# Patient Record
Sex: Female | Born: 1950
Health system: Southern US, Community
[De-identification: ages and names within clinical notes are randomized; demographics above are authoritative.]

## PROBLEM LIST (undated history)

## (undated) DIAGNOSIS — M8700 Idiopathic aseptic necrosis of unspecified bone: Secondary | ICD-10-CM

## (undated) DIAGNOSIS — L97509 Non-pressure chronic ulcer of other part of unspecified foot with unspecified severity: Secondary | ICD-10-CM

## (undated) DIAGNOSIS — D649 Anemia, unspecified: Secondary | ICD-10-CM

## (undated) DIAGNOSIS — M19071 Primary osteoarthritis, right ankle and foot: Secondary | ICD-10-CM

## (undated) DIAGNOSIS — H25013 Cortical age-related cataract, bilateral: Secondary | ICD-10-CM

## (undated) DIAGNOSIS — I2510 Atherosclerotic heart disease of native coronary artery without angina pectoris: Secondary | ICD-10-CM

## (undated) DIAGNOSIS — I10 Essential (primary) hypertension: Secondary | ICD-10-CM

## (undated) DIAGNOSIS — G9340 Encephalopathy, unspecified: Secondary | ICD-10-CM

## (undated) DIAGNOSIS — T8859XA Other complications of anesthesia, initial encounter: Secondary | ICD-10-CM

## (undated) DIAGNOSIS — C50919 Malignant neoplasm of unspecified site of unspecified female breast: Secondary | ICD-10-CM

## (undated) DIAGNOSIS — J42 Unspecified chronic bronchitis: Secondary | ICD-10-CM

## (undated) DIAGNOSIS — Z9889 Other specified postprocedural states: Secondary | ICD-10-CM

## (undated) DIAGNOSIS — Z720 Tobacco use: Secondary | ICD-10-CM

## (undated) DIAGNOSIS — R609 Edema, unspecified: Secondary | ICD-10-CM

## (undated) DIAGNOSIS — J45909 Unspecified asthma, uncomplicated: Secondary | ICD-10-CM

## (undated) DIAGNOSIS — R911 Solitary pulmonary nodule: Secondary | ICD-10-CM

## (undated) DIAGNOSIS — L03031 Cellulitis of right toe: Secondary | ICD-10-CM

## (undated) DIAGNOSIS — A499 Bacterial infection, unspecified: Secondary | ICD-10-CM

## (undated) DIAGNOSIS — H50611 Brown's sheath syndrome, right eye: Secondary | ICD-10-CM

## (undated) DIAGNOSIS — R7303 Prediabetes: Secondary | ICD-10-CM

## (undated) DIAGNOSIS — I739 Peripheral vascular disease, unspecified: Secondary | ICD-10-CM

## (undated) DIAGNOSIS — N200 Calculus of kidney: Secondary | ICD-10-CM

## (undated) DIAGNOSIS — G629 Polyneuropathy, unspecified: Secondary | ICD-10-CM

## (undated) DIAGNOSIS — E785 Hyperlipidemia, unspecified: Secondary | ICD-10-CM

## (undated) DIAGNOSIS — R112 Nausea with vomiting, unspecified: Secondary | ICD-10-CM

## (undated) DIAGNOSIS — M2010 Hallux valgus (acquired), unspecified foot: Secondary | ICD-10-CM

## (undated) DIAGNOSIS — M8580 Other specified disorders of bone density and structure, unspecified site: Secondary | ICD-10-CM

## (undated) DIAGNOSIS — I493 Ventricular premature depolarization: Secondary | ICD-10-CM

## (undated) DIAGNOSIS — G5600 Carpal tunnel syndrome, unspecified upper limb: Secondary | ICD-10-CM

## (undated) DIAGNOSIS — N390 Urinary tract infection, site not specified: Secondary | ICD-10-CM

## (undated) DIAGNOSIS — J449 Chronic obstructive pulmonary disease, unspecified: Secondary | ICD-10-CM

## (undated) DIAGNOSIS — R7611 Nonspecific reaction to tuberculin skin test without active tuberculosis: Secondary | ICD-10-CM

## (undated) DIAGNOSIS — M2042 Other hammer toe(s) (acquired), left foot: Secondary | ICD-10-CM

## (undated) HISTORY — PX: HIP FRACTURE SURGERY: SHX118

## (undated) HISTORY — DX: Malignant neoplasm of unspecified site of unspecified female breast: C50.919

## (undated) HISTORY — DX: Bacterial infection, unspecified: A49.9

## (undated) HISTORY — PX: DIAGNOSTIC LAPAROSCOPY: SUR761

---
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
## 1993-12-22 HISTORY — PX: OTHER SURGICAL HISTORY: SHX169

## 2000-12-22 HISTORY — PX: BONE EXOSTOSIS EXCISION: SHX1249

## 2001-12-22 DIAGNOSIS — C50919 Malignant neoplasm of unspecified site of unspecified female breast: Secondary | ICD-10-CM

## 2001-12-22 HISTORY — PX: BREAST LUMPECTOMY WITH SENTINEL LYMPH NODE BIOPSY: SHX5597

## 2001-12-22 HISTORY — DX: Malignant neoplasm of unspecified site of unspecified female breast: C50.919

## 2001-12-22 HISTORY — PX: MASTECTOMY, PARTIAL: SHX709

## 2004-12-22 HISTORY — PX: ROTATOR CUFF REPAIR: SHX139

## 2006-10-15 ENCOUNTER — Other Ambulatory Visit: Payer: Self-pay

## 2006-10-15 ENCOUNTER — Emergency Department: Payer: Self-pay | Admitting: Emergency Medicine

## 2008-06-11 IMAGING — CR DG RIBS 2V*R*
1 series · 5 of 5 positions shown · non-contrast
Comparison: none

REASON FOR EXAM: Pain x 3 weeks
COMMENTS:

[Series 1: view not recorded · 0.17mm/px · 5 of 5 slices shown]
[im 1/5]
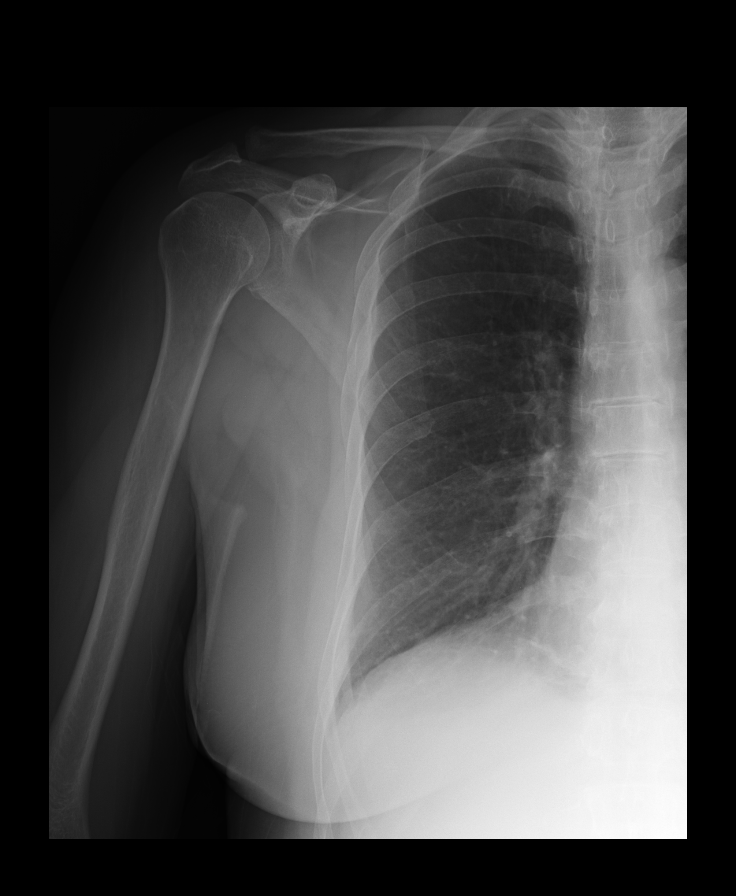
[im 2/5]
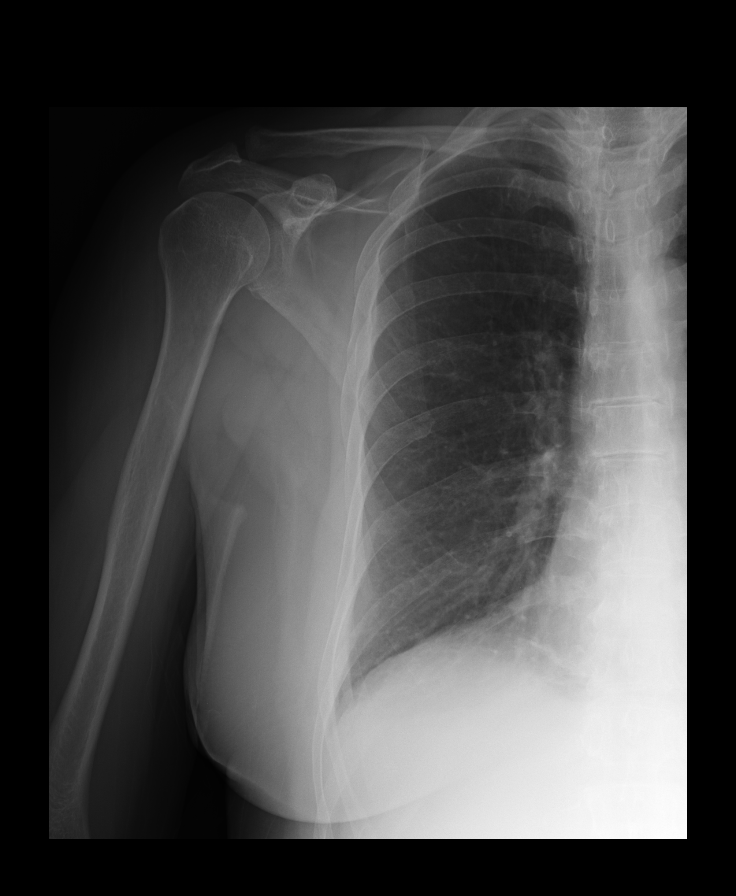
[im 3/5]
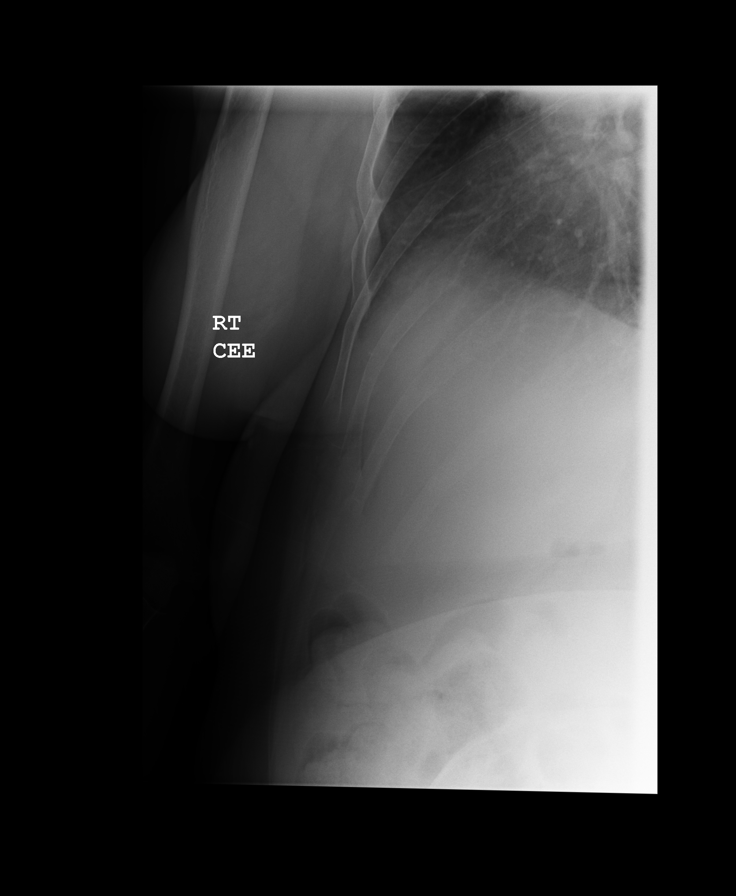
[im 4/5]
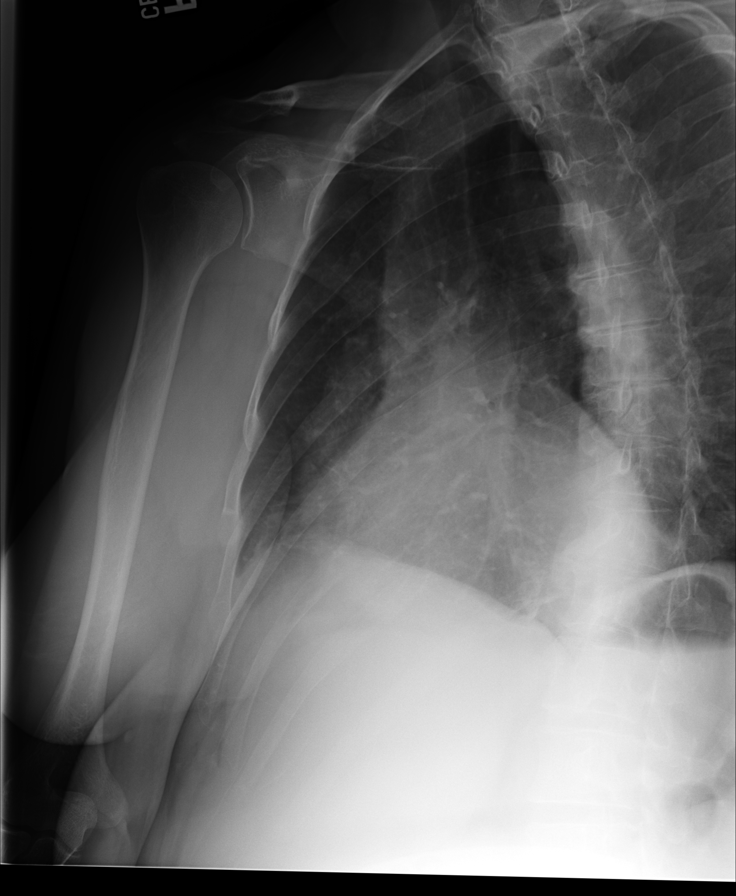
[im 5/5]
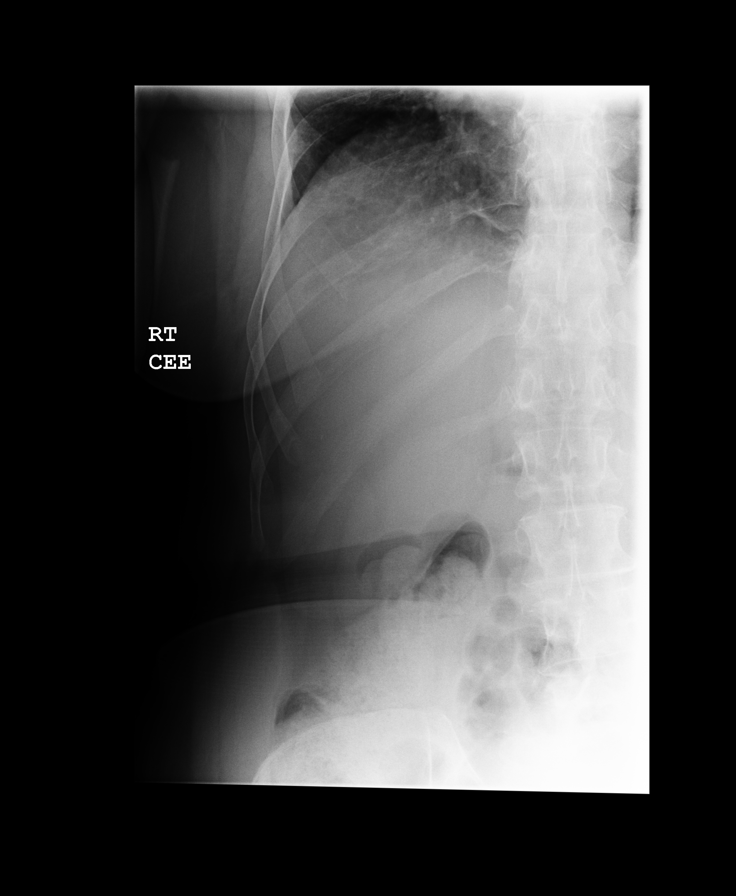

[5 of 5 positions shown; findings below may reference images not displayed]

PROCEDURE:     DXR - DXR RIBS RIGHT UNILATERAL  - October 15, 2006  [DATE]

RESULT:        Four views of the RIGHT ribs were obtained.  There is
deformity of the fifth RIGHT rib laterally.  The appearance is suspicious
for a fracture but could be old.  This is seen in only one view.
Correlation with clinical findings is needed.  No pneumothorax is observed.
The RIGHT ribs otherwise are normal in appearance.
IMPRESSION: Possible fracture of the fifth RIGHT rib.

## 2008-12-22 HISTORY — PX: ROTATOR CUFF REPAIR: SHX139

## 2013-05-11 HISTORY — PX: GANGLION CYST EXCISION: SHX1691

## 2013-08-16 HISTORY — PX: COLONOSCOPY: SHX174

## 2015-02-28 HISTORY — PX: TOE AMPUTATION: SHX809

## 2015-12-23 HISTORY — PX: ANKLE FUSION: SHX881

## 2015-12-27 HISTORY — PX: ANKLE FUSION: SHX881

## 2016-01-23 HISTORY — PX: CATARACT EXTRACTION W/ INTRAOCULAR LENS IMPLANT: SHX1309

## 2016-02-06 HISTORY — PX: CATARACT EXTRACTION W/ INTRAOCULAR LENS IMPLANT: SHX1309

## 2017-03-23 HISTORY — PX: COLONOSCOPY: SHX174

## 2017-08-26 ENCOUNTER — Ambulatory Visit
Admission: EM | Admit: 2017-08-26 | Discharge: 2017-08-26 | Disposition: A | Discharge status: Home / Self Care | Payer: Medicare Other | Attending: Family Medicine | Admitting: Family Medicine

## 2017-08-26 DIAGNOSIS — R319 Hematuria, unspecified: Secondary | ICD-10-CM | POA: Insufficient documentation

## 2017-08-26 DIAGNOSIS — Z87440 Personal history of urinary (tract) infections: Secondary | ICD-10-CM | POA: Insufficient documentation

## 2017-08-26 DIAGNOSIS — Z880 Allergy status to penicillin: Secondary | ICD-10-CM | POA: Diagnosis not present

## 2017-08-26 DIAGNOSIS — F17210 Nicotine dependence, cigarettes, uncomplicated: Secondary | ICD-10-CM | POA: Insufficient documentation

## 2017-08-26 DIAGNOSIS — Z79899 Other long term (current) drug therapy: Secondary | ICD-10-CM | POA: Diagnosis not present

## 2017-08-26 DIAGNOSIS — N390 Urinary tract infection, site not specified: Secondary | ICD-10-CM | POA: Insufficient documentation

## 2017-08-26 HISTORY — DX: Idiopathic aseptic necrosis of unspecified bone: M87.00

## 2017-08-26 HISTORY — DX: Urinary tract infection, site not specified: N39.0

## 2017-08-26 HISTORY — DX: Unspecified asthma, uncomplicated: J45.909

## 2017-08-26 LAB — URINALYSIS, COMPLETE (UACMP) WITH MICROSCOPIC

## 2017-08-26 MED ORDER — SULFAMETHOXAZOLE-TRIMETHOPRIM 800-160 MG PO TABS: 1.0000 | ORAL_TABLET | Freq: Two times a day (BID) | ORAL | 0 refills | Status: DC

## 2017-08-26 NOTE — ED Provider Notes (Signed)
[facility]    CSN: [id number] Arrival date & time:[id number] 08/26/17  1031     History   Chief Complaint Chief Complaint  Patient presents with  . Hematuria    HPI [name] is a 66 y.o. female.   The history is provided by the patient.  Hematuria  This is a new problem. The current episode started more than 2 days ago. The problem occurs constantly. The problem has not changed since onset.Pertinent negatives include no chest pain, no abdominal pain, no headaches and no shortness of breath. Associated symptoms comments: Denies fevers, chills, pain, vomiting.    Past Medical History:  Diagnosis Date  . Asthma   . 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 Frequent UTI     There are no active problems to display for this patient.   Past Surgical History:  Procedure Laterality Date  . ANKLE FUSION Right 2017  . HIP FRACTURE SURGERY Bilateral   . MASTECTOMY, PARTIAL Left 2003  . ROTATOR CUFF REPAIR Left    X 2  . TONSILLECTOMY      OB History    No data available       Home Medications    Prior to Admission medications   Medication Sig Start Date End Date Taking? Authorizing Provider  acetaminophen (TYLENOL) 500 MG tablet Take 1,000 mg by mouth every 6 (six) hours as needed.   Yes [provider]  albuterol (PROVENTIL HFA;VENTOLIN HFA) 108 (90 Base) MCG/ACT inhaler Inhale into the lungs every 6 (six) hours as needed for wheezing or shortness of breath.   Yes [provider]  budesonide-formoterol (SYMBICORT) 160-4.5 MCG/ACT inhaler Inhale 2 puffs into the lungs 2 (two) times daily.   Yes [provider]  montelukast (SINGULAIR) 10 MG tablet Take 10 mg by mouth at bedtime.   Yes [provider]  Multiple Vitamin (MULTIVITAMIN WITH MINERALS) TABS tablet Take 1 tablet by mouth daily.   Yes [provider]  naproxen (NAPROSYN) 500 MG tablet Take 500 mg by mouth 2 (two) times daily with a meal.    Yes [provider]  tiotropium (SPIRIVA) 18 MCG inhalation capsule Place 18 mcg into inhaler and inhale daily.   Yes [provider]  venlafaxine XR (EFFEXOR-XR) 150 MG 24 hr capsule Take 150 mg by mouth daily with breakfast.   Yes [provider]  sulfamethoxazole-trimethoprim (BACTRIM DS,SEPTRA DS) 800-160 MG tablet Take 1 tablet by mouth 2 (two) times daily. 08/26/17   Payton Mccallumonty, Florencio Hollibaugh, MD    Family History Family History  Problem Relation Age of Onset  . Adopted: Yes    Social History Social History  Substance Use Topics  . Smoking status: Current Every Day Smoker    Packs/day: 1.00  . Smokeless tobacco: Never Used  . Alcohol use No     Allergies   Oxycodone; Penicillins; and Vicodin [hydrocodone-acetaminophen]   Review of Systems Review of Systems  Respiratory: Negative for shortness of breath.   Cardiovascular: Negative for chest pain.  Gastrointestinal: Negative for abdominal pain.  Genitourinary: Positive for hematuria.  Neurological: Negative for headaches.     Physical Exam Triage Vital Signs ED Triage Vitals  Enc Vitals Group     BP 08/26/17 1155 (!) 153/96     Pulse Rate 08/26/17 1155 73     Resp 08/26/17 1155 18     Temp 08/26/17 1155 97.8 F (36.6 C)     Temp Source 08/26/17 1155  Oral     SpO2 08/26/17 1155 98 %     Weight 08/26/17 1154 153 lb (69.4 kg)     Height 08/26/17 1154 5\' 3"  (1.6 m)     Head Circumference --      Peak Flow --      Pain Score 08/26/17 1154 0     Pain Loc --      Pain Edu? --      Excl. in GC? --    No data found.   Updated Vital Signs BP (!) 153/96 (BP Location: Left Arm)   Pulse 73   Temp 97.8 F (36.6 C) (Oral)   Resp 18   Ht 5\' 3"  (1.6 m)   Wt 153 lb (69.4 kg)   SpO2 98%   BMI 27.10 kg/m   Visual Acuity Right Eye Distance:   Left Eye Distance:   Bilateral Distance:    Right Eye Near:   Left Eye Near:    Bilateral Near:     Physical Exam  Constitutional: She appears  well-developed and well-nourished. No distress.  Abdominal: Soft. Bowel sounds are normal. She exhibits no distension and no mass. There is no tenderness. There is no rebound and no guarding. No hernia.  Skin: She is not diaphoretic.  Nursing note and vitals reviewed.    UC Treatments / Results  Labs (all labs ordered are listed, but only abnormal results are displayed) Labs Reviewed  URINALYSIS, COMPLETE (UACMP) WITH MICROSCOPIC - Abnormal; Notable for the following:       Result Value   Color, Urine RED (*)    APPearance CLOUDY (*)    Glucose, UA   (*)    Value: TEST NOT REPORTED DUE TO COLOR INTERFERENCE OF URINE PIGMENT   Hgb urine dipstick   (*)    Value: TEST NOT REPORTED DUE TO COLOR INTERFERENCE OF URINE PIGMENT   Bilirubin Urine   (*)    Value: TEST NOT REPORTED DUE TO COLOR INTERFERENCE OF URINE PIGMENT   Ketones, ur   (*)    Value: TEST NOT REPORTED DUE TO COLOR INTERFERENCE OF URINE PIGMENT   Protein, ur   (*)    Value: TEST NOT REPORTED DUE TO COLOR INTERFERENCE OF URINE PIGMENT   Nitrite   (*)    Value: TEST NOT REPORTED DUE TO COLOR INTERFERENCE OF URINE PIGMENT   Leukocytes, UA   (*)    Value: TEST NOT REPORTED DUE TO COLOR INTERFERENCE OF URINE PIGMENT   Squamous Epithelial / LPF 0-5 (*)    Bacteria, UA FEW (*)    All other components within normal limits  URINE CULTURE    EKG  EKG Interpretation None       Radiology No results found.  Procedures Procedures (including critical care time)  Medications Ordered in UC Medications - No data to display   Initial Impression / Assessment and Plan / UC Course  I have reviewed the triage vital signs and the nursing notes.  Pertinent labs & imaging results that were available during my care of the patient were reviewed by me and considered in my medical decision making (see chart for details).       Final Clinical Impressions(s) / UC Diagnoses   Final diagnoses:  Urinary tract infection with  hematuria, site unspecified    New Prescriptions Discharge Medication List as of 08/26/2017 12:48 PM    START taking these medications   Details  sulfamethoxazole-trimethoprim (BACTRIM DS,SEPTRA DS) 800-160 MG tablet Take  1 tablet by mouth 2 (two) times daily., Starting Wed 08/26/2017, Normal       1.Lab results and diagnosis reviewed with patient 2. rx as per orders above; reviewed possible side effects, interactions, risks and benefits  3. Recommend supportive treatment with increased fluids 4. Follow-up prn if symptoms worsen or don't improve  Controlled Substance Prescriptions [place] Controlled Substance Registry consulted? Not Applicable   Payton Mccallum, MD 08/26/17 1550

## 2017-08-26 NOTE — ED Triage Notes (Signed)
Patient complains of hematuria that started 3-4 days ago. Patient states that she has not had any pain or frequency.

## 2017-12-22 DIAGNOSIS — I214 Non-ST elevation (NSTEMI) myocardial infarction: Secondary | ICD-10-CM

## 2017-12-22 HISTORY — DX: Non-ST elevation (NSTEMI) myocardial infarction: I21.4

## 2018-11-21 DIAGNOSIS — A419 Sepsis, unspecified organism: Secondary | ICD-10-CM

## 2018-11-21 HISTORY — DX: Sepsis, unspecified organism: A41.9

## 2018-12-19 HISTORY — PX: CYSTOURETHROSCOPY: SHX476

## 2019-02-28 HISTORY — PX: CARDIAC CATHETERIZATION: SHX172

## 2023-01-06 ENCOUNTER — Inpatient Hospital Stay: Payer: Medicare Other

## 2023-01-06 ENCOUNTER — Encounter (HOSPITAL_COMMUNITY): Payer: Self-pay

## 2023-01-06 ENCOUNTER — Ambulatory Visit (INDEPENDENT_AMBULATORY_CARE_PROVIDER_SITE_OTHER): Payer: Medicare Other | Admitting: Podiatry

## 2023-01-06 ENCOUNTER — Inpatient Hospital Stay
Admission: AD | Admit: 2023-01-06 | Discharge: 2023-01-12 | DRG: 253 | Disposition: A | Discharge status: Home / Self Care | Payer: Medicare Other | Source: Ambulatory Visit | Attending: Internal Medicine | Admitting: Internal Medicine

## 2023-01-06 ENCOUNTER — Ambulatory Visit (INDEPENDENT_AMBULATORY_CARE_PROVIDER_SITE_OTHER): Payer: Medicare Other

## 2023-01-06 ENCOUNTER — Encounter: Payer: Self-pay | Admitting: Family Medicine

## 2023-01-06 ENCOUNTER — Other Ambulatory Visit: Payer: Self-pay

## 2023-01-06 DIAGNOSIS — J4489 Other specified chronic obstructive pulmonary disease: Secondary | ICD-10-CM | POA: Diagnosis present

## 2023-01-06 DIAGNOSIS — E785 Hyperlipidemia, unspecified: Secondary | ICD-10-CM | POA: Diagnosis not present

## 2023-01-06 DIAGNOSIS — Z923 Personal history of irradiation: Secondary | ICD-10-CM

## 2023-01-06 DIAGNOSIS — L03031 Cellulitis of right toe: Secondary | ICD-10-CM

## 2023-01-06 DIAGNOSIS — L97519 Non-pressure chronic ulcer of other part of right foot with unspecified severity: Secondary | ICD-10-CM | POA: Diagnosis not present

## 2023-01-06 DIAGNOSIS — L02611 Cutaneous abscess of right foot: Secondary | ICD-10-CM | POA: Diagnosis not present

## 2023-01-06 DIAGNOSIS — Z853 Personal history of malignant neoplasm of breast: Secondary | ICD-10-CM | POA: Diagnosis not present

## 2023-01-06 DIAGNOSIS — L03115 Cellulitis of right lower limb: Secondary | ICD-10-CM | POA: Diagnosis present

## 2023-01-06 DIAGNOSIS — F17210 Nicotine dependence, cigarettes, uncomplicated: Secondary | ICD-10-CM | POA: Diagnosis present

## 2023-01-06 DIAGNOSIS — Z880 Allergy status to penicillin: Secondary | ICD-10-CM

## 2023-01-06 DIAGNOSIS — J449 Chronic obstructive pulmonary disease, unspecified: Secondary | ICD-10-CM | POA: Diagnosis not present

## 2023-01-06 DIAGNOSIS — Z7902 Long term (current) use of antithrombotics/antiplatelets: Secondary | ICD-10-CM

## 2023-01-06 DIAGNOSIS — I70235 Atherosclerosis of native arteries of right leg with ulceration of other part of foot: Principal | ICD-10-CM | POA: Diagnosis present

## 2023-01-06 DIAGNOSIS — I2510 Atherosclerotic heart disease of native coronary artery without angina pectoris: Secondary | ICD-10-CM | POA: Diagnosis present

## 2023-01-06 DIAGNOSIS — Z87440 Personal history of urinary (tract) infections: Secondary | ICD-10-CM

## 2023-01-06 DIAGNOSIS — I739 Peripheral vascular disease, unspecified: Secondary | ICD-10-CM | POA: Diagnosis not present

## 2023-01-06 DIAGNOSIS — L97518 Non-pressure chronic ulcer of other part of right foot with other specified severity: Secondary | ICD-10-CM | POA: Diagnosis present

## 2023-01-06 DIAGNOSIS — I771 Stricture of artery: Secondary | ICD-10-CM | POA: Diagnosis present

## 2023-01-06 DIAGNOSIS — F32A Depression, unspecified: Secondary | ICD-10-CM | POA: Diagnosis present

## 2023-01-06 DIAGNOSIS — I10 Essential (primary) hypertension: Secondary | ICD-10-CM

## 2023-01-06 DIAGNOSIS — I70202 Unspecified atherosclerosis of native arteries of extremities, left leg: Secondary | ICD-10-CM | POA: Diagnosis present

## 2023-01-06 DIAGNOSIS — Z981 Arthrodesis status: Secondary | ICD-10-CM

## 2023-01-06 DIAGNOSIS — M19071 Primary osteoarthritis, right ankle and foot: Secondary | ICD-10-CM | POA: Diagnosis present

## 2023-01-06 DIAGNOSIS — Z885 Allergy status to narcotic agent status: Secondary | ICD-10-CM

## 2023-01-06 DIAGNOSIS — Z5189 Encounter for other specified aftercare: Secondary | ICD-10-CM

## 2023-01-06 DIAGNOSIS — R519 Headache, unspecified: Secondary | ICD-10-CM | POA: Diagnosis not present

## 2023-01-06 DIAGNOSIS — G629 Polyneuropathy, unspecified: Secondary | ICD-10-CM | POA: Diagnosis present

## 2023-01-06 DIAGNOSIS — Z87442 Personal history of urinary calculi: Secondary | ICD-10-CM | POA: Diagnosis not present

## 2023-01-06 DIAGNOSIS — Z7982 Long term (current) use of aspirin: Secondary | ICD-10-CM

## 2023-01-06 HISTORY — DX: Chronic obstructive pulmonary disease, unspecified: J44.9

## 2023-01-06 HISTORY — DX: Atherosclerotic heart disease of native coronary artery without angina pectoris: I25.10

## 2023-01-06 HISTORY — DX: Calculus of kidney: N20.0

## 2023-01-06 LAB — COMPREHENSIVE METABOLIC PANEL
ALT: 17 U/L (ref 0–44)
AST: 19 U/L (ref 15–41)
Albumin: 3.7 g/dL (ref 3.5–5.0)
Alkaline Phosphatase: 89 U/L (ref 38–126)
Anion gap: 9 (ref 5–15)
BUN: 19 mg/dL (ref 8–23)
CO2: 24 mmol/L (ref 22–32)
Calcium: 9 mg/dL (ref 8.9–10.3)
Chloride: 105 mmol/L (ref 98–111)
Creatinine, Ser: 0.86 mg/dL (ref 0.44–1.00)
GFR, Estimated: >60 mL/min (ref >60)
Glucose, Bld: 129 mg/dL — ABNORMAL HIGH (ref 70–99)
Potassium: 3.6 mmol/L (ref 3.5–5.1)
Sodium: 138 mmol/L (ref 135–145)
Total Bilirubin: 0.7 mg/dL (ref 0.3–1.2)
Total Protein: 7.5 g/dL (ref 6.5–8.1)

## 2023-01-06 LAB — CBC WITH DIFFERENTIAL/PLATELET
Abs Immature Granulocytes: 0.02 10*3/uL (ref 0.00–0.07)
Basophils Absolute: 0.1 10*3/uL (ref 0.0–0.1)
Basophils Relative: 1 %
Eosinophils Absolute: 0.3 10*3/uL (ref 0.0–0.5)
Eosinophils Relative: 4 %
HCT: 42.5 % (ref 36.0–46.0)
Hemoglobin: 13.9 g/dL (ref 12.0–15.0)
Immature Granulocytes: 0 %
Lymphocytes Relative: 26 %
Lymphs Abs: 2.1 10*3/uL (ref 0.7–4.0)
MCH: 27.9 pg (ref 26.0–34.0)
MCHC: 32.7 g/dL (ref 30.0–36.0)
MCV: 85.3 fL (ref 80.0–100.0)
Monocytes Absolute: 1 10*3/uL (ref 0.1–1.0)
Monocytes Relative: 12 %
Neutro Abs: 4.8 10*3/uL (ref 1.7–7.7)
Neutrophils Relative %: 57 %
Platelets: 289 10*3/uL (ref 150–400)
RBC: 4.98 MIL/uL (ref 3.87–5.11)
RDW: 13.2 % (ref 11.5–15.5)
WBC: 8.2 10*3/uL (ref 4.0–10.5)
nRBC: 0 % (ref 0.0–0.2)

## 2023-01-06 LAB — APTT: aPTT: 29 seconds (ref 24–36)

## 2023-01-06 LAB — PROTIME-INR
INR: 1.1 (ref 0.8–1.2)
Prothrombin Time: 13.7 seconds (ref 11.4–15.2)

## 2023-01-06 MED ORDER — ADULT MULTIVITAMIN W/MINERALS CH
1.0000 | ORAL_TABLET | Freq: Every day | ORAL | Status: DC
  Administered 2023-01-07 – 2023-01-12 (×6): 1 via ORAL
  Filled 2023-01-06 (×6): qty 1

## 2023-01-06 MED ORDER — SODIUM CHLORIDE 0.9 % IV SOLN
1.0000 g | INTRAVENOUS | Status: DC
  Filled 2023-01-06: qty 10

## 2023-01-06 MED ORDER — IPRATROPIUM-ALBUTEROL 0.5-2.5 (3) MG/3ML IN SOLN: 3.0000 mL | Freq: Four times a day (QID) | RESPIRATORY_TRACT | Status: DC | PRN

## 2023-01-06 MED ORDER — ALBUTEROL SULFATE (2.5 MG/3ML) 0.083% IN NEBU: 2.5000 mg | INHALATION_SOLUTION | Freq: Four times a day (QID) | RESPIRATORY_TRACT | Status: DC | PRN

## 2023-01-06 MED ORDER — ACETAMINOPHEN 325 MG PO TABS
650.0000 mg | ORAL_TABLET | Freq: Four times a day (QID) | ORAL | Status: DC | PRN
  Administered 2023-01-07 – 2023-01-12 (×6): 650 mg via ORAL
  Filled 2023-01-06 (×6): qty 2

## 2023-01-06 MED ORDER — VANCOMYCIN HCL IN DEXTROSE 1-5 GM/200ML-% IV SOLN
1000.0000 mg | Freq: Once | INTRAVENOUS | Status: DC
  Filled 2023-01-06: qty 200

## 2023-01-06 MED ORDER — ONDANSETRON HCL 4 MG PO TABS
4.0000 mg | ORAL_TABLET | Freq: Four times a day (QID) | ORAL | Status: DC | PRN
  Administered 2023-01-10: 4 mg via ORAL
  Filled 2023-01-06 (×2): qty 1

## 2023-01-06 MED ORDER — VENLAFAXINE HCL ER 150 MG PO CP24
150.0000 mg | ORAL_CAPSULE | Freq: Every day | ORAL | Status: DC
  Filled 2023-01-06: qty 1

## 2023-01-06 MED ORDER — CARVEDILOL 12.5 MG PO TABS
12.5000 mg | ORAL_TABLET | Freq: Two times a day (BID) | ORAL | Status: DC
  Administered 2023-01-07 – 2023-01-12 (×11): 12.5 mg via ORAL
  Filled 2023-01-06 (×12): qty 1

## 2023-01-06 MED ORDER — VANCOMYCIN HCL 1500 MG/300ML IV SOLN
1500.0000 mg | Freq: Once | INTRAVENOUS | Status: AC
  Administered 2023-01-06: 1500 mg via INTRAVENOUS
  Filled 2023-01-06: qty 300

## 2023-01-06 MED ORDER — ONDANSETRON HCL 4 MG/2ML IJ SOLN
4.0000 mg | Freq: Four times a day (QID) | INTRAMUSCULAR | Status: DC | PRN
  Administered 2023-01-07 (×2): 4 mg via INTRAVENOUS
  Filled 2023-01-06 (×2): qty 2

## 2023-01-06 MED ORDER — MELATONIN 5 MG PO TABS
10.0000 mg | ORAL_TABLET | Freq: Every day | ORAL | Status: DC
  Administered 2023-01-06 – 2023-01-11 (×6): 10 mg via ORAL
  Filled 2023-01-06 (×6): qty 2

## 2023-01-06 MED ORDER — ENOXAPARIN SODIUM 40 MG/0.4ML IJ SOSY
40.0000 mg | PREFILLED_SYRINGE | INTRAMUSCULAR | Status: DC
  Filled 2023-01-06 (×2): qty 0.4

## 2023-01-06 MED ORDER — TRAZODONE HCL 50 MG PO TABS
25.0000 mg | ORAL_TABLET | Freq: Every evening | ORAL | Status: DC | PRN
  Administered 2023-01-07 – 2023-01-11 (×5): 25 mg via ORAL
  Filled 2023-01-06 (×5): qty 1

## 2023-01-06 MED ORDER — ACETAMINOPHEN 650 MG RE SUPP: 650.0000 mg | Freq: Four times a day (QID) | RECTAL | Status: DC | PRN

## 2023-01-06 MED ORDER — DOXYCYCLINE HYCLATE 100 MG PO TABS: 100.0000 mg | ORAL_TABLET | Freq: Two times a day (BID) | ORAL | 0 refills | Status: DC

## 2023-01-06 MED ORDER — AMLODIPINE BESYLATE 10 MG PO TABS
10.0000 mg | ORAL_TABLET | Freq: Every day | ORAL | Status: DC
  Administered 2023-01-06: 10 mg via ORAL
  Filled 2023-01-06 (×2): qty 1

## 2023-01-06 MED ORDER — MAGNESIUM HYDROXIDE 400 MG/5ML PO SUSP: 30.0000 mL | Freq: Every day | ORAL | Status: DC | PRN

## 2023-01-06 MED ORDER — LEVOFLOXACIN IN D5W 750 MG/150ML IV SOLN
750.0000 mg | INTRAVENOUS | Status: DC
  Administered 2023-01-07: 750 mg via INTRAVENOUS
  Filled 2023-01-06 (×2): qty 150

## 2023-01-06 MED ORDER — SODIUM CHLORIDE 0.9 % IV SOLN: INTRAVENOUS | Status: DC

## 2023-01-06 NOTE — H&P (Addendum)
[place]   PATIENT NAME: [name]    MR#:  [id number]  DATE OF BIRTH:  [birth year]  DATE OF ADMISSION:  01/06/2023  PRIMARY CARE PHYSICIAN: Patient, No Pcp Per   Patient is coming from: Home  REQUESTING/REFERRING PHYSICIAN: Trula Slade, DPM   CHIEF COMPLAINT:  Right big toe swelling and redness  HISTORY OF PRESENT ILLNESS:  Delpha Perko is a 72 y.o. female with medical history significant for asthma, COPD, PVD, coronary artery disease, dyslipidemia, peripheral neuropathy and hypertension, who presented to the emergency room with acute onset of right big toe swelling with erythema and ulceration extending to the foot.  The patient had a big blister on her right big toe last Monday that she poked with the needle.  She developed worsening redness and mild tenderness over the last few days.  She developed a temperature of 99.3 yesterday.  Today she saw developing blisters on her small toe.  She admits to mild dyspnea with no worsening.  She denies any worsening cough or wheezing with her COPD and asthma.  No nausea or vomiting or abdominal pain.  No chest pain or palpitations.  No dysuria, oliguria or hematuria or flank pain.  She was evaluated by Dr. Jacqualyn Posey today and he referred her to be directly admitted to [facility].  ED Course: When she came to the ER, temperature was 99.3 and otherwise vital signs were within normal.  Labs are currently pending.  Imaging: Pending.  The patient is directly admitted to the med/surg bed for further evaluation and management. PAST MEDICAL HISTORY:   Past Medical History:  Diagnosis Date   Asthma    AVN (avascular necrosis of bone) ([place])    bilateral hips   Frequent UTI     PAST SURGICAL HISTORY:   Past Surgical History:  Procedure Laterality Date   ANKLE FUSION Right 2017   HIP FRACTURE SURGERY Bilateral    MASTECTOMY, PARTIAL Left 2003   ROTATOR CUFF REPAIR Left    X 2   TONSILLECTOMY      SOCIAL HISTORY:   Social  History   Tobacco Use   Smoking status: Every Day    Packs/day: 1.00    Types: Cigarettes   Smokeless tobacco: Never  Substance Use Topics   Alcohol use: No  She is a retired Marine scientist.  FAMILY HISTORY:   Family History  Adopted: Yes    DRUG ALLERGIES:   Allergies  Allergen Reactions   Oxycodone    Penicillins    Vicodin [Hydrocodone-Acetaminophen]     REVIEW OF SYSTEMS:   ROS As per history of present illness. All pertinent systems were reviewed above. Constitutional, HEENT, cardiovascular, respiratory, GI, GU, musculoskeletal, neuro, psychiatric, endocrine, integumentary and hematologic systems were reviewed and are otherwise negative/unremarkable except for positive findings mentioned above in the HPI.   MEDICATIONS AT HOME:   Prior to Admission medications   Medication Sig Start Date End Date Taking? Authorizing Provider  acetaminophen (TYLENOL) 500 MG tablet Take 1,000 mg by mouth every 6 (six) hours as needed.    [provider]  albuterol (PROVENTIL HFA;VENTOLIN HFA) 108 (90 Base) MCG/ACT inhaler Inhale into the lungs every 6 (six) hours as needed for wheezing or shortness of breath.    [provider]  budesonide-formoterol (SYMBICORT) 160-4.5 MCG/ACT inhaler Inhale 2 puffs into the lungs 2 (two) times daily.    [provider]  doxycycline (VIBRA-TABS) 100 MG tablet Take 1 tablet (100 mg total) by mouth  2 (two) times daily. 01/06/23   Trula Slade, DPM  montelukast (SINGULAIR) 10 MG tablet Take 10 mg by mouth at bedtime.    [provider]  Multiple Vitamin (MULTIVITAMIN WITH MINERALS) TABS tablet Take 1 tablet by mouth daily.    [provider]  naproxen (NAPROSYN) 500 MG tablet Take 500 mg by mouth 2 (two) times daily with a meal.    [provider]  sulfamethoxazole-trimethoprim (BACTRIM DS,SEPTRA DS) 800-160 MG tablet Take 1 tablet by mouth 2 (two) times daily. 08/26/17   Norval Gable, MD  tiotropium  (SPIRIVA) 18 MCG inhalation capsule Place 18 mcg into inhaler and inhale daily.    [provider]  venlafaxine XR (EFFEXOR-XR) 150 MG 24 hr capsule Take 150 mg by mouth daily with breakfast.    [provider]      VITAL SIGNS:  Blood pressure 134/74, pulse 89, temperature 99.3 F (37.4 C), resp. rate 16, SpO2 98 %.  PHYSICAL EXAMINATION:  Physical Exam  GENERAL:  72 y.o.-year-old Caucasian female patient lying in the bed with no acute distress.  EYES: Pupils equal, round, reactive to light and accommodation. No scleral icterus. Extraocular muscles intact.  HEENT: Head atraumatic, normocephalic. Oropharynx and nasopharynx clear.  NECK:  Supple, no jugular venous distention. No thyroid enlargement, no tenderness.  LUNGS: Normal breath sounds bilaterally, no wheezing, rales,rhonchi or crepitation. No use of accessory muscles of respiration.  CARDIOVASCULAR: Regular rate and rhythm, S1, S2 normal. No murmurs, rubs, or gallops.  ABDOMEN: Soft, nondistended, nontender. Bowel sounds present. No organomegaly or mass.  EXTREMITIES: No pedal edema, cyanosis, or clubbing.  NEUROLOGIC: Cranial nerves II through XII are intact. Muscle strength 5/5 in all extremities. Sensation intact. Gait not checked.  PSYCHIATRIC: The patient is alert and oriented x 3.  Normal affect and good eye contact. SKIN: Right big toe no ulceration at the site of the ruptured hemorrhagic blister with surrounding erythema involving the big toe and extending to the right foot with induration and tenderness.     LABORATORY PANEL:   CBC No results for input(s): "WBC", "HGB", "HCT", "PLT" in the last 168 hours. ------------------------------------------------------------------------------------------------------------------  Chemistries  No results for input(s): "NA", "K", "CL", "CO2", "GLUCOSE", "BUN", "CREATININE", "CALCIUM", "MG", "AST", "ALT", "ALKPHOS", "BILITOT" in the last 168 hours.  Invalid  input(s): "GFRCGP" ------------------------------------------------------------------------------------------------------------------  Cardiac Enzymes No results for input(s): "TROPONINI" in the last 168 hours. ------------------------------------------------------------------------------------------------------------------  RADIOLOGY:  No results found.    IMPRESSION AND PLAN:  Assessment and Plan: No notes have been filed under this hospital service. Service: Hospitalist  1.  Right big toe cellulitis, rule out osteomyelitis. - This is associated with right foot cellulitis. - The patient will be admitted to a medical bed. - Will continue antibiotic therapy with IV vancomycin and Rocephin. - Podiatry consult to be obtained. - I notified Dr. Amalia Hailey about the patient. - We will obtain a right knee MRI and right ABI. - We will check labs including CBC CMP and coag profile.  2.  Chronic obstructive pulmonary disease with asthma without exacerbation. - We will continue her Symbicort and montelukast as well as Spiriva.  3.  Essential hypertension. - We will continue amlodipine and Coreg.  4.  Dyslipidemia. - We will continue statin therapy.  5.  Depression. - We will continue Effexor XR.  6.  Peripheral vascular disease. - We will continue her aspirin.  7.  Tobacco use. - She will receive the counseling for smoking cessation.  DVT prophylaxis: Lovenox.  Advanced Care Planning:  Code Status: full code.  This was discussed with her and her daughter. Family Communication:  The plan of care was discussed in details with the patient (and family). I answered all questions. The patient agreed to proceed with the above mentioned plan. Further management will depend upon hospital course. Disposition Plan: Back to previous home environment Consults called: Podiatry All the records are reviewed and case discussed with ED provider.  Status is: Inpatient  At the time of the  admission, it appears that the appropriate admission status for this patient is inpatient.  This is judged to be reasonable and necessary in order to provide the required intensity of service to ensure the patient's safety given the presenting symptoms, physical exam findings and initial radiographic and laboratory data in the context of comorbid conditions.  The patient requires inpatient status due to high intensity of service, high risk of further deterioration and high frequency of surveillance required.  I certify that at the time of admission, it is my clinical judgment that the patient will require inpatient hospital care extending more than 2 midnights.                            Dispo: The patient is from: Home              Anticipated d/c is to: Home              Patient currently is not medically stable to d/c.              Difficult to place patient: No  Hannah Beat M.D on 01/06/2023 at 8:02 PM  [facility]   From 7 PM-7 AM, contact night-coverage [email or web address]  CC: Primary care physician; Patient, No Pcp Per

## 2023-01-06 NOTE — Progress Notes (Signed)
Subjective:   Patient ID: [name], female   DOB: 72 y.o.   MRN: [id number]   HPI Chief Complaint  Patient presents with   Foot Problem    Wound on right great toe, last week     72 year old female presents the office today with above concerns.  Patient states this started about a week ago as a blister that she popped. Her dauhter found out about it over the weekend but she was has not to go to the ER given fear of respiratory viruses.  Patient has noticed swelling and redness to the toe.   Review of Systems  All other systems reviewed and are negative.  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  Allergies  Allergen Reactions   Oxycodone    Penicillins    Vicodin [Hydrocodone-Acetaminophen]            Objective:  Physical Exam  General: AAO x3, NAD  Dermatological: Necrotic.  The medial aspect of the right hallux with edema and erythema surrounding the toe and mild erythema the dorsal aspect of the foot.  There is no drainage or pus.  Subjectively she states that she did notice some pus earlier but not able to visualize this today.  No fluctuance or crepitation.       Vascular: Dorsalis Pedis artery and Posterior Tibial artery pedal pulses are decreased on the right.  Neruologic: Sensation decreased  Musculoskeletal: No significant pain on exam.      Assessment:  Ulceration right foot with cellulitis     Plan:  -Treatment options discussed including all alternatives, risks, and complications -Etiology of symptoms were discussed -X-rays were obtained and reviewed with the patient.  3 views of the foot were obtained.  No  definitive cortical changes of soft tissue edema. -Given extent of the wound, cellulitis I recommend admission to the hospital.  I discussed close emergency room but the patient and her daughter were adamant about going to ER.  I contacted the [facility] and patient was accepted for admission.  I discussed the risks of waiting for bed with the patient and her daughter.  They are very well aware of the risks.  I will go and start antibiotics, doxycycline in case they are not able to get a bed today but there is any changes they need to report to the ER immediately. -Upon admission would recommend MRI, ABI.  We will see the patient once admitted.  50 minutes were spent with the patient assessing the wound and coordinating care  Trula Slade DPM

## 2023-01-06 NOTE — Consult Note (Signed)
Pharmacy Antibiotic Note  [name] is a 72 y.o. female admitted on 01/06/2023 with cellulitis.  Pharmacy has been consulted for cellulitis dosing.  Plan: Give vancomycin 1500 mg IV x1. No available renal function data or documented weight. Spoke with RN who states they will collect one as soon as possible. Notified lab to collect scr. In the meantime will do variable dosing following loading dose. Check random level tomorrow evening Continue to monitor and dose adjust antibiotics according to renal function and indication     Temp (24hrs), Avg:99.3 F (37.4 C), Min:99.3 F (37.4 C), Max:99.3 F (37.4 C)  No results for input(s): "WBC", "CREATININE", "LATICACIDVEN", "VANCOTROUGH", "VANCOPEAK", "VANCORANDOM", "GENTTROUGH", "GENTPEAK", "GENTRANDOM", "TOBRATROUGH", "TOBRAPEAK", "TOBRARND", "AMIKACINPEAK", "AMIKACINTROU", "AMIKACIN" in the last 168 hours.  CrCl cannot be calculated (No successful lab value found.).    Allergies  Allergen Reactions   Oxycodone    Penicillins    Vicodin [Hydrocodone-Acetaminophen]     Antimicrobials this admission: 1/16 vancomycin >>     Microbiology results: 1/16 BCx: sent  Thank you for allowing pharmacy to be a part of this patient's care.  Darrick Penna 01/06/2023 8:42 PM

## 2023-01-07 ENCOUNTER — Encounter: Payer: Self-pay | Admitting: Family Medicine

## 2023-01-07 ENCOUNTER — Ambulatory Visit: Payer: Self-pay | Admitting: Podiatry

## 2023-01-07 ENCOUNTER — Inpatient Hospital Stay: Payer: Medicare Other

## 2023-01-07 DIAGNOSIS — G629 Polyneuropathy, unspecified: Secondary | ICD-10-CM

## 2023-01-07 DIAGNOSIS — L03031 Cellulitis of right toe: Secondary | ICD-10-CM

## 2023-01-07 DIAGNOSIS — I739 Peripheral vascular disease, unspecified: Secondary | ICD-10-CM | POA: Diagnosis not present

## 2023-01-07 LAB — CBC
HCT: 39 % (ref 36.0–46.0)
Hemoglobin: 12.9 g/dL (ref 12.0–15.0)
MCH: 28 pg (ref 26.0–34.0)
MCHC: 33.1 g/dL (ref 30.0–36.0)
MCV: 84.8 fL (ref 80.0–100.0)
Platelets: 283 10*3/uL (ref 150–400)
RBC: 4.6 MIL/uL (ref 3.87–5.11)
RDW: 13.1 % (ref 11.5–15.5)
WBC: 8 10*3/uL (ref 4.0–10.5)
nRBC: 0 % (ref 0.0–0.2)

## 2023-01-07 LAB — BASIC METABOLIC PANEL
Anion gap: 8 (ref 5–15)
BUN: 16 mg/dL (ref 8–23)
CO2: 22 mmol/L (ref 22–32)
Calcium: 8.8 mg/dL — ABNORMAL LOW (ref 8.9–10.3)
Chloride: 106 mmol/L (ref 98–111)
Creatinine, Ser: 0.77 mg/dL (ref 0.44–1.00)
GFR, Estimated: >60 mL/min (ref >60)
Glucose, Bld: 121 mg/dL — ABNORMAL HIGH (ref 70–99)
Potassium: 3.5 mmol/L (ref 3.5–5.1)
Sodium: 136 mmol/L (ref 135–145)

## 2023-01-07 MED ORDER — AMLODIPINE BESYLATE 10 MG PO TABS
10.0000 mg | ORAL_TABLET | Freq: Every day | ORAL | Status: DC
  Administered 2023-01-08 – 2023-01-11 (×4): 10 mg via ORAL
  Filled 2023-01-07 (×5): qty 1

## 2023-01-07 MED ORDER — ASPIRIN 81 MG PO TBEC
81.0000 mg | DELAYED_RELEASE_TABLET | Freq: Every day | ORAL | Status: DC
  Administered 2023-01-07 – 2023-01-11 (×5): 81 mg via ORAL
  Filled 2023-01-07 (×6): qty 1

## 2023-01-07 MED ORDER — VANCOMYCIN HCL 1250 MG/250ML IV SOLN
1250.0000 mg | INTRAVENOUS | Status: DC
  Administered 2023-01-07 – 2023-01-08 (×2): 1250 mg via INTRAVENOUS
  Filled 2023-01-07 (×2): qty 250

## 2023-01-07 NOTE — Consult Note (Signed)
Hospital Consult    Reason for Consult:  Cellulitis and Ulcer right foot Requesting Physician:  Dr Celesta Gentile MD MRN #:  [id number]  History of Present Illness: This is a 72 y.o. female with medical history significant for asthma, COPD, PVD, coronary artery disease, dyslipidemia, peripheral neuropathy and hypertension, who presented to the emergency room with acute onset of right big toe swelling with erythema and ulceration extending to the foot.  She was seen in the podiatry office yesterday as a new patient.  Patient also has a very long history of smoking even though she states she quit 4 years ago. She was also told in the past she was diabetic but she does not agree and therefore eats what she wants. She also states she has had a prior right ankle fusion and it has never been right since. Vascular Surgery was consulted to evaluate her right foot and sore to her great right toe.   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   CAD (coronary artery disease)    COPD (chronic obstructive pulmonary disease) ([place])    Frequent UTI    Kidney stones     Past Surgical History:  Procedure Laterality Date   ANKLE FUSION Right 2017   fibular graft  Bilateral    bilater fibular missing and replaced in hips   HIP FRACTURE SURGERY Bilateral    MASTECTOMY, PARTIAL Left 2003   ROTATOR CUFF REPAIR Left    X 2   TONSILLECTOMY      Allergies  Allergen Reactions   Oxycodone    Penicillins    Vicodin [Hydrocodone-Acetaminophen]     Prior to Admission medications   Medication Sig Start Date End Date Taking? Authorizing Provider  acetaminophen (TYLENOL) 500 MG tablet Take 1,000 mg by mouth at bedtime.   Yes [provider]  albuterol (PROVENTIL HFA;VENTOLIN HFA) 108 (90 Base) MCG/ACT inhaler Inhale 2 puffs into the lungs every 6 (six) hours as needed for wheezing or shortness of breath.   Yes [provider]  amLODipine (NORVASC) 10 MG tablet Take 10 mg by mouth daily.   Yes [provider]  aspirin EC 81 MG tablet Take 81 mg by mouth daily. Swallow whole.   Yes [provider]  carvedilol (COREG) 12.5 MG tablet Take 12.5 mg by mouth 2 (two) times daily with a meal.   Yes [provider]  cetirizine (ZYRTEC) 10 MG chewable tablet Chew 10 mg by mouth daily.   Yes [provider]  Melatonin 10 MG TABS Take by mouth at bedtime.   Yes [provider]  Multiple Vitamin (MULTIVITAMIN WITH MINERALS) TABS tablet Take 1 tablet by mouth daily.   Yes [provider]  budesonide-formoterol (SYMBICORT) 160-4.5 MCG/ACT inhaler Inhale 2 puffs into the lungs 2 (two) times daily. Patient not taking: Reported on 01/07/2023    [provider]  doxycycline (VIBRA-TABS) 100 MG tablet Take 1 tablet (100 mg total) by mouth 2 (two) times daily. Patient not taking: Reported on 01/07/2023 01/06/23   Trula Slade, DPM  montelukast (SINGULAIR) 10 MG tablet Take 10 mg by mouth at bedtime. Patient not taking: Reported on 01/06/2023    [provider]  naproxen (NAPROSYN) 500 MG tablet Take 500 mg by mouth 2 (two) times daily with a meal. Patient not taking: Reported on 01/06/2023    [provider]  sulfamethoxazole-trimethoprim (BACTRIM DS,SEPTRA  DS) 800-160 MG tablet Take 1 tablet by mouth 2 (two) times daily. Patient not taking: Reported on 01/06/2023 08/26/17   Payton Mccallum, MD  tiotropium (SPIRIVA) 18 MCG inhalation capsule Place 18 mcg into inhaler and inhale daily. Patient not taking: Reported on 01/06/2023    [provider]  venlafaxine XR (EFFEXOR-XR) 150 MG 24 hr capsule Take 150 mg by mouth daily with breakfast. Patient not taking: Reported on 01/06/2023    [provider]    Social History   Socioeconomic History   Marital status: Single    Spouse name: Not on file   Number of children: Not on file   Years of  education: Not on file   Highest education level: Not on file  Occupational History   Not on file  Tobacco Use   Smoking status: Former    Packs/day: 1.00    Types: Cigarettes   Smokeless tobacco: Never  Vaping Use   Vaping Use: Never used  Substance and Sexual Activity   Alcohol use: No   Drug use: No   Sexual activity: Not on file  Other Topics Concern   Not on file  Social History Narrative   Not on file   Social Determinants of Health   Financial Resource Strain: Not on file  Food Insecurity: No Food Insecurity (01/06/2023)   Hunger Vital Sign    Worried About Running Out of Food in the Last Year: Never true    Ran Out of Food in the Last Year: Never true  Transportation Needs: No Transportation Needs (01/06/2023)   PRAPARE - Administrator, Civil Service (Medical): No    Lack of Transportation (Non-Medical): No  Physical Activity: Not on file  Stress: Not on file  Social Connections: Not on file  Intimate Partner Violence: Not At Risk (01/06/2023)   Humiliation, Afraid, Rape, and Kick questionnaire    Fear of Current or Ex-Partner: No    Emotionally Abused: No    Physically Abused: No    Sexually Abused: No     Family History  Adopted: Yes    ROS: Otherwise negative unless mentioned in HPI  Physical Examination  Vitals:   01/07/23 0020 01/07/23 0758  BP: 124/70 113/64  Pulse: 81 73  Resp: 18 16  Temp: 97.6 F (36.4 C) 98.4 F (36.9 C)  SpO2: 98% 98%   Body mass index is 28.81 kg/m.  General:  WDWN in NAD Gait: Not observed HENT: WNL, normocephalic Pulmonary: normal non-labored breathing, without Rales, rhonchi,  wheezing Cardiac: regular, without  Murmurs, rubs or gallops; without carotid bruits Abdomen: positive bowel sounds,  soft, NT/ND, no masses Skin: without rashes Vascular Exam/Pulses: POST Tib Pulses bilat are palpable, DP pulse on the right is weak.  Extremities: with ischemic changes, without Gangrene , with cellulitis;  with open wounds;  Musculoskeletal: no muscle wasting or atrophy  Neurologic: A&O X 3;  No focal weakness or paresthesias are detected; speech is fluent/normal Psychiatric:  The pt has Normal affect. Lymph:  Unremarkable  CBC    Component Value Date/Time   WBC 8.0 01/07/2023 0432   RBC 4.60 01/07/2023 0432   HGB 12.9 01/07/2023 0432   HCT 39.0 01/07/2023 0432   PLT 283 01/07/2023 0432   MCV 84.8 01/07/2023 0432   MCH 28.0 01/07/2023 0432   MCHC 33.1 01/07/2023 0432   RDW 13.1 01/07/2023 0432   LYMPHSABS 2.1 01/06/2023 2309   MONOABS 1.0 01/06/2023 2309   EOSABS 0.3 01/06/2023 2309  BASOSABS 0.1 01/06/2023 2309    BMET    Component Value Date/Time   NA 136 01/07/2023 0432   K 3.5 01/07/2023 0432   CL 106 01/07/2023 0432   CO2 22 01/07/2023 0432   GLUCOSE 121 (H) 01/07/2023 0432   BUN 16 01/07/2023 0432   CREATININE 0.77 01/07/2023 0432   CALCIUM 8.8 (L) 01/07/2023 0432   GFRNONAA >60 01/07/2023 0432    COAGS: Lab Results  Component Value Date   INR 1.1 01/06/2023     Non-Invasive Vascular Imaging:   EXAM: MRI OF THE RIGHT FOREFOOT WITHOUT CONTRAS  IMPRESSION: 1. Soft tissue edema along the dorsal lateral aspect of the foot concerning for cellulitis. No drainable fluid collection to suggest an abscess. 2. No osteomyelitis of the right forefoot. 3. Severe osteoarthritis of the talonavicular joint. 4. Moderate osteoarthritis of the fourth TMT joint. 5. Mild osteoarthritis of the calcaneocuboid joint.  Statin:  No. Beta Blocker:  Yes.   Aspirin:  Yes.   ACEI:  No. ARB:  No. CCB use:  Yes Other antiplatelets/anticoagulants:  No.    ASSESSMENT/PLAN: This is a 72 y.o. female who presents to the emergency room with acute onset of right big toe swelling with erythema and ulceration extending to the foot.  She was seen in the podiatrist office yesterday as a new patient and given the infection recommended she be admitted to the hospital.  Upon exam today I  agree with podiatry's findings and the findings related to the MRI of her right forefoot.  She is noted to have severe osteoarthritis of the talonavicular joint with moderate osteoarthritis of the fourth TMT joint and mild osteoarthritis of the calcaneocuboid joint.   Plan:   Vascular surgery recommends a right lower extremity angiogram to determine blood flow.  I discussed the procedure with the patient in detail.  We discussed the procedure itself, risks, benefits, and complications.  Patient asked multiple questions and I answered them.  She verbalizes her understanding.  She states she needs to speak with her daughter before consenting but most likely will proceed with the procedure.  Therefore I will place her on the vascular schedule for right lower extremity angiogram on Friday with Dr. Ella Jubilee.      Drema Pry [facility] 01/07/2023 3:49 PM

## 2023-01-07 NOTE — TOC Progression Note (Signed)
Transition of Care [facility]) - Progression Note    Patient Details  Name: [name] MRN: [id number] Date of Birth: May 21, 1951  Transition of Care [facility]) CM/SW [place], RN Phone Number: 01/07/2023, 9:04 AM  Clinical Narrative:   TOC will continue to follow the patient and assist with needs    Expected Discharge Plan: Home/Self Care Barriers to Discharge: Continued Medical Work up  Expected Discharge Plan and Services   Discharge Planning Services: CM Consult   Living arrangements for the past 2 months: Single Family Home                                       Social Determinants of Health (SDOH) Interventions SDOH Screenings   Food Insecurity: No Food Insecurity (01/06/2023)  Housing: Low Risk  (01/06/2023)  Transportation Needs: No Transportation Needs (01/06/2023)  Utilities: Not At Risk (01/06/2023)  Tobacco Use: Medium Risk (01/07/2023)    Readmission Risk Interventions     No data to display

## 2023-01-07 NOTE — Consult Note (Signed)
Pharmacy Antibiotic Note  [name] is a 72 y.o. female admitted on 01/06/2023 with cellulitis of right great toe.  Pharmacy has been consulted for vancomycin dosing.  Vancomycin 1500 mg x 1 given 1/16 @ 2236  Plan: Start vancomycin 1250 mg IV every 24 hours Goal AUC 400-550 Estimated AUC 520.2, Cmin 12.6 Continue levofloxacin 750 mg IV every 24 hours Vancomycin levels at steady state or as clinically appropriate Follow renal function and cultures for adjustments  Height: 5\' 2"  (157.5 cm) Weight: 71.4 kg (157 lb 8 oz) IBW/kg (Calculated) : 50.1  Temp (24hrs), Avg:98.5 F (36.9 C), Min:97.6 F (36.4 C), Max:99.3 F (37.4 C)  Recent Labs  Lab 01/06/23 2309 01/07/23 0432  WBC 8.2 8.0  CREATININE 0.86 0.77    Estimated Creatinine Clearance: 59.7 mL/min (by C-G formula based on SCr of 0.77 mg/dL).    Allergies  Allergen Reactions   Oxycodone    Penicillins    Vicodin [Hydrocodone-Acetaminophen]     Antimicrobials this admission: 1/16 vancomycin >>  1/17 levofloxacin >>   Microbiology results: 1/16 BCx: ngtd 1/17 Wound Cx: pending  Thank you for allowing pharmacy to be a part of this patient's care.  Lorin Picket, PharmD 01/07/2023 7:11 AM

## 2023-01-07 NOTE — Progress Notes (Addendum)
Progress Note    [name]  FTD:[id number] DOB: 1951-07-18  DOA: 01/06/2023 PCP: Patient, No Pcp Per      Brief Narrative:    Medical records reviewed and are as summarized below:  [name] is a 72 y.o. female with past medical history significant for COPD, asthma, peripheral vascular disease, CAD, dyslipidemia, peripheral neuropathy, hypertension, history of low stage breast cancer s/p radiation therapy, lumpectomy and hormonal therapy, who presented to the hospital because of increasing swelling and redness of the right big toe.  She developed a wound on the right big toe about a week prior to admission.  It started as a blister that she popped. She went to see her podiatrist on 01/06/2023 and she was referred to the emergency department for further management.   She was admitted to the hospital for right foot and big toe cellulitis.    Assessment/Plan:   Principal Problem:   Cellulitis of great toe of right foot Active Problems:   PVD (peripheral vascular disease) (HCC)   Peripheral neuropathy    Body mass index is 28.81 kg/m.   Right foot, right big toe cellulitis: Continue empiric IV vancomycin and Levaquin.  Analgesics as needed.  Discontinue IV normal saline infusion.  MRI of the right foot did not show any evidence of osteomyelitis or abscess.  MRI findings were discussed with the patient.  Follow-up with podiatrist., Dr. Ardelle Anton.  Consulted Dr. Gilda Crease, vascular surgeon, per podiatrist recommendation. Patient was informed vancomycin and Levaquin doses are usually adjusted by pharmacist based on age and kidney function.  Right foot osteoarthritis, severe osteoarthritis of the talonavicular joint, moderate osteoarthritis of 4 TMT joint, mild osteoarthritis of calcaneocuboid joint.  Peripheral vascular disease: Arterial duplex showed mild range arterial occlusive disease on the right and into the moderate range arterial occlusive disease on the left.  Continue  aspirin.   Hypertension: Continue amlodipine.  Patient states she takes this at night and not during the day.  Other comorbidities include COPD, CAD, peripheral neuropathy, asthma, depression (no longer on Effexor)   Diet Order             Diet Heart Room service appropriate? Yes; Fluid consistency: Thin  Diet effective now                            Consultants: Podiatrist  Procedures: None    Medications:    [START ON 01/08/2023] amLODipine  10 mg Oral QHS   aspirin EC  81 mg Oral Daily   carvedilol  12.5 mg Oral BID WC   enoxaparin (LOVENOX) injection  40 mg Subcutaneous Q24H   melatonin  10 mg Oral QHS   multivitamin with minerals  1 tablet Oral Daily   Continuous Infusions:  levofloxacin (LEVAQUIN) IV 750 mg (01/07/23 0237)   vancomycin       Anti-infectives (From admission, onward)    Start     Dose/Rate Route Frequency Ordered Stop   01/07/23 2200  vancomycin (VANCOREADY) IVPB 1250 mg/250 mL        1,250 mg 166.7 mL/hr over 90 Minutes Intravenous Every 24 hours 01/07/23 0718     01/06/23 2330  levofloxacin (LEVAQUIN) IVPB 750 mg        750 mg 100 mL/hr over 90 Minutes Intravenous Every 24 hours 01/06/23 2235     01/06/23 2145  cefTRIAXone (ROCEPHIN) 1 g in sodium chloride 0.9 % 100 mL IVPB  Status:  Discontinued        1 g 200 mL/hr over 30 Minutes Intravenous Every 24 hours 01/06/23 2050 01/06/23 2235   01/06/23 2130  vancomycin (VANCOREADY) IVPB 1500 mg/300 mL        1,500 mg 150 mL/hr over 120 Minutes Intravenous  Once 01/06/23 2043 01/07/23 0036   01/06/23 2045  vancomycin (VANCOCIN) IVPB 1000 mg/200 mL premix  Status:  Discontinued        1,000 mg 200 mL/hr over 60 Minutes Intravenous  Once 01/06/23 1950 01/06/23 2043              Family Communication/Anticipated D/C date and plan/Code Status   DVT prophylaxis: enoxaparin (LOVENOX) injection 40 mg Start: 01/06/23 2045     Code Status: Full Code  Family Communication:  Daughter was on the phone at the beginning of this encounter Disposition Plan: Plan to discharge home when cleared by podiatrist   Status is: Inpatient Remains inpatient appropriate because: Cellulitis right foot on IV antibiotics       Subjective:   Interval events noted.  Patient was on the phone with her daughter when I walked in.  She was very upset that she had only received 1 dose of vancomycin and 1 dose of Levaquin since she came to the emergency department.  She said her daughter demanded that another dose of vancomycin be given as soon as possible.  I explained that vancomycin and Levaquin are dosed based on her creatinine clearance, and vancomycin and Levaquin have been ordered daily and not twice a day.  Patient was upset that nobody had come to see her this morning.  However, she had gone for ultrasound when I did my rounds in the morning.  She said that she was not "in a good mood".  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  She was using cuss words.  She also complained that the nursing staff was not paying attention to her.  She complains of headache.  No other complaints.  She said she has no pain in the foot because she has peripheral neuropathy.  Virgilio Belling, RN, was at the bedside.    Objective:    Vitals:   01/06/23 1902 01/06/23 2110 01/07/23 0020 01/07/23 0758  BP: 134/74  124/70 113/64  Pulse: 89  81 73  Resp: 16  18 16   Temp: 99.3 F (37.4 C)  97.6 F (36.4 C) 98.4 F (36.9 C)  TempSrc:    Oral  SpO2: 98%  98% 98%  Weight:  71.4 kg    Height:  5\' 2"  (1.575 m)     No data found.  No intake or output data in the 24 hours ending 01/07/23 1402 Filed Weights   01/06/23 2110  Weight: 71.4 kg    Exam:  GEN: NAD SKIN: Warm and dry EYES: No pallor or icterus ENT: MMM CV: RRR PULM: CTA B ABD: soft, ND, NT, +BS CNS: AAO x 3, non focal EXT: Dressing on the right foot.  Patient  said that dressing was just applied to the right foot by Dr. Earleen Newport, podiatrist, so she does not want dressing to be taken off        Data Reviewed:   I have personally reviewed following labs and imaging studies:  Labs: Labs show the following:   Basic Metabolic Panel: Recent Labs  Lab 01/06/23 2309 01/07/23 0432  NA 138  136  K 3.6 3.5  CL 105 106  CO2 24 22  GLUCOSE 129* 121*  BUN 19 16  CREATININE 0.86 0.77  CALCIUM 9.0 8.8*   GFR Estimated Creatinine Clearance: 59.7 mL/min (by C-G formula based on SCr of 0.77 mg/dL). Liver Function Tests: Recent Labs  Lab 01/06/23 2309  AST 19  ALT 17  ALKPHOS 89  BILITOT 0.7  PROT 7.5  ALBUMIN 3.7   No results for input(s): "LIPASE", "AMYLASE" in the last 168 hours. No results for input(s): "AMMONIA" in the last 168 hours. Coagulation profile Recent Labs  Lab 01/06/23 2309  INR 1.1    CBC: Recent Labs  Lab 01/06/23 2309 01/07/23 0432  WBC 8.2 8.0  NEUTROABS 4.8  --   HGB 13.9 12.9  HCT 42.5 39.0  MCV 85.3 84.8  PLT 289 283   Cardiac Enzymes: No results for input(s): "CKTOTAL", "CKMB", "CKMBINDEX", "TROPONINI" in the last 168 hours. BNP (last 3 results) No results for input(s): "PROBNP" in the last 8760 hours. CBG: No results for input(s): "GLUCAP" in the last 168 hours. D-Dimer: No results for input(s): "DDIMER" in the last 72 hours. Hgb A1c: No results for input(s): "HGBA1C" in the last 72 hours. Lipid Profile: No results for input(s): "CHOL", "HDL", "LDLCALC", "TRIG", "CHOLHDL", "LDLDIRECT" in the last 72 hours. Thyroid function studies: No results for input(s): "TSH", "T4TOTAL", "T3FREE", "THYROIDAB" in the last 72 hours.  Invalid input(s): "FREET3" Anemia work up: No results for input(s): "VITAMINB12", "FOLATE", "FERRITIN", "TIBC", "IRON", "RETICCTPCT" in the last 72 hours. Sepsis Labs: Recent Labs  Lab 01/06/23 2309 01/07/23 0432  WBC 8.2 8.0    Microbiology Recent Results (from the  past 240 hour(s))  Culture, blood (Routine X 2) w Reflex to ID Panel     Status: None (Preliminary result)   Collection Time: 01/06/23 11:09 PM   Specimen: BLOOD RIGHT ARM  Result Value Ref Range Status   Specimen Description BLOOD RIGHT ARM  Final   Special Requests   Final    BOTTLES DRAWN AEROBIC AND ANAEROBIC Blood Culture results may not be optimal due to an inadequate volume of blood received in culture bottles   Culture   Final    NO GROWTH < 12 HOURS Performed at [facility], [address], [place], [place] [address]    Report Status PENDING  Incomplete  Culture, blood (Routine X 2) w Reflex to ID Panel     Status: None (Preliminary result)   Collection Time: 01/06/23 11:09 PM   Specimen: BLOOD LEFT ARM  Result Value Ref Range Status   Specimen Description BLOOD LEFT ARM  Final   Special Requests   Final    BOTTLES DRAWN AEROBIC AND ANAEROBIC Blood Culture results may not be optimal due to an inadequate volume of blood received in culture bottles   Culture   Final    NO GROWTH < 12 HOURS Performed at [facility], [address], [place], [place] [address]    Report Status PENDING  Incomplete  Aerobic Culture w Gram Stain (superficial specimen)     Status: None (Preliminary result)   Collection Time: 01/07/23 12:54 AM   Specimen: Other Source  Result Value Ref Range Status   Specimen Description OTHER  Final   Special Requests NONE  Final   Gram Stain   Final    NO WBC SEEN NO ORGANISMS SEEN Performed at [facility], [address] [place], [place] [address]    Culture  PENDING  Incomplete   Report Status PENDING  Incomplete    Procedures and diagnostic studies:  US ARTERIAL ABI (SCREENING LOWER EXTREMITY)  Result Date: 01/07/2023 CLINICAL DATA:  72 year old female with peripheral arterial disease EXAM: NONINVASIVE PHYSIOLOGIC VASCULAR STUDY OF BILATERAL LOWER EXTREMITIES TECHNIQUE: Evaluation of both lower extremities was performed at  rest, including calculation of ankle-brachial indices, multiple segmental pressure evaluation, segmental Doppler and segmental pulse volume recording. COMPARISON:  None Available. FINDINGS: Right ABI:  0.8 Left ABI:  0.65 Right Lower Extremity: Segmental Doppler at the right ankle demonstrates monophasic dorsalis pedis waveform and multiphasic posterior tibial. Left Lower Extremity: Segmental Doppler at the left ankle demonstrates monophasic posterior tibial artery waveform and multiphasic dorsalis pedis IMPRESSION: Right: Resting ABI in the mild range arterial occlusive disease. Segmental exam at the right ankle demonstrates evidence of at least tibial arterial disease in the distribution of the anterior tibial artery. Left: Resting ABI in the moderate range arterial occlusive disease. Segmental exam at the left ankle demonstrates evidence of at least tibial arterial disease in the distribution of the posterior tibial artery Signed, Dulcy Fanny. Nadene Rubins, RPVI [facility] Electronically Signed   By: Corrie Mckusick D.O.   On: 01/07/2023 11:56   MR FOOT RIGHT WO CONTRAST  Result Date: 01/07/2023 CLINICAL DATA:  Soft tissue infection of the foot. EXAM: MRI OF THE RIGHT FOREFOOT WITHOUT CONTRAST TECHNIQUE: Multiplanar, multisequence MR imaging of the right forefoot was performed. No intravenous contrast was administered. COMPARISON:  None Available. FINDINGS: Bones/Joint/Cartilage No fracture or dislocation. Normal alignment. No joint effusion. Severe osteoarthritis of the talonavicular joint. Ankylosis of the second and third TMT joints. Moderate osteoarthritis of the fourth TMT joint. Mild osteoarthritis of the calcaneocuboid joint. Ligaments Collateral ligaments are intact.  Lisfranc ligament is intact. Muscles and Tendons Flexor, peroneal and extensor compartment tendons are intact. Generalized muscle atrophy. Soft tissue No fluid collection or  hematoma. No soft tissue mass. Soft tissue edema along the dorsal lateral aspect of the foot concerning for cellulitis. IMPRESSION: 1. Soft tissue edema along the dorsal lateral aspect of the foot concerning for cellulitis. No drainable fluid collection to suggest an abscess. 2. No osteomyelitis of the right forefoot. 3. Severe osteoarthritis of the talonavicular joint. 4. Moderate osteoarthritis of the fourth TMT joint. 5. Mild osteoarthritis of the calcaneocuboid joint. Electronically Signed   By: Kathreen Devoid M.D.   On: 01/07/2023 08:46               LOS: 1 day   [name]  [facility]   Pager on [email or web address]. If 7PM-7AM, please contact night-coverage at [email or web address]     01/07/2023, 2:02 PM

## 2023-01-07 NOTE — Plan of Care (Signed)

## 2023-01-07 NOTE — Consult Note (Signed)
Reason for Consult:Cellulitis, ulcer right foot Referring Physician: Dr. Jennye Boroughs, MD  [name] is an 72 y.o. female.  HPI: 72 y.o. female with medical history significant for asthma, COPD, PVD, coronary artery disease, dyslipidemia, peripheral neuropathy and hypertension, who presented to the emergency room with acute onset of right big toe swelling with erythema and ulceration extending to the foot.  She was seen in the office yesterday as a new patient and given the infection recommended to the ER probably denied this so therefore was able to directly admit her.   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   CAD (coronary artery disease)    COPD (chronic obstructive pulmonary disease) ([place])    Frequent UTI    Kidney stones     Past Surgical History:  Procedure Laterality Date   ANKLE FUSION Right 2017   fibular graft  Bilateral    bilater fibular missing and replaced in hips   HIP FRACTURE SURGERY Bilateral    MASTECTOMY, PARTIAL Left 2003   ROTATOR CUFF REPAIR Left    X 2   TONSILLECTOMY      Family History  Adopted: Yes    Social History:  reports that she has quit smoking. Her smoking use included cigarettes. She smoked an average of 1 pack per day. She has never used smokeless tobacco. She reports that she does not drink alcohol and does not use drugs.  Allergies:  Allergies  Allergen Reactions   Oxycodone    Penicillins    Vicodin [Hydrocodone-Acetaminophen]     Medications: I have reviewed the patient's current medications.  Results for orders placed or performed during the hospital encounter of 01/06/23 (from the past 48 hour(s))  CBC with Differential/Platelet     Status: None   Collection Time: 01/06/23 11:09 PM  Result Value Ref Range   WBC 8.2 4.0 - 10.5 K/uL   RBC 4.98 3.87 - 5.11 MIL/uL   Hemoglobin 13.9 12.0 - 15.0 g/dL   HCT 42.5 36.0 - 46.0 %   MCV 85.3 80.0 - 100.0 fL    MCH 27.9 26.0 - 34.0 pg   MCHC 32.7 30.0 - 36.0 g/dL   RDW 13.2 11.5 - 15.5 %   Platelets 289 150 - 400 K/uL   nRBC 0.0 0.0 - 0.2 %   Neutrophils Relative % 57 %   Neutro Abs 4.8 1.7 - 7.7 K/uL   Lymphocytes Relative 26 %   Lymphs Abs 2.1 0.7 - 4.0 K/uL   Monocytes Relative 12 %   Monocytes Absolute 1.0 0.1 - 1.0 K/uL   Eosinophils Relative 4 %   Eosinophils Absolute 0.3 0.0 - 0.5 K/uL   Basophils Relative 1 %   Basophils Absolute 0.1 0.0 - 0.1 K/uL   Immature Granulocytes 0 %   Abs Immature Granulocytes 0.02 0.00 - 0.07 K/uL    Comment: Performed at [facility], [address] [place], [place] [address]  Protime-INR     Status: None   Collection Time: 01/06/23 11:09 PM  Result Value Ref Range   Prothrombin Time 13.7 11.4 - 15.2 seconds   INR 1.1 0.8 - 1.2    Comment: (NOTE) INR goal varies based on device and disease states. Performed at [facility], [address] [place], [place] [address]   APTT     Status: None   Collection Time: 01/06/23 11:09 PM  Result Value  Ref Range   aPTT 29 24 - 36 seconds    Comment: Performed at [facility], [address] [place], [place] [address]  Comprehensive metabolic panel     Status: Abnormal   Collection Time: 01/06/23 11:09 PM  Result Value Ref Range   Sodium 138 135 - 145 mmol/L   Potassium 3.6 3.5 - 5.1 mmol/L   Chloride 105 98 - 111 mmol/L   CO2 24 22 - 32 mmol/L   Glucose, Bld 129 (H) 70 - 99 mg/dL    Comment: Glucose reference range applies only to samples taken after fasting for at least 8 hours.   BUN 19 8 - 23 mg/dL   Creatinine, Ser 5.17 0.44 - 1.00 mg/dL   Calcium 9.0 8.9 - 61.6 mg/dL   Total Protein 7.5 6.5 - 8.1 g/dL   Albumin 3.7 3.5 - 5.0 g/dL   AST 19 15 - 41 U/L   ALT 17 0 - 44 U/L   Alkaline Phosphatase 89 38 - 126 U/L   Total Bilirubin 0.7 0.3 - 1.2 mg/dL   GFR, Estimated >07 >37 mL/min    Comment: (NOTE) Calculated using the CKD-EPI Creatinine Equation (2021)    Anion  gap 9 5 - 15    Comment: Performed at [facility], [address] [place], [place] [address]  Culture, blood (Routine X 2) w Reflex to ID Panel     Status: None (Preliminary result)   Collection Time: 01/06/23 11:09 PM   Specimen: BLOOD RIGHT ARM  Result Value Ref Range   Specimen Description BLOOD RIGHT ARM    Special Requests      BOTTLES DRAWN AEROBIC AND ANAEROBIC Blood Culture results may not be optimal due to an inadequate volume of blood received in culture bottles   Culture      NO GROWTH < 12 HOURS Performed at [facility], [address], [place], [place] [address]    Report Status PENDING   Culture, blood (Routine X 2) w Reflex to ID Panel     Status: None (Preliminary result)   Collection Time: 01/06/23 11:09 PM   Specimen: BLOOD LEFT ARM  Result Value Ref Range   Specimen Description BLOOD LEFT ARM    Special Requests      BOTTLES DRAWN AEROBIC AND ANAEROBIC Blood Culture results may not be optimal due to an inadequate volume of blood received in culture bottles   Culture      NO GROWTH < 12 HOURS Performed at [facility], [address] [place], [place] [address]    Report Status PENDING   Aerobic Culture w Gram Stain (superficial specimen)     Status: None (Preliminary result)   Collection Time: 01/07/23 12:54 AM   Specimen: Other Source  Result Value Ref Range   Specimen Description OTHER    Special Requests NONE    Gram Stain      NO WBC SEEN NO ORGANISMS SEEN Performed at [facility], [address] [place], [place] [address]    Culture PENDING    Report Status PENDING   Basic metabolic panel     Status: Abnormal   Collection Time: 01/07/23  4:32 AM  Result Value Ref Range   Sodium 136 135 - 145 mmol/L   Potassium 3.5 3.5 - 5.1 mmol/L   Chloride 106 98 - 111 mmol/L   CO2 22 22 - 32 mmol/L   Glucose, Bld 121 (H) 70 - 99 mg/dL  Comment: Glucose reference range applies only to samples taken after fasting for at  least 8 hours.   BUN 16 8 - 23 mg/dL   Creatinine, Ser 3.01 0.44 - 1.00 mg/dL   Calcium 8.8 (L) 8.9 - 10.3 mg/dL   GFR, Estimated >60 >10 mL/min    Comment: (NOTE) Calculated using the CKD-EPI Creatinine Equation (2021)    Anion gap 8 5 - 15    Comment: Performed at [facility], [address] [place], [place] [address]  CBC     Status: None   Collection Time: 01/07/23  4:32 AM  Result Value Ref Range   WBC 8.0 4.0 - 10.5 K/uL   RBC 4.60 3.87 - 5.11 MIL/uL   Hemoglobin 12.9 12.0 - 15.0 g/dL   HCT 57.3 22.0 - 25.4 %   MCV 84.8 80.0 - 100.0 fL   MCH 28.0 26.0 - 34.0 pg   MCHC 33.1 30.0 - 36.0 g/dL   RDW 27.0 62.3 - 76.2 %   Platelets 283 150 - 400 K/uL   nRBC 0.0 0.0 - 0.2 %    Comment: Performed at [facility], [address] [place], [place] [address]    US ARTERIAL ABI (SCREENING LOWER EXTREMITY)  Result Date: 01/07/2023 CLINICAL DATA:  72 year old female with peripheral arterial disease EXAM: NONINVASIVE PHYSIOLOGIC VASCULAR STUDY OF BILATERAL LOWER EXTREMITIES TECHNIQUE: Evaluation of both lower extremities was performed at rest, including calculation of ankle-brachial indices, multiple segmental pressure evaluation, segmental Doppler and segmental pulse volume recording. COMPARISON:  None Available. FINDINGS: Right ABI:  0.8 Left ABI:  0.65 Right Lower Extremity: Segmental Doppler at the right ankle demonstrates monophasic dorsalis pedis waveform and multiphasic posterior tibial. Left Lower Extremity: Segmental Doppler at the left ankle demonstrates monophasic posterior tibial artery waveform and multiphasic dorsalis pedis IMPRESSION: Right: Resting ABI in the mild range arterial occlusive disease. Segmental exam at the right ankle demonstrates evidence of at least tibial arterial disease in the distribution of the anterior tibial artery. Left: Resting ABI in the moderate range arterial occlusive disease. Segmental exam at the left ankle demonstrates evidence  of at least tibial arterial disease in the distribution of the posterior tibial artery Signed, Yvone Neu. Miachel Roux, RPVI [facility] Radiology Electronically Signed   By: Gilmer Mor D.O.   On: 01/07/2023 11:56   MR FOOT RIGHT WO CONTRAST  Result Date: 01/07/2023 CLINICAL DATA:  Soft tissue infection of the foot. EXAM: MRI OF THE RIGHT FOREFOOT WITHOUT CONTRAST TECHNIQUE: Multiplanar, multisequence MR imaging of the right forefoot was performed. No intravenous contrast was administered. COMPARISON:  None Available. FINDINGS: Bones/Joint/Cartilage No fracture or dislocation. Normal alignment. No joint effusion. Severe osteoarthritis of the talonavicular joint. Ankylosis of the second and third TMT joints. Moderate osteoarthritis of the fourth TMT joint. Mild osteoarthritis of the calcaneocuboid joint. Ligaments Collateral ligaments are intact.  Lisfranc ligament is intact. Muscles and Tendons Flexor, peroneal and extensor compartment tendons are intact. Generalized muscle atrophy. Soft tissue No fluid collection or hematoma. No soft tissue mass. Soft tissue edema along the dorsal lateral aspect of the foot concerning for cellulitis. IMPRESSION: 1. Soft tissue edema along the dorsal lateral aspect of the foot concerning for cellulitis. No drainable fluid collection to suggest an abscess. 2. No osteomyelitis of the right forefoot. 3. Severe osteoarthritis of the talonavicular joint. 4. Moderate osteoarthritis of the fourth TMT joint. 5. Mild osteoarthritis of the calcaneocuboid joint. Electronically Signed   By: Alan Ripper  Patel M.D.   On: 01/07/2023 08:46    Review of Systems Blood pressure 113/64, pulse 73, temperature 98.4 F (36.9 C), temperature source Oral, resp. rate 16, height 5\' 2"  (1.575 m), weight 71.4 kg, SpO2 98 %. Physical Exam General: AAO x3, NAD  Dermatological: Necrotic wound present on the medial aspect of the right hallux and there is  still edema and erythema present to the toe but appears to be improved compared to yesterday.  Blisters present to the fourth and fifth toes.  There is no drainage or pus there is no fluctuance or crepitation but there is no malodor.      Vascular: Feet appear to be warm.  DP pulse decreased on right.  Neruologic: Sensation decreased  Musculoskeletal: No pain on exam.  Previous ankle joint arthrodesis.   Assessment/Plan: Cellulitis, ulceration right foot  Clinically appears to be improved compared to yesterday.  For now continue broad-spectrum antibiotics and local wound care.  Betadine was applied followed by dressing.  At this time we will continue with conservative management and no surgical plans at this time.  Recommend vascular consult.    Trula Slade 01/07/2023, 1:45 PM

## 2023-01-07 NOTE — Plan of Care (Signed)
  Problem: Safety: Goal: Ability to remain free from injury will improve Outcome: Progressing   Problem: Skin Integrity: Goal: Risk for impaired skin integrity will decrease Outcome: Progressing

## 2023-01-08 DIAGNOSIS — L03031 Cellulitis of right toe: Secondary | ICD-10-CM

## 2023-01-08 DIAGNOSIS — I739 Peripheral vascular disease, unspecified: Secondary | ICD-10-CM | POA: Diagnosis not present

## 2023-01-08 MED ORDER — LORATADINE 10 MG PO TABS
10.0000 mg | ORAL_TABLET | Freq: Every day | ORAL | Status: DC
  Administered 2023-01-08 – 2023-01-12 (×5): 10 mg via ORAL
  Filled 2023-01-08 (×5): qty 1

## 2023-01-08 MED ORDER — ORAL CARE MOUTH RINSE: 15.0000 mL | OROMUCOSAL | Status: DC | PRN

## 2023-01-08 NOTE — Progress Notes (Signed)
I spoke with the patient this morning. Her family has agreed with the patient to proceed with the procedure tomorrow. 01/09/23

## 2023-01-08 NOTE — TOC Progression Note (Signed)
Transition of Care [facility]) - Progression Note    Patient Details  Name: [name] MRN: [id number] Date of Birth: [birth year]  Transition of Care [facility]) CM/SW [place], RN Phone Number: 01/08/2023, 8:54 AM  Clinical Narrative:     on the vascular schedule for right lower extremity angiogram on Friday with Dr. Ella Jubilee.   TOC to continue to follow for needs  Expected Discharge Plan: Home/Self Care Barriers to Discharge: Continued Medical Work up  Expected Discharge Plan and Services   Discharge Planning Services: CM Consult   Living arrangements for the past 2 months: Single Family Home                                       Social Determinants of Health (SDOH) Interventions SDOH Screenings   Food Insecurity: No Food Insecurity (01/06/2023)  Housing: Low Risk  (01/06/2023)  Transportation Needs: No Transportation Needs (01/06/2023)  Utilities: Not At Risk (01/06/2023)  Tobacco Use: Medium Risk (01/07/2023)    Readmission Risk Interventions     No data to display

## 2023-01-08 NOTE — Plan of Care (Signed)

## 2023-01-08 NOTE — Progress Notes (Signed)
Progress Note    [name]  IWP:[id number] DOB: 1951-01-30  DOA: 01/06/2023 PCP: Patient, No Pcp Per      Brief Narrative:    Medical records reviewed and are as summarized below:  [name] is a 72 y.o. female with past medical history significant for COPD, asthma, peripheral vascular disease, CAD, dyslipidemia, peripheral neuropathy, hypertension, history of low stage breast cancer s/p radiation therapy, lumpectomy and hormonal therapy, who presented to the hospital because of increasing swelling and redness of the right big toe.  She developed a wound on the right big toe about a week prior to admission.  It started as a blister that she popped. She went to see her podiatrist on 01/06/2023 and she was referred to the emergency department for further management.   She was admitted to the hospital for right foot and big toe cellulitis.    Assessment/Plan:   Principal Problem:   Cellulitis of great toe of right foot Active Problems:   PVD (peripheral vascular disease) (HCC)   Peripheral neuropathy    Body mass index is 28.81 kg/m.   Right foot, right big toe cellulitis: Continue IV vancomycin.  Discontinue Levaquin.  Analgesics as needed for pain.  MRI of the right foot did not show any evidence of osteomyelitis or abscess.  Follow-up with podiatrist.   Right foot osteoarthritis, severe osteoarthritis of the talonavicular joint, moderate osteoarthritis of 4 TMT joint, mild osteoarthritis of calcaneocuboid joint.  Peripheral vascular disease: Arterial duplex showed mild range arterial occlusive disease on the right and into the moderate range arterial occlusive disease on the left.  Continue aspirin.  Plan for lower extremity angiogram tomorrow.   Hypertension: Continue amlodipine.   Other comorbidities include COPD, CAD, peripheral neuropathy, asthma, depression (no longer on Effexor)   Patient said she does not want Lovenox injections and she understands  that she is at increased risk for blood clot/DVT or PE.  Lovenox injection will be discontinued.  Patient is ambulatory.  Avoiding SCDs because of PVD.   Diet Order             Diet NPO time specified Except for: Sips with Meds  Diet effective midnight           Diet Heart Room service appropriate? Yes; Fluid consistency: Thin  Diet effective now                            Consultants: Podiatrist  Procedures: None    Medications:    amLODipine  10 mg Oral QHS   aspirin EC  81 mg Oral Daily   carvedilol  12.5 mg Oral BID WC   enoxaparin (LOVENOX) injection  40 mg Subcutaneous Q24H   loratadine  10 mg Oral Daily   melatonin  10 mg Oral QHS   multivitamin with minerals  1 tablet Oral Daily   Continuous Infusions:  vancomycin 1,250 mg (01/07/23 2259)     Anti-infectives (From admission, onward)    Start     Dose/Rate Route Frequency Ordered Stop   01/07/23 2200  vancomycin (VANCOREADY) IVPB 1250 mg/250 mL        1,250 mg 166.7 mL/hr over 90 Minutes Intravenous Every 24 hours 01/07/23 0718     01/06/23 2330  levofloxacin (LEVAQUIN) IVPB 750 mg  Status:  Discontinued        750 mg 100 mL/hr over 90 Minutes Intravenous Every 24 hours 01/06/23 2235 01/08/23 1600  01/06/23 2145  cefTRIAXone (ROCEPHIN) 1 g in sodium chloride 0.9 % 100 mL IVPB  Status:  Discontinued        1 g 200 mL/hr over 30 Minutes Intravenous Every 24 hours 01/06/23 2050 01/06/23 2235   01/06/23 2130  vancomycin (VANCOREADY) IVPB 1500 mg/300 mL        1,500 mg 150 mL/hr over 120 Minutes Intravenous  Once 01/06/23 2043 01/07/23 0036   01/06/23 2045  vancomycin (VANCOCIN) IVPB 1000 mg/200 mL premix  Status:  Discontinued        1,000 mg 200 mL/hr over 60 Minutes Intravenous  Once 01/06/23 1950 01/06/23 2043              Family Communication/Anticipated D/C date and plan/Code Status   DVT prophylaxis: enoxaparin (LOVENOX) injection 40 mg Start: 01/06/23 2045     Code Status:  Full Code  Family Communication: None Disposition Plan: Awaiting lower extremity angiogram   Status is: Inpatient Remains inpatient appropriate because: Cellulitis right foot on IV antibiotics       Subjective:   Interval events noted.  Patient said "I scared you yesterday".  She said she is in a better mood today.  No pain in the right foot or toes.  No headache today.  She said she slept well because she took trazodone last night.  She says she does not want any Lovenox injections.  She understands that she is at increased risk for blood clots.  Marchelle Folks, RN, was at the bedside.   Objective:    Vitals:   01/07/23 0020 01/07/23 0758 01/07/23 2329 01/08/23 0802  BP: 124/70 113/64 116/74 136/72  Pulse: 81 73 75 77  Resp: 18 16 17 16   Temp: 97.6 F (36.4 C) 98.4 F (36.9 C) 97.6 F (36.4 C) (!) 97.2 F (36.2 C)  TempSrc:  Oral    SpO2: 98% 98% 96% 95%  Weight:      Height:       No data found.   Intake/Output Summary (Last 24 hours) at 01/08/2023 1608 Last data filed at 01/08/2023 0852 Gross per 24 hour  Intake 520 ml  Output 1 ml  Net 519 ml   Filed Weights   01/06/23 2110  Weight: 71.4 kg    Exam:   GEN: NAD SKIN: Warm and dry EYES: No pallor or icterus ENT: MMM CV: RRR PULM: CTA B ABD: soft, ND, NT, +BS CNS: AAO x 3, non focal EXT: Redness and hyperpigmented area noted on the right big toe.  No drainage noted.  Wound looks dry.       Data Reviewed:   I have personally reviewed following labs and imaging studies:  Labs: Labs show the following:   Basic Metabolic Panel: Recent Labs  Lab 01/06/23 2309 01/07/23 0432  NA 138 136  K 3.6 3.5  CL 105 106  CO2 24 22  GLUCOSE 129* 121*  BUN 19 16  CREATININE 0.86 0.77  CALCIUM 9.0 8.8*   GFR Estimated Creatinine Clearance: 59.7 mL/min (by C-G formula based on SCr of 0.77 mg/dL). Liver Function Tests: Recent Labs  Lab 01/06/23 2309  AST 19  ALT 17  ALKPHOS 89  BILITOT 0.7  PROT 7.5   ALBUMIN 3.7   No results for input(s): "LIPASE", "AMYLASE" in the last 168 hours. No results for input(s): "AMMONIA" in the last 168 hours. Coagulation profile Recent Labs  Lab 01/06/23 2309  INR 1.1    CBC: Recent Labs  Lab 01/06/23 2309 01/07/23  0432  WBC 8.2 8.0  NEUTROABS 4.8  --   HGB 13.9 12.9  HCT 42.5 39.0  MCV 85.3 84.8  PLT 289 283   Cardiac Enzymes: No results for input(s): "CKTOTAL", "CKMB", "CKMBINDEX", "TROPONINI" in the last 168 hours. BNP (last 3 results) No results for input(s): "PROBNP" in the last 8760 hours. CBG: No results for input(s): "GLUCAP" in the last 168 hours. D-Dimer: No results for input(s): "DDIMER" in the last 72 hours. Hgb A1c: No results for input(s): "HGBA1C" in the last 72 hours. Lipid Profile: No results for input(s): "CHOL", "HDL", "LDLCALC", "TRIG", "CHOLHDL", "LDLDIRECT" in the last 72 hours. Thyroid function studies: No results for input(s): "TSH", "T4TOTAL", "T3FREE", "THYROIDAB" in the last 72 hours.  Invalid input(s): "FREET3" Anemia work up: No results for input(s): "VITAMINB12", "FOLATE", "FERRITIN", "TIBC", "IRON", "RETICCTPCT" in the last 72 hours. Sepsis Labs: Recent Labs  Lab 01/06/23 2309 01/07/23 0432  WBC 8.2 8.0    Microbiology Recent Results (from the past 240 hour(s))  Culture, blood (Routine X 2) w Reflex to ID Panel     Status: None (Preliminary result)   Collection Time: 01/06/23 11:09 PM   Specimen: BLOOD RIGHT ARM  Result Value Ref Range Status   Specimen Description BLOOD RIGHT ARM  Final   Special Requests   Final    BOTTLES DRAWN AEROBIC AND ANAEROBIC Blood Culture results may not be optimal due to an inadequate volume of blood received in culture bottles   Culture   Final    NO GROWTH 2 DAYS Performed at [facility], [address] [place], [place] [address]    Report Status PENDING  Incomplete  Culture, blood (Routine X 2) w Reflex to ID Panel     Status: None (Preliminary  result)   Collection Time: 01/06/23 11:09 PM   Specimen: BLOOD LEFT ARM  Result Value Ref Range Status   Specimen Description BLOOD LEFT ARM  Final   Special Requests   Final    BOTTLES DRAWN AEROBIC AND ANAEROBIC Blood Culture results may not be optimal due to an inadequate volume of blood received in culture bottles   Culture   Final    NO GROWTH 2 DAYS Performed at [facility], [address] [place], [place] [address]    Report Status PENDING  Incomplete  Aerobic Culture w Gram Stain (superficial specimen)     Status: None (Preliminary result)   Collection Time: 01/07/23 12:54 AM   Specimen: Other Source  Result Value Ref Range Status   Specimen Description OTHER  Final   Special Requests NONE  Final   Gram Stain NO WBC SEEN NO ORGANISMS SEEN   Final   Culture   Final    CULTURE REINCUBATED FOR BETTER GROWTH Performed at [facility], [place] [address] [place], [place] [address]    Report Status PENDING  Incomplete    Procedures and diagnostic studies:  US ARTERIAL ABI (SCREENING LOWER EXTREMITY)  Result Date: 01/07/2023 CLINICAL DATA:  72 year old female with peripheral arterial disease EXAM: NONINVASIVE PHYSIOLOGIC VASCULAR STUDY OF BILATERAL LOWER EXTREMITIES TECHNIQUE: Evaluation of both lower extremities was performed at rest, including calculation of ankle-brachial indices, multiple segmental pressure evaluation, segmental Doppler and segmental pulse volume recording. COMPARISON:  None Available. FINDINGS: Right ABI:  0.8 Left ABI:  0.65 Right Lower Extremity: Segmental Doppler at the right ankle demonstrates monophasic dorsalis pedis waveform and multiphasic posterior tibial. Left Lower Extremity: Segmental Doppler at the left ankle demonstrates monophasic posterior  tibial artery waveform and multiphasic dorsalis pedis IMPRESSION: Right: Resting ABI in the mild range arterial occlusive disease. Segmental exam at the right ankle demonstrates evidence of at  least tibial arterial disease in the distribution of the anterior tibial artery. Left: Resting ABI in the moderate range arterial occlusive disease. Segmental exam at the left ankle demonstrates evidence of at least tibial arterial disease in the distribution of the posterior tibial artery Signed, Dulcy Fanny. Nadene Rubins, RPVI [facility] Radiology Electronically Signed   By: Corrie Mckusick D.O.   On: 01/07/2023 11:56   MR FOOT RIGHT WO CONTRAST  Result Date: 01/07/2023 CLINICAL DATA:  Soft tissue infection of the foot. EXAM: MRI OF THE RIGHT FOREFOOT WITHOUT CONTRAST TECHNIQUE: Multiplanar, multisequence MR imaging of the right forefoot was performed. No intravenous contrast was administered. COMPARISON:  None Available. FINDINGS: Bones/Joint/Cartilage No fracture or dislocation. Normal alignment. No joint effusion. Severe osteoarthritis of the talonavicular joint. Ankylosis of the second and third TMT joints. Moderate osteoarthritis of the fourth TMT joint. Mild osteoarthritis of the calcaneocuboid joint. Ligaments Collateral ligaments are intact.  Lisfranc ligament is intact. Muscles and Tendons Flexor, peroneal and extensor compartment tendons are intact. Generalized muscle atrophy. Soft tissue No fluid collection or hematoma. No soft tissue mass. Soft tissue edema along the dorsal lateral aspect of the foot concerning for cellulitis. IMPRESSION: 1. Soft tissue edema along the dorsal lateral aspect of the foot concerning for cellulitis. No drainable fluid collection to suggest an abscess. 2. No osteomyelitis of the right forefoot. 3. Severe osteoarthritis of the talonavicular joint. 4. Moderate osteoarthritis of the fourth TMT joint. 5. Mild osteoarthritis of the calcaneocuboid joint. Electronically Signed   By: Kathreen Devoid M.D.   On: 01/07/2023 08:46               LOS: 2 days   Nobuo Nunziata  [facility]   Pager on [email or web address]  7PM-7AM, please contact night-coverage at [email or web address]     01/08/2023, 4:08 PM

## 2023-01-08 NOTE — H&P (View-Only) (Signed)
I spoke with the patient this morning. Her family has agreed with the patient to proceed with the procedure tomorrow. 01/09/23   

## 2023-01-09 ENCOUNTER — Encounter
Admission: AD | Disposition: A | Discharge status: Home / Self Care | Payer: Self-pay | Source: Ambulatory Visit | Attending: Internal Medicine

## 2023-01-09 DIAGNOSIS — I739 Peripheral vascular disease, unspecified: Secondary | ICD-10-CM | POA: Diagnosis not present

## 2023-01-09 DIAGNOSIS — I70235 Atherosclerosis of native arteries of right leg with ulceration of other part of foot: Principal | ICD-10-CM

## 2023-01-09 DIAGNOSIS — L03031 Cellulitis of right toe: Secondary | ICD-10-CM | POA: Diagnosis not present

## 2023-01-09 DIAGNOSIS — L97518 Non-pressure chronic ulcer of other part of right foot with other specified severity: Secondary | ICD-10-CM

## 2023-01-09 HISTORY — PX: LOWER EXTREMITY ANGIOGRAPHY: CATH118251

## 2023-01-09 SURGERY — LOWER EXTREMITY ANGIOGRAPHY
Anesthesia: Moderate Sedation | Laterality: Right

## 2023-01-09 MED ORDER — METHYLPREDNISOLONE SODIUM SUCC 125 MG IJ SOLR: 125.0000 mg | Freq: Once | INTRAMUSCULAR | Status: DC | PRN

## 2023-01-09 MED ORDER — MIDAZOLAM HCL 2 MG/2ML IJ SOLN
INTRAMUSCULAR | Status: DC | PRN
  Administered 2023-01-09 (×4): 1 mg via INTRAVENOUS

## 2023-01-09 MED ORDER — ONDANSETRON HCL 4 MG/2ML IJ SOLN: 4.0000 mg | Freq: Four times a day (QID) | INTRAMUSCULAR | Status: DC | PRN

## 2023-01-09 MED ORDER — FAMOTIDINE 20 MG PO TABS: 40.0000 mg | ORAL_TABLET | Freq: Once | ORAL | Status: DC | PRN

## 2023-01-09 MED ORDER — HYDROMORPHONE HCL 1 MG/ML IJ SOLN: 1.0000 mg | Freq: Once | INTRAMUSCULAR | Status: DC | PRN

## 2023-01-09 MED ORDER — IODIXANOL 320 MG/ML IV SOLN
INTRAVENOUS | Status: DC | PRN
  Administered 2023-01-09: 75 mL

## 2023-01-09 MED ORDER — MIDAZOLAM HCL 2 MG/2ML IJ SOLN
INTRAMUSCULAR | Status: AC
  Filled 2023-01-09: qty 2

## 2023-01-09 MED ORDER — CHLORHEXIDINE GLUCONATE 4 % EX LIQD: 60.0000 mL | Freq: Once | CUTANEOUS | Status: DC

## 2023-01-09 MED ORDER — SODIUM CHLORIDE 0.9 % IV SOLN: INTRAVENOUS | Status: DC

## 2023-01-09 MED ORDER — FENTANYL CITRATE PF 50 MCG/ML IJ SOSY: 12.5000 ug | PREFILLED_SYRINGE | Freq: Once | INTRAMUSCULAR | Status: DC | PRN

## 2023-01-09 MED ORDER — CLOPIDOGREL BISULFATE 75 MG PO TABS
300.0000 mg | ORAL_TABLET | ORAL | Status: AC
  Administered 2023-01-09: 300 mg via ORAL
  Filled 2023-01-09: qty 4

## 2023-01-09 MED ORDER — CHLORHEXIDINE GLUCONATE 4 % EX LIQD
60.0000 mL | Freq: Once | CUTANEOUS | Status: AC
  Administered 2023-01-09: 4 via TOPICAL

## 2023-01-09 MED ORDER — CLOPIDOGREL BISULFATE 75 MG PO TABS
75.0000 mg | ORAL_TABLET | Freq: Every day | ORAL | Status: DC
  Administered 2023-01-10 – 2023-01-11 (×2): 75 mg via ORAL
  Filled 2023-01-09 (×3): qty 1

## 2023-01-09 MED ORDER — SODIUM CHLORIDE 0.9% FLUSH: 3.0000 mL | INTRAVENOUS | Status: DC | PRN

## 2023-01-09 MED ORDER — FENTANYL CITRATE PF 50 MCG/ML IJ SOSY
PREFILLED_SYRINGE | INTRAMUSCULAR | Status: AC
  Filled 2023-01-09: qty 1

## 2023-01-09 MED ORDER — CEFAZOLIN SODIUM-DEXTROSE 2-4 GM/100ML-% IV SOLN: 2.0000 g | INTRAVENOUS | Status: DC

## 2023-01-09 MED ORDER — MIDAZOLAM HCL 2 MG/ML PO SYRP: 8.0000 mg | ORAL_SOLUTION | Freq: Once | ORAL | Status: DC | PRN

## 2023-01-09 MED ORDER — SODIUM CHLORIDE 0.9 % IV SOLN: 250.0000 mL | INTRAVENOUS | Status: DC | PRN

## 2023-01-09 MED ORDER — FENTANYL CITRATE (PF) 100 MCG/2ML IJ SOLN
INTRAMUSCULAR | Status: AC
  Filled 2023-01-09: qty 2

## 2023-01-09 MED ORDER — FENTANYL CITRATE (PF) 100 MCG/2ML IJ SOLN
INTRAMUSCULAR | Status: DC | PRN
  Administered 2023-01-09 (×4): 25 ug via INTRAVENOUS

## 2023-01-09 MED ORDER — DIPHENHYDRAMINE HCL 50 MG/ML IJ SOLN: 50.0000 mg | Freq: Once | INTRAMUSCULAR | Status: DC | PRN

## 2023-01-09 MED ORDER — SODIUM CHLORIDE 0.9 % IV SOLN: INTRAVENOUS | Status: AC

## 2023-01-09 MED ORDER — HEPARIN SODIUM (PORCINE) 1000 UNIT/ML IJ SOLN
INTRAMUSCULAR | Status: AC
  Filled 2023-01-09: qty 10

## 2023-01-09 MED ORDER — HEPARIN SODIUM (PORCINE) 1000 UNIT/ML IJ SOLN
INTRAMUSCULAR | Status: DC | PRN
  Administered 2023-01-09: 6000 [IU] via INTRAVENOUS

## 2023-01-09 MED ORDER — SODIUM CHLORIDE 0.9% FLUSH
3.0000 mL | Freq: Two times a day (BID) | INTRAVENOUS | Status: DC
  Administered 2023-01-10 – 2023-01-12 (×5): 3 mL via INTRAVENOUS

## 2023-01-09 SURGICAL SUPPLY — 28 items
BALLN LUTONIX 4X220X130 (BALLOONS) ×2
BALLN LUTONIX DCB 4X60X130 (BALLOONS) ×1
BALLOON LUTONIX 4X220X130 (BALLOONS) IMPLANT
BALLOON LUTONIX DCB 4X60X130 (BALLOONS) IMPLANT
CATH ANGIO 5F PIGTAIL 65CM (CATHETERS) IMPLANT
CATH BEACON 5 .038 100 VERT TP (CATHETERS) IMPLANT
CATH SEEKER .018X150 (CATHETERS) IMPLANT
COVER DRAPE FLUORO 36X44 (DRAPES) IMPLANT
COVER PROBE ULTRASOUND 5X96 (MISCELLANEOUS) IMPLANT
DEVICE STARCLOSE SE CLOSURE (Vascular Products) IMPLANT
GLIDEWIRE ADV .035X260CM (WIRE) IMPLANT
GOWN STRL REUS W/ TWL LRG LVL3 (GOWN DISPOSABLE) ×1 IMPLANT
GOWN STRL REUS W/TWL LRG LVL3 (GOWN DISPOSABLE) ×1
GUIDEWIRE PFTE-COATED .018X300 (WIRE) IMPLANT
KIT ENCORE 26 ADVANTAGE (KITS) IMPLANT
NDL ENTRY 21GA 7CM ECHOTIP (NEEDLE) IMPLANT
NEEDLE ENTRY 21GA 7CM ECHOTIP (NEEDLE) ×1 IMPLANT
PACK ANGIOGRAPHY (CUSTOM PROCEDURE TRAY) ×1 IMPLANT
SET INTRO CAPELLA COAXIAL (SET/KITS/TRAYS/PACK) IMPLANT
SHEATH ANL2 6FRX45 HC (SHEATH) IMPLANT
SHEATH BRITE TIP 5FRX11 (SHEATH) IMPLANT
STENT LIFESTENT 5F 5X120X135 (Permanent Stent) IMPLANT
STENT LIFESTENT 5F 5X150X135 (Permanent Stent) IMPLANT
STENT LIFESTENT 5F 5X60X135 (Permanent Stent) IMPLANT
SYR MEDRAD MARK 7 150ML (SYRINGE) IMPLANT
TUBING CONTRAST HIGH PRESS 72 (TUBING) IMPLANT
WIRE G V18X300CM (WIRE) IMPLANT
WIRE GUIDERIGHT .035X150 (WIRE) IMPLANT

## 2023-01-09 NOTE — Progress Notes (Signed)
Progress Note    [name]  NGE:[id number] DOB: August 14, 1951  DOA: 01/06/2023 PCP: Patient, No Pcp Per      Brief Narrative:    Medical records reviewed and are as summarized below:  [name] is a 72 y.o. female with past medical history significant for COPD, asthma, peripheral vascular disease, CAD, dyslipidemia, peripheral neuropathy, hypertension, history of low stage breast cancer s/p radiation therapy, lumpectomy and hormonal therapy, who presented to the hospital because of increasing swelling and redness of the right big toe.  She developed a wound on the right big toe about a week prior to admission.  It started as a blister that she popped. She went to see her podiatrist on 01/06/2023 and she was referred to the emergency department for further management.   She was admitted to the hospital for right foot and big toe cellulitis.    Assessment/Plan:   Principal Problem:   Cellulitis of great toe of right foot Active Problems:   PVD (peripheral vascular disease) (HCC)   Peripheral neuropathy    Body mass index is 28.81 kg/m.   Right foot, right big toe cellulitis: Discontinue IV vancomycin.  Case discussed with Dr. Nicholes Rough, podiatrist, via secure chat on 01/09/2023.  He said there is no need for additional antibiotics.  MRI of the right foot did not show any evidence of osteomyelitis or abscess.  Follow-up with podiatrist.   Right foot osteoarthritis, severe osteoarthritis of the talonavicular joint, moderate osteoarthritis of 4 TMT joint, mild osteoarthritis of calcaneocuboid joint.  Peripheral vascular disease: Arterial duplex showed mild range arterial occlusive disease on the right and into the moderate range arterial occlusive disease on the left.  Continue aspirin.  Plan for lower extremity angiogram today.  Follow-up with vascular surgeon.  Hypertension: Continue amlodipine.   Other comorbidities include COPD, CAD, peripheral neuropathy, asthma,  depression (no longer on Effexor)    Diet Order             Diet NPO time specified  Diet effective midnight           Diet NPO time specified Except for: Sips with Meds  Diet effective midnight                            Consultants: Podiatrist Vascular surgeon  Procedures: None    Medications:    amLODipine  10 mg Oral QHS   aspirin EC  81 mg Oral Daily   carvedilol  12.5 mg Oral BID WC   [START ON 01/10/2023] chlorhexidine  60 mL Topical Once   loratadine  10 mg Oral Daily   melatonin  10 mg Oral QHS   multivitamin with minerals  1 tablet Oral Daily   Continuous Infusions:  sodium chloride      ceFAZolin (ANCEF) IV     vancomycin 1,250 mg (01/08/23 2212)     Anti-infectives (From admission, onward)    Start     Dose/Rate Route Frequency Ordered Stop   01/09/23 1009  ceFAZolin (ANCEF) IVPB 2g/100 mL premix        2 g 200 mL/hr over 30 Minutes Intravenous 30 min pre-op 01/09/23 1009     01/07/23 2200  vancomycin (VANCOREADY) IVPB 1250 mg/250 mL        1,250 mg 166.7 mL/hr over 90 Minutes Intravenous Every 24 hours 01/07/23 0718     01/06/23 2330  levofloxacin (LEVAQUIN) IVPB 750 mg  Status:  Discontinued        750 mg 100 mL/hr over 90 Minutes Intravenous Every 24 hours 01/06/23 2235 01/08/23 1600   01/06/23 2145  cefTRIAXone (ROCEPHIN) 1 g in sodium chloride 0.9 % 100 mL IVPB  Status:  Discontinued        1 g 200 mL/hr over 30 Minutes Intravenous Every 24 hours 01/06/23 2050 01/06/23 2235   01/06/23 2130  vancomycin (VANCOREADY) IVPB 1500 mg/300 mL        1,500 mg 150 mL/hr over 120 Minutes Intravenous  Once 01/06/23 2043 01/07/23 0036   01/06/23 2045  vancomycin (VANCOCIN) IVPB 1000 mg/200 mL premix  Status:  Discontinued        1,000 mg 200 mL/hr over 60 Minutes Intravenous  Once 01/06/23 1950 01/06/23 2043              Family Communication/Anticipated D/C date and plan/Code Status   DVT prophylaxis:      Code Status: Full  Code  Family Communication: None Disposition Plan: Awaiting lower extremity angiogram   Status is: Inpatient Remains inpatient appropriate because: Cellulitis right foot on IV antibiotics       Subjective:   Interval events noted.  She feels well overall has no complaints.  No pain in the foot.  Landry Mellow, RN, was at the bedside.  Transport was at the bedside to take her down for angiogram.   Objective:    Vitals:   01/08/23 1722 01/08/23 2159 01/09/23 0149 01/09/23 0752  BP: (!) 140/90 (!) 144/65 133/72 122/76  Pulse: 82 69 76 65  Resp: 16  18 16   Temp: (!) 97.5 F (36.4 C)  98.5 F (36.9 C) 98.2 F (36.8 C)  TempSrc:      SpO2: 96%  93% 95%  Weight:      Height:       No data found.   Intake/Output Summary (Last 24 hours) at 01/09/2023 1148 Last data filed at 01/09/2023 1034 Gross per 24 hour  Intake 10 ml  Output --  Net 10 ml   Filed Weights   01/06/23 2110  Weight: 71.4 kg    Exam:  GEN: NAD SKIN: Warm and dry EYES: EOMI ENT: MMM CV: RRR PULM: CTA B ABD: soft, ND, NT, +BS CNS: AAO x 3, non focal EXT: Redness and hyperpigmented area on the right big toe       Data Reviewed:   I have personally reviewed following labs and imaging studies:  Labs: Labs show the following:   Basic Metabolic Panel: Recent Labs  Lab 01/06/23 2309 01/07/23 0432  NA 138 136  K 3.6 3.5  CL 105 106  CO2 24 22  GLUCOSE 129* 121*  BUN 19 16  CREATININE 0.86 0.77  CALCIUM 9.0 8.8*   GFR Estimated Creatinine Clearance: 59.7 mL/min (by C-G formula based on SCr of 0.77 mg/dL). Liver Function Tests: Recent Labs  Lab 01/06/23 2309  AST 19  ALT 17  ALKPHOS 89  BILITOT 0.7  PROT 7.5  ALBUMIN 3.7   No results for input(s): "LIPASE", "AMYLASE" in the last 168 hours. No results for input(s): "AMMONIA" in the last 168 hours. Coagulation profile Recent Labs  Lab 01/06/23 2309  INR 1.1    CBC: Recent Labs  Lab 01/06/23 2309 01/07/23 0432  WBC  8.2 8.0  NEUTROABS 4.8  --   HGB 13.9 12.9  HCT 42.5 39.0  MCV 85.3 84.8  PLT 289 283   Cardiac Enzymes: No results for input(s): "  CKTOTAL", "CKMB", "CKMBINDEX", "TROPONINI" in the last 168 hours. BNP (last 3 results) No results for input(s): "PROBNP" in the last 8760 hours. CBG: No results for input(s): "GLUCAP" in the last 168 hours. D-Dimer: No results for input(s): "DDIMER" in the last 72 hours. Hgb A1c: No results for input(s): "HGBA1C" in the last 72 hours. Lipid Profile: No results for input(s): "CHOL", "HDL", "LDLCALC", "TRIG", "CHOLHDL", "LDLDIRECT" in the last 72 hours. Thyroid function studies: No results for input(s): "TSH", "T4TOTAL", "T3FREE", "THYROIDAB" in the last 72 hours.  Invalid input(s): "FREET3" Anemia work up: No results for input(s): "VITAMINB12", "FOLATE", "FERRITIN", "TIBC", "IRON", "RETICCTPCT" in the last 72 hours. Sepsis Labs: Recent Labs  Lab 01/06/23 2309 01/07/23 0432  WBC 8.2 8.0    Microbiology Recent Results (from the past 240 hour(s))  Culture, blood (Routine X 2) w Reflex to ID Panel     Status: None (Preliminary result)   Collection Time: 01/06/23 11:09 PM   Specimen: BLOOD RIGHT ARM  Result Value Ref Range Status   Specimen Description BLOOD RIGHT ARM  Final   Special Requests   Final    BOTTLES DRAWN AEROBIC AND ANAEROBIC Blood Culture results may not be optimal due to an inadequate volume of blood received in culture bottles   Culture   Final    NO GROWTH 3 DAYS Performed at [facility], [address], [place], [place] [address]    Report Status PENDING  Incomplete  Culture, blood (Routine X 2) w Reflex to ID Panel     Status: None (Preliminary result)   Collection Time: 01/06/23 11:09 PM   Specimen: BLOOD LEFT ARM  Result Value Ref Range Status   Specimen Description BLOOD LEFT ARM  Final   Special Requests   Final    BOTTLES DRAWN AEROBIC AND ANAEROBIC Blood Culture results may not be optimal due to an  inadequate volume of blood received in culture bottles   Culture   Final    NO GROWTH 3 DAYS Performed at [facility], [address] [place], [address] [address]    Report Status PENDING  Incomplete  Aerobic Culture w Gram Stain (superficial specimen)     Status: None (Preliminary result)   Collection Time: 01/07/23 12:54 AM   Specimen: Other Source  Result Value Ref Range Status   Specimen Description OTHER  Final   Special Requests NONE  Final   Gram Stain NO WBC SEEN NO ORGANISMS SEEN   Final   Culture   Final    RARE GRAM NEGATIVE RODS IDENTIFICATION AND SUSCEPTIBILITIES TO FOLLOW CULTURE REINCUBATED FOR BETTER GROWTH Performed at [facility], [place] [address] [place], [place] [address]    Report Status PENDING  Incomplete    Procedures and diagnostic studies:  No results found.             LOS: 3 days   Niclas Markell  [facility]   Pager on [email or web address]. If 7PM-7AM, please contact night-coverage at [email or web address]     01/09/2023, 11:48 AM

## 2023-01-09 NOTE — Plan of Care (Signed)

## 2023-01-09 NOTE — Op Note (Signed)
[facility]  Percutaneous Study/Intervention Procedural Note   Date of Surgery: 01/09/2023  Surgeon:  Katha Cabal, MD.  Pre-operative Diagnosis: Atherosclerotic occlusive disease bilateral lower extremities with ulceration of the right foot  Post-operative diagnosis:  Same  Procedure(s) Performed:             1.  Introduction catheter into right lower extremity 3rd order catheter placement              2.    Contrast injection right lower extremity for distal runoff             3.  Percutaneous transluminal angioplasty and stent placement right superficial femoral artery and above-knee popliteal             4.  Star close closure left common femoral arteriotomy  Anesthesia: Conscious sedation was administered under my direct supervision by the interventional radiology RN. IV Versed plus fentanyl were utilized. Continuous ECG, pulse oximetry and blood pressure was monitored throughout the entire procedure.  Conscious sedation was for a total of 1 hour 9 minutes 22 seconds.  Sheath: 6 [place] Ansell left common femoral retrograde  Contrast: 75 cc  Fluoroscopy Time: 14.4 minutes  Indications:  Mikell Barella presents to [facility] with ulceration of the right foot associated with cellulitis.  She has been admitted for further treatment.  Pedal pulses are nonpalpable.  This places her at risk for limb loss.  Angiography with the hope for intervention is recommended.  The risks and benefits are reviewed all questions answered patient agrees to proceed.  Procedure:  Alec Mcphee is a 72 y.o. y.o. female who was identified and appropriate procedural time out was performed.  The patient was then placed supine on the table and prepped and draped in the usual sterile fashion.    Ultrasound was placed in the sterile sleeve and the left groin was evaluated the left common femoral artery was echolucent and pulsatile indicating patency.  Image was  recorded for the permanent record and under real-time visualization a microneedle was inserted into the common femoral artery microwire followed by a micro-sheath.  A J-wire was then advanced through the micro-sheath and a  5 [place] sheath was then inserted over a J-wire. J-wire was then advanced and a 5 French pigtail catheter was positioned at the level of T12. AP projection of the aorta was then obtained. Pigtail catheter was repositioned to above the bifurcation and a LAO view of the pelvis was obtained.  Subsequently a pigtail catheter with the stiff angle Glidewire was used to cross the aortic bifurcation the catheter wire were advanced down into the right distal external iliac artery. Oblique view of the femoral bifurcation was then obtained and subsequently the wire was reintroduced and the pigtail catheter negotiated into the SFA representing third order catheter placement. Distal runoff was then performed.  6000 units of heparin was then given and allowed to circulate and a Ansell sheath was advanced up and over the bifurcation and positioned in the mid common femoral artery  KMP  catheter and advantage Glidewire were then negotiated down into the distal popliteal.  Distal runoff was then completed by hand injection through the catheter. The wire was then reintroduced a 5 mm x 150 mm life stent was then deployed with its distal end in the above-knee popliteal proximal to the level of the femoral condyles extending proximally.  A 5 mm x 120 mm stent was deployed and subsequently postdilated with  two Lutonix drug-eluting balloons a 4 mm x 220 mm and a 4 mm x 120 mm balloon.  Follow-up imaging demonstrated in the proximal segment of the SFA greater than 70% residual stenosis with delayed flow of contrast.  This was not appreciated in the original oblique view but is clearly noted in the AP and therefore an additional 5 mm x 60 mm life stent was deployed extending more proximally almost to the origin and  then postdilated with a 5 mm x 60 mm. Distal runoff was then reassessed.  And to the SFA and popliteal now had less than 15% residual stenosis throughout their entire course.  The Kumpe catheter was then reintroduced and magnified imaging of the trifurcation was then created.  I negotiated the Kumpe catheter and wire into the anterior tibial however toward the ankle I was unable to advance the catheter wire combination any further.  The portions that I had crossed appeared atraumatic.  Peroneal is completely nonvisualized throughout its entire course.  There is an aberrant origin for the posterior tibial just above the level of the tibial plateau in the mid popliteal.  This extends all the way down to the ankle and although it appears to be only 2 mm or so in diameter it is free of hemodynamically significant lesions and demonstrates inline flow to the ankle.  After review of these images the sheath is pulled into the left external iliac oblique of the common femoral is obtained and a Star close device deployed. There no immediate complications.   Findings:  The abdominal aorta is opacified with a bolus injection contrast. Renal arteries are single and the left renal artery demonstrates greater than 70% stenosis at its origin. The aorta itself has diffuse disease but no hemodynamically significant lesions. The common and external iliac arteries are widely patent bilaterally.  The right common femoral is widely patent as is the profunda femoris.  The SFA does indeed have a significant stenosis with numerous lesions of greater than 80 and 90% extending from approximately 2 cm distal to the origin through the above-knee popliteal.  The mid popliteal is patent.  The distal popliteal demonstrates increasing disease and the trifurcation is heavily diseased with occlusion of the anterior tibial and its proximal one third and the peroneal just past the origin.  The posterior tibial has an aberrant takeoff in the mid  popliteal at the level of the tibial plateau and appears to be patent down to the ankle.    Following angioplasty and stent placement of the right SFA  and above-knee popliteal there is excellent result with less than 15% residual stenosis.  Summary: Successful recanalization right lower extremity for limb salvage with single-vessel runoff to the ankle                        Disposition: Patient was taken to the recovery room in stable condition having tolerated the procedure well.  Teandre Hamre, Dolores Lory 01/09/2023,3:41 PM

## 2023-01-09 NOTE — TOC Progression Note (Signed)
Transition of Care [facility]) - Progression Note    Patient Details  Name: [name] MRN: [id number] Date of Birth: [birth year]  Transition of [facility]) CM/SW [place], RN Phone Number: 01/09/2023, 10:11 AM  Clinical Narrative:   Scheduled for procedure today, TOC to follow for needs    Expected Discharge Plan: Home/Self Care Barriers to Discharge: Continued Medical Work up  Expected Discharge Plan and Services   Discharge Planning Services: CM Consult   Living arrangements for the past 2 months: Single Family Home                                       Social Determinants of Health (SDOH) Interventions SDOH Screenings   Food Insecurity: No Food Insecurity (01/06/2023)  Housing: Low Risk  (01/06/2023)  Transportation Needs: No Transportation Needs (01/06/2023)  Utilities: Not At Risk (01/06/2023)  Tobacco Use: Medium Risk (01/07/2023)    Readmission Risk Interventions     No data to display

## 2023-01-09 NOTE — Progress Notes (Signed)
Noted pt. Allergic to ancef. Messaged MD & F. Owens Shark, NP. Pt. On vancomycin already: called pharmacy & pharmacist stated "pt. Should still be covered by the drug until her dose tonight. Ancef Dc'd. Message relayed to vascular team.

## 2023-01-09 NOTE — Progress Notes (Signed)
  Subjective:  Patient ID: [name], female    DOB: Nov 28, 1951,  MRN: [id number]  A 72 y.o. female with ulceration to the right medial big toe/hallux.  Patient states she is doing okay.  She is awaiting angiogram.  No nausea fever chills vomiting.  No clinical concern for infection at this time  Objective:   Vitals:   01/09/23 0149 01/09/23 0752  BP: 133/72 122/76  Pulse: 76 65  Resp: 18 16  Temp: 98.5 F (36.9 C) 98.2 F (36.8 C)  SpO2: 93% 95%   General AA&O x3. Normal mood and affect.  Vascular Dorsalis pedis and posterior tibial pulses nonpalpable Sluggish capillary refill to all digits. Pedal hair not present present.  Neurologic Epicritic sensation grossly intact.  Dermatologic Right hallux dry stable ulceration with a stable base.  No clinical signs of infection noted.  No purulent drainage noted.  No underlying osteomyelitis clinically appreciated  Orthopedic: MMT 5/5 in dorsiflexion, plantarflexion, inversion, and eversion. Normal joint ROM without pain or crepitus.   IMPRESSION: 1. Soft tissue edema along the dorsal lateral aspect of the foot concerning for cellulitis. No drainable fluid collection to suggest an abscess. 2. No osteomyelitis of the right forefoot. 3. Severe osteoarthritis of the talonavicular joint. 4. Moderate osteoarthritis of the fourth TMT joint. 5. Mild osteoarthritis of the calcaneocuboid joint. Assessment & Plan:  Patient was evaluated and treated and all questions answered.  Right hallux ulceration without osteomyelitis -All questions and concerns were discussed with the patient in extensive detail -Patient scheduled to undergo angiogram tomorrow with vascular surgery -No surgical plans from podiatric standpoint. -Patient will continue Betadine wet-to-dry dressing -Patient will follow with the wound care center 1 week from discharge -Podiatry to sign off  Felipa Furnace, DPM  Accessible via secure chat for questions or concerns.

## 2023-01-09 NOTE — Progress Notes (Addendum)
1140 Pt transported down to vascular at this time  1553 Received report from [facility] (vascular). Made aware pt can raise head of Bed up at 5pm and may get out of bed at 530pm.  1700 Pt returned to room Alert and oriented. Transparent film and gauze to L groin CDI. Site soft and slightly tender. VSS. Daughter and granddaughter at bedside.   39 Daughter states pt was a little SOB after piece of ground beef "went down the wrong pipe" Daughter concerned because pt was wheezing slightly. When listening to lungs some slight wheezing but pt able to talk and communicate needs. Nurse offered neb treatment pt declines. Nurse will cont. To monitor. Pt in no distress.

## 2023-01-09 NOTE — Progress Notes (Signed)
1135 Attempted to call daughter x2. Pt going to be transferred to vascular for angiogram

## 2023-01-09 NOTE — Interval H&P Note (Signed)
History and Physical Interval Note:  01/09/2023 1:49 PM  [name]  has presented today for surgery, with the diagnosis of PAD with foot ulceration.  The various methods of treatment have been discussed with the patient and family. After consideration of risks, benefits and other options for treatment, the patient has consented to  Procedure(s): Lower Extremity Angiography (Right) as a surgical intervention.  The patient's history has been reviewed, patient examined, no change in status, stable for surgery.  I have reviewed the patient's chart and labs.  Questions were answered to the patient's satisfaction.     [name]

## 2023-01-09 NOTE — Care Management Important Message (Signed)
Important Message  Patient Details  Name: [name] MRN: [id number] Date of Birth: 10/27/1951   Medicare Important Message Given:  Yes     Juliann Pulse A [name] 01/09/2023, 2:43 PM

## 2023-01-10 DIAGNOSIS — L03031 Cellulitis of right toe: Secondary | ICD-10-CM | POA: Diagnosis not present

## 2023-01-10 DIAGNOSIS — I739 Peripheral vascular disease, unspecified: Secondary | ICD-10-CM | POA: Diagnosis not present

## 2023-01-10 LAB — AEROBIC CULTURE W GRAM STAIN (SUPERFICIAL SPECIMEN): Gram Stain: NONE SEEN

## 2023-01-10 NOTE — Progress Notes (Addendum)
Progress Note    [name]  YIR:[id number] DOB: Jun 12, 1951  DOA: 01/06/2023 PCP: Patient, No Pcp Per      Brief Narrative:    Medical records reviewed and are as summarized below:  [name] is a 72 y.o. female with past medical history significant for COPD, asthma, peripheral vascular disease, CAD, dyslipidemia, peripheral neuropathy, hypertension, history of low stage breast cancer s/p radiation therapy, lumpectomy and hormonal therapy, who presented to the hospital because of increasing swelling and redness of the right big toe.  She developed a wound on the right big toe about a week prior to admission.  It started as a blister that she popped. She went to see her podiatrist on 01/06/2023 and she was referred to the emergency department for further management.   She was admitted to the hospital for right foot and big toe cellulitis.    Assessment/Plan:   Principal Problem:   Cellulitis of great toe of right foot Active Problems:   PVD (peripheral vascular disease) (HCC)   Peripheral neuropathy    Body mass index is 28.79 kg/m.   Right foot, right big toe cellulitis: IV vancomycin was discontinued on 01/09/2023.  Analgesics as needed for pain.  Patient requested to see podiatrist prior to discharge.  Dr. Sherryle Lis, podiatrist on-call was notified via secure chat.  He will see patient tomorrow.  MRI of the right foot did not show any evidence of osteomyelitis or abscess.     Right foot osteoarthritis, severe osteoarthritis of the talonavicular joint, moderate osteoarthritis of 4 TMT joint, mild osteoarthritis of calcaneocuboid joint.   Peripheral vascular disease: S/p percutaneous transluminal angioplasty and stent placement to right superficial femoral artery and above-knee popliteal artery on 01/09/2023.  Continue aspirin.  She has been started on Plavix.  Follow-up with vascular surgeon.   Hypertension: Continue amlodipine.   Other comorbidities include COPD,  CAD, peripheral neuropathy, asthma, depression (no longer on Effexor)  Patient declines pharmacologic DVT prophylaxis.  She understands that she is at high risk for blood clots.  Diet Order             Diet Heart Room service appropriate? Yes; Fluid consistency: Thin  Diet effective now                            Consultants: Podiatrist Vascular surgeon  Procedures: None    Medications:    amLODipine  10 mg Oral QHS   aspirin EC  81 mg Oral Daily   carvedilol  12.5 mg Oral BID WC   chlorhexidine  60 mL Topical Once   clopidogrel  75 mg Oral Q breakfast   loratadine  10 mg Oral Daily   melatonin  10 mg Oral QHS   multivitamin with minerals  1 tablet Oral Daily   sodium chloride flush  3 mL Intravenous Q12H   Continuous Infusions:  sodium chloride       Anti-infectives (From admission, onward)    Start     Dose/Rate Route Frequency Ordered Stop   01/09/23 1009  ceFAZolin (ANCEF) IVPB 2g/100 mL premix  Status:  Discontinued        2 g 200 mL/hr over 30 Minutes Intravenous 30 min pre-op 01/09/23 1009 01/09/23 1320   01/07/23 2200  vancomycin (VANCOREADY) IVPB 1250 mg/250 mL  Status:  Discontinued        1,250 mg 166.7 mL/hr over 90 Minutes Intravenous Every 24 hours 01/07/23 0718  01/09/23 1241   01/06/23 2330  levofloxacin (LEVAQUIN) IVPB 750 mg  Status:  Discontinued        750 mg 100 mL/hr over 90 Minutes Intravenous Every 24 hours 01/06/23 2235 01/08/23 1600   01/06/23 2145  cefTRIAXone (ROCEPHIN) 1 g in sodium chloride 0.9 % 100 mL IVPB  Status:  Discontinued        1 g 200 mL/hr over 30 Minutes Intravenous Every 24 hours 01/06/23 2050 01/06/23 2235   01/06/23 2130  vancomycin (VANCOREADY) IVPB 1500 mg/300 mL        1,500 mg 150 mL/hr over 120 Minutes Intravenous  Once 01/06/23 2043 01/07/23 0036   01/06/23 2045  vancomycin (VANCOCIN) IVPB 1000 mg/200 mL premix  Status:  Discontinued        1,000 mg 200 mL/hr over 60 Minutes Intravenous  Once  01/06/23 1950 01/06/23 2043              Family Communication/Anticipated D/C date and plan/Code Status   DVT prophylaxis:      Code Status: Full Code  Family Communication: None Disposition Plan: Awaiting lower extremity angiogram   Status is: Inpatient Remains inpatient appropriate because: Cellulitis right foot on IV antibiotics       Subjective:   Interval events noted.  She has no complaints.  She said she was getting her hair washed.  CNA was at the bedside.   Objective:    Vitals:   01/09/23 2013 01/09/23 2331 01/10/23 0441 01/10/23 0847  BP: 130/87 (!) 109/57 (!) 149/85 132/70  Pulse: 74 78 85 77  Resp: 20 20 20 16   Temp: 97.7 F (36.5 C) 99.3 F (37.4 C) 98.9 F (37.2 C) 98.4 F (36.9 C)  TempSrc:      SpO2: 96% 93% 95% 95%  Weight:      Height:       No data found.  No intake or output data in the 24 hours ending 01/10/23 1510  Filed Weights   01/06/23 2110 01/09/23 1201  Weight: 71.4 kg 71.4 kg    Exam:  GEN: NAD SKIN: Warm and dry EYES: EOMI ENT: MMM CV: RRR PULM: CTA B ABD: soft, ND, NT, +BS CNS: AAO x 3, non focal EXT: No edema or tenderness.  Right big toe-right and slightly tender.  Dry necrotic wound on the medial aspect of right big toe.  Slight erythema and blister noted on dorsal aspect of right third and fourth big toes.            Data Reviewed:   I have personally reviewed following labs and imaging studies:  Labs: Labs show the following:   Basic Metabolic Panel: Recent Labs  Lab 01/06/23 2309 01/07/23 0432  NA 138 136  K 3.6 3.5  CL 105 106  CO2 24 22  GLUCOSE 129* 121*  BUN 19 16  CREATININE 0.86 0.77  CALCIUM 9.0 8.8*   GFR Estimated Creatinine Clearance: 59.7 mL/min (by C-G formula based on SCr of 0.77 mg/dL). Liver Function Tests: Recent Labs  Lab 01/06/23 2309  AST 19  ALT 17  ALKPHOS 89  BILITOT 0.7  PROT 7.5  ALBUMIN 3.7   No results for input(s): "LIPASE", "AMYLASE"  in the last 168 hours. No results for input(s): "AMMONIA" in the last 168 hours. Coagulation profile Recent Labs  Lab 01/06/23 2309  INR 1.1    CBC: Recent Labs  Lab 01/06/23 2309 01/07/23 0432  WBC 8.2 8.0  NEUTROABS 4.8  --  HGB 13.9 12.9  HCT 42.5 39.0  MCV 85.3 84.8  PLT 289 283   Cardiac Enzymes: No results for input(s): "CKTOTAL", "CKMB", "CKMBINDEX", "TROPONINI" in the last 168 hours. BNP (last 3 results) No results for input(s): "PROBNP" in the last 8760 hours. CBG: No results for input(s): "GLUCAP" in the last 168 hours. D-Dimer: No results for input(s): "DDIMER" in the last 72 hours. Hgb A1c: No results for input(s): "HGBA1C" in the last 72 hours. Lipid Profile: No results for input(s): "CHOL", "HDL", "LDLCALC", "TRIG", "CHOLHDL", "LDLDIRECT" in the last 72 hours. Thyroid function studies: No results for input(s): "TSH", "T4TOTAL", "T3FREE", "THYROIDAB" in the last 72 hours.  Invalid input(s): "FREET3" Anemia work up: No results for input(s): "VITAMINB12", "FOLATE", "FERRITIN", "TIBC", "IRON", "RETICCTPCT" in the last 72 hours. Sepsis Labs: Recent Labs  Lab 01/06/23 2309 01/07/23 0432  WBC 8.2 8.0    Microbiology Recent Results (from the past 240 hour(s))  Culture, blood (Routine X 2) w Reflex to ID Panel     Status: None (Preliminary result)   Collection Time: 01/06/23 11:09 PM   Specimen: BLOOD RIGHT ARM  Result Value Ref Range Status   Specimen Description BLOOD RIGHT ARM  Final   Special Requests   Final    BOTTLES DRAWN AEROBIC AND ANAEROBIC Blood Culture results may not be optimal due to an inadequate volume of blood received in culture bottles   Culture   Final    NO GROWTH 4 DAYS Performed at [facility], [address], [place], [place] [address]    Report Status PENDING  Incomplete  Culture, blood (Routine X 2) w Reflex to ID Panel     Status: None (Preliminary result)   Collection Time: 01/06/23 11:09 PM   Specimen: BLOOD  LEFT ARM  Result Value Ref Range Status   Specimen Description BLOOD LEFT ARM  Final   Special Requests   Final    BOTTLES DRAWN AEROBIC AND ANAEROBIC Blood Culture results may not be optimal due to an inadequate volume of blood received in culture bottles   Culture   Final    NO GROWTH 4 DAYS Performed at [facility], [address], [place], [place] [address]    Report Status PENDING  Incomplete  Aerobic Culture w Gram Stain (superficial specimen)     Status: None   Collection Time: 01/07/23 12:54 AM   Specimen: Other Source  Result Value Ref Range Status   Specimen Description OTHER  Final   Special Requests NONE  Final   Gram Stain   Final    NO WBC SEEN NO ORGANISMS SEEN Performed at [facility], [address], [place], [place] [address]    Culture   Final    RARE ENTEROBACTER CLOACAE RARE KLEBSIELLA OXYTOCA    Report Status 01/10/2023 FINAL  Final   Organism ID, Bacteria ENTEROBACTER CLOACAE  Final   Organism ID, Bacteria KLEBSIELLA OXYTOCA  Final      Susceptibility   Enterobacter cloacae - MIC*    CEFAZOLIN >=64 RESISTANT Resistant     CEFEPIME <=0.12 SENSITIVE Sensitive     CEFTAZIDIME <=1 SENSITIVE Sensitive     CIPROFLOXACIN <=0.25 SENSITIVE Sensitive     GENTAMICIN <=1 SENSITIVE Sensitive     IMIPENEM 1 SENSITIVE Sensitive     TRIMETH/SULFA <=20 SENSITIVE Sensitive     PIP/TAZO <=4 SENSITIVE Sensitive     * RARE ENTEROBACTER CLOACAE   Klebsiella oxytoca - MIC*    AMPICILLIN RESISTANT Resistant  CEFAZOLIN <=4 SENSITIVE Sensitive     CEFEPIME <=0.12 SENSITIVE Sensitive     CEFTAZIDIME <=1 SENSITIVE Sensitive     CEFTRIAXONE <=0.25 SENSITIVE Sensitive     CIPROFLOXACIN <=0.25 SENSITIVE Sensitive     GENTAMICIN <=1 SENSITIVE Sensitive     IMIPENEM <=0.25 SENSITIVE Sensitive     TRIMETH/SULFA <=20 SENSITIVE Sensitive     AMPICILLIN/SULBACTAM 4 SENSITIVE Sensitive     PIP/TAZO <=4 SENSITIVE Sensitive     * RARE KLEBSIELLA OXYTOCA     Procedures and diagnostic studies:  PERIPHERAL VASCULAR CATHETERIZATION  Result Date: 01/09/2023 See surgical note for result.              LOS: 4 days   [name]  [facility]   Pager on [email or web address]. If 7PM-7AM, please contact night-coverage at [email or web address]     01/10/2023, 3:10 PM

## 2023-01-10 NOTE — Progress Notes (Signed)
Daily Progress Note   Assessment/Planning:   POD #1 s/p R SFA/AK pop PTA+S  No access site complication: ok to remove dsg tomorrow Will order post-proc ABI Dr. Ronalee Belts will be back on Monday   Subjective  - 1 Day Post-Op   Still having pain in R foot   Objective   Vitals:   01/09/23 1639 01/09/23 2013 01/09/23 2331 01/10/23 0441  BP: 123/69 130/87 (!) 109/57 (!) 149/85  Pulse: 64 74 78 85  Resp: 18 20 20 20   Temp: (!) 97.5 F (36.4 C) 97.7 F (36.5 C) 99.3 F (37.4 C) 98.9 F (37.2 C)  TempSrc:      SpO2: 97% 96% 93% 95%  Weight:      Height:         Intake/Output Summary (Last 24 hours) at 01/10/2023 0734 Last data filed at 01/09/2023 1034 Gross per 24 hour  Intake 10 ml  Output --  Net 10 ml    VASC L groin: no hematoma, bandage dry without bleeding; R leg: bandaged, R distal leg warm and viable    Laboratory   CBC    Latest Ref Rng & Units 01/07/2023    4:32 AM 01/06/2023   11:09 PM  CBC  WBC 4.0 - 10.5 K/uL 8.0  8.2   Hemoglobin 12.0 - 15.0 g/dL 12.9  13.9   Hematocrit 36.0 - 46.0 % 39.0  42.5   Platelets 150 - 400 K/uL 283  289     BMET    Component Value Date/Time   NA 136 01/07/2023 0432   K 3.5 01/07/2023 0432   CL 106 01/07/2023 0432   CO2 22 01/07/2023 0432   GLUCOSE 121 (H) 01/07/2023 0432   BUN 16 01/07/2023 0432   CREATININE 0.77 01/07/2023 0432   CALCIUM 8.8 (L) 01/07/2023 0432   GFRNONAA >60 01/07/2023 [place], MD, FACS, FSVS Covering for [facility]: [phone]  01/10/2023, 7:34 AM

## 2023-01-10 NOTE — Plan of Care (Signed)
  Problem: Education: ?Goal: Knowledge of General Education information will improve ?Description: Including pain rating scale, medication(s)/side effects and non-pharmacologic comfort measures ?Outcome: Progressing ?  ?Problem: Health Behavior/Discharge Planning: ?Goal: Ability to manage health-related needs will improve ?Outcome: Progressing ?  ?Problem: Clinical Measurements: ?Goal: Ability to maintain clinical measurements within normal limits will improve ?Outcome: Progressing ?  ?Problem: Activity: ?Goal: Risk for activity intolerance will decrease ?Outcome: Progressing ?  ?Problem: Nutrition: ?Goal: Adequate nutrition will be maintained ?Outcome: Progressing ?  ?Problem: Pain Managment: ?Goal: General experience of comfort will improve ?Outcome: Progressing ?  ?

## 2023-01-11 DIAGNOSIS — L03031 Cellulitis of right toe: Secondary | ICD-10-CM | POA: Diagnosis not present

## 2023-01-11 DIAGNOSIS — I739 Peripheral vascular disease, unspecified: Secondary | ICD-10-CM | POA: Diagnosis not present

## 2023-01-11 LAB — CULTURE, BLOOD (ROUTINE X 2)
Culture: NO GROWTH
Culture: NO GROWTH

## 2023-01-11 LAB — BASIC METABOLIC PANEL
Anion gap: 7 (ref 5–15)
BUN: 17 mg/dL (ref 8–23)
CO2: 25 mmol/L (ref 22–32)
Calcium: 8.6 mg/dL — ABNORMAL LOW (ref 8.9–10.3)
Chloride: 105 mmol/L (ref 98–111)
Creatinine, Ser: 0.82 mg/dL (ref 0.44–1.00)
GFR, Estimated: >60 mL/min (ref >60)
Glucose, Bld: 126 mg/dL — ABNORMAL HIGH (ref 70–99)
Potassium: 3.5 mmol/L (ref 3.5–5.1)
Sodium: 137 mmol/L (ref 135–145)

## 2023-01-11 LAB — CBC WITH DIFFERENTIAL/PLATELET
Abs Immature Granulocytes: 0.04 10*3/uL (ref 0.00–0.07)
Basophils Absolute: 0.1 10*3/uL (ref 0.0–0.1)
Basophils Relative: 1 %
Eosinophils Absolute: 0.2 10*3/uL (ref 0.0–0.5)
Eosinophils Relative: 3 %
HCT: 38.1 % (ref 36.0–46.0)
Hemoglobin: 12.6 g/dL (ref 12.0–15.0)
Immature Granulocytes: 1 %
Lymphocytes Relative: 21 %
Lymphs Abs: 1.8 10*3/uL (ref 0.7–4.0)
MCH: 28.1 pg (ref 26.0–34.0)
MCHC: 33.1 g/dL (ref 30.0–36.0)
MCV: 85 fL (ref 80.0–100.0)
Monocytes Absolute: 1.2 10*3/uL — ABNORMAL HIGH (ref 0.1–1.0)
Monocytes Relative: 14 %
Neutro Abs: 5.3 10*3/uL (ref 1.7–7.7)
Neutrophils Relative %: 60 %
Platelets: 271 10*3/uL (ref 150–400)
RBC: 4.48 MIL/uL (ref 3.87–5.11)
RDW: 13.1 % (ref 11.5–15.5)
WBC: 8.5 10*3/uL (ref 4.0–10.5)
nRBC: 0 % (ref 0.0–0.2)

## 2023-01-11 LAB — LIPID PANEL
Cholesterol: 168 mg/dL (ref 0–200)
HDL: 34 mg/dL — ABNORMAL LOW (ref >40)
LDL Cholesterol: 117 mg/dL — ABNORMAL HIGH (ref 0–99)
Total CHOL/HDL Ratio: 4.9 RATIO
Triglycerides: 84 mg/dL (ref <150)
VLDL: 17 mg/dL (ref 0–40)

## 2023-01-11 NOTE — Plan of Care (Signed)
  Problem: Education: Goal: Knowledge of General Education information will improve Description: Including pain rating scale, medication(s)/side effects and non-pharmacologic comfort measures Outcome: Progressing   Problem: Health Behavior/Discharge Planning: Goal: Ability to manage health-related needs will improve Outcome: Progressing   Problem: Activity: Goal: Risk for activity intolerance will decrease Outcome: Progressing   Problem: Nutrition: Goal: Adequate nutrition will be maintained Outcome: Progressing   Problem: Elimination: Goal: Will not experience complications related to bowel motility Outcome: Progressing   Problem: Pain Managment: Goal: General experience of comfort will improve Outcome: Progressing   Problem: Safety: Goal: Ability to remain free from injury will improve Outcome: Progressing   Problem: Education: Goal: Understanding of CV disease, CV risk reduction, and recovery process will improve Outcome: Progressing

## 2023-01-11 NOTE — Progress Notes (Addendum)
  Subjective:  Patient ID: [name], female    DOB: September 17, 1951,  MRN: [id number]  Patient seen POD #2 status post angiography of right lower extremity  She is doing okay she is having some pain on the inside of the ankle, blistering on the lesser toes  Negative for chest pain and shortness of breath Fever: no Night sweats: no Weight loss: no Review of all other systems is negative Objective:   Vitals:   01/10/23 2144 01/11/23 0808  BP: 115/68 128/68  Pulse: 69 78  Resp:  15  Temp:  98 F (36.7 C)  SpO2:  95%   General AA&O x3. Normal mood and affect.  Vascular Brisk capillary fill time foot is warm and well-perfused.  Weakly palpable DP pulse, nonpalpable PT pulse  Neurologic Epicritic sensation grossly intact.  Dermatologic Stable eschar medial right hallux, no cellulitis purulence or signs of infection, serous blistering of lesser toes  Orthopedic: Slight pain on anterior medial ankle    Assessment & Plan:  Patient was evaluated and treated and all questions answered.  PAD, ulceration right hallux -Stable for discharge -No cellulitis, should suffice with topical antibiotics -Serous blisters were lanced and drained today after prep before and after with Betadine -Home wound care: Paint Betadine to blistered toes leave open to air, hallux wound dressed with mupirocin 2% topical daily and gauze with tape -WBAT in postop shoe, ordered -Follow-up with [place] in 1 to 2 weeks or at [facility] -We will follow-up on her ankle pain as an outpatient, suspect this likely is secondary to reperfusion and possible relation to previous ankle arthrodesis  Criselda Peaches, DPM  Accessible via secure chat for questions or concerns.

## 2023-01-11 NOTE — Progress Notes (Addendum)
Progress Note    [name]  XBD:[id number] DOB: 09-Aug-1951  DOA: 01/06/2023 PCP: Patient, No Pcp Per      Brief Narrative:    Medical records reviewed and are as summarized below:  [name] is a 72 y.o. female with past medical history significant for COPD, asthma, peripheral vascular disease, CAD, dyslipidemia, peripheral neuropathy, hypertension, history of low stage breast cancer s/p radiation therapy, lumpectomy and hormonal therapy, who presented to the hospital because of increasing swelling and redness of the right big toe.  She developed a wound on the right big toe about a week prior to admission.  It started as a blister that she popped. She went to see her podiatrist on 01/06/2023 and she was referred to the emergency department for further management.   She was admitted to the hospital for right foot and big toe cellulitis.    Assessment/Plan:   Principal Problem:   Cellulitis of great toe of right foot Active Problems:   PVD (peripheral vascular disease) (HCC)   Peripheral neuropathy    Body mass index is 28.79 kg/m.   Right foot, right big toe cellulitis, right foot ulceration: No need for additional antibiotics.  She was evaluated by Dr. Lilian Kapur, podiatrist. MRI of the right foot did not show any evidence of osteomyelitis or abscess.     Right foot osteoarthritis, severe osteoarthritis of the talonavicular joint, moderate osteoarthritis of 4 TMT joint, mild osteoarthritis of calcaneocuboid joint.   Peripheral vascular disease: S/p percutaneous transluminal angioplasty and stent placement to right superficial femoral artery and above-knee popliteal artery on 01/09/2023.  Continue aspirin and Plavix.  Check lipid profile. Follow-up with vascular surgeon.   Hypertension: Continue amlodipine.   Other comorbidities include COPD, CAD, peripheral neuropathy, asthma, depression (no longer on Effexor)  Patient declines pharmacologic DVT prophylaxis.   She understands that she is at high risk for blood clots.  Disposition: Discussed discharge plan with the patient.  I told her that Dr. Lilian Kapur, podiatrist, was okay with her going home today.  I sent a message via secure chat to Dr. Imogene Burn, vascular surgeon, to inquire about discharge plan.  He said patient could go home today from his standpoint.  However, patient said she wants to have repeat arterial duplex prior to discharge.  She also said her daughter wants to talk to Dr. Gilda Crease, vascular surgeon, before she takes her home.   Diet Order             Diet Heart Room service appropriate? Yes; Fluid consistency: Thin  Diet effective now                            Consultants: Podiatrist Vascular surgeon  Procedures: None    Medications:    amLODipine  10 mg Oral QHS   aspirin EC  81 mg Oral Daily   carvedilol  12.5 mg Oral BID WC   chlorhexidine  60 mL Topical Once   clopidogrel  75 mg Oral Q breakfast   loratadine  10 mg Oral Daily   melatonin  10 mg Oral QHS   multivitamin with minerals  1 tablet Oral Daily   sodium chloride flush  3 mL Intravenous Q12H   Continuous Infusions:  sodium chloride       Anti-infectives (From admission, onward)    Start     Dose/Rate Route Frequency Ordered Stop   01/09/23 1009  ceFAZolin (ANCEF) IVPB 2g/100 mL  premix  Status:  Discontinued        2 g 200 mL/hr over 30 Minutes Intravenous 30 min pre-op 01/09/23 1009 01/09/23 1320   01/07/23 2200  vancomycin (VANCOREADY) IVPB 1250 mg/250 mL  Status:  Discontinued        1,250 mg 166.7 mL/hr over 90 Minutes Intravenous Every 24 hours 01/07/23 0718 01/09/23 1241   01/06/23 2330  levofloxacin (LEVAQUIN) IVPB 750 mg  Status:  Discontinued        750 mg 100 mL/hr over 90 Minutes Intravenous Every 24 hours 01/06/23 2235 01/08/23 1600   01/06/23 2145  cefTRIAXone (ROCEPHIN) 1 g in sodium chloride 0.9 % 100 mL IVPB  Status:  Discontinued        1 g 200 mL/hr over 30 Minutes  Intravenous Every 24 hours 01/06/23 2050 01/06/23 2235   01/06/23 2130  vancomycin (VANCOREADY) IVPB 1500 mg/300 mL        1,500 mg 150 mL/hr over 120 Minutes Intravenous  Once 01/06/23 2043 01/07/23 0036   01/06/23 2045  vancomycin (VANCOCIN) IVPB 1000 mg/200 mL premix  Status:  Discontinued        1,000 mg 200 mL/hr over 60 Minutes Intravenous  Once 01/06/23 1950 01/06/23 2043              Family Communication/Anticipated D/C date and plan/Code Status   DVT prophylaxis:      Code Status: Full Code  Family Communication: None Disposition Plan: Awaiting repeat ABI testing and follow-up with vascular surgeon   Status is: Inpatient Remains inpatient appropriate because: Cellulitis right foot on IV antibiotics       Subjective:   She complains of pain on the medial aspect of the right foot otherwise she feels okay..   Objective:    Vitals:   01/10/23 0847 01/10/23 1707 01/10/23 2144 01/11/23 0808  BP: 132/70 (!) 153/67 115/68 128/68  Pulse: 77 86 69 78  Resp: 16 16  15   Temp: 98.4 F (36.9 C) 99 F (37.2 C)  98 F (36.7 C)  TempSrc:      SpO2: 95% 96%  95%  Weight:      Height:       No data found.   Intake/Output Summary (Last 24 hours) at 01/11/2023 1637 Last data filed at 01/10/2023 2144 Gross per 24 hour  Intake 3 ml  Output --  Net 3 ml    Filed Weights   01/06/23 2110 01/09/23 1201  Weight: 71.4 kg 71.4 kg    Exam:  GEN: NAD SKIN: Warm and dry EYES: Anicteric ENT: MMM CV: RRR PULM: CTA B ABD: soft, ND, NT, +BS CNS: AAO x 3, non focal EXT: Dry necrotic wound on right medial big toe with surrounding redness.  Blisters on the dorsal aspect of the right third and fourth toes.          Data Reviewed:   I have personally reviewed following labs and imaging studies:  Labs: Labs show the following:   Basic Metabolic Panel: Recent Labs  Lab 01/06/23 2309 01/07/23 0432 01/11/23 0450  NA 138 136 137  K 3.6 3.5 3.5  CL  105 106 105  CO2 24 22 25   GLUCOSE 129* 121* 126*  BUN 19 16 17   CREATININE 0.86 0.77 0.82  CALCIUM 9.0 8.8* 8.6*   GFR Estimated Creatinine Clearance: 58.2 mL/min (by C-G formula based on SCr of 0.82 mg/dL). Liver Function Tests: Recent Labs  Lab 01/06/23 2309  AST 19  ALT  17  ALKPHOS 89  BILITOT 0.7  PROT 7.5  ALBUMIN 3.7   No results for input(s): "LIPASE", "AMYLASE" in the last 168 hours. No results for input(s): "AMMONIA" in the last 168 hours. Coagulation profile Recent Labs  Lab 01/06/23 2309  INR 1.1    CBC: Recent Labs  Lab 01/06/23 2309 01/07/23 0432 01/11/23 0450  WBC 8.2 8.0 8.5  NEUTROABS 4.8  --  5.3  HGB 13.9 12.9 12.6  HCT 42.5 39.0 38.1  MCV 85.3 84.8 85.0  PLT 289 283 271   Cardiac Enzymes: No results for input(s): "CKTOTAL", "CKMB", "CKMBINDEX", "TROPONINI" in the last 168 hours. BNP (last 3 results) No results for input(s): "PROBNP" in the last 8760 hours. CBG: No results for input(s): "GLUCAP" in the last 168 hours. D-Dimer: No results for input(s): "DDIMER" in the last 72 hours. Hgb A1c: No results for input(s): "HGBA1C" in the last 72 hours. Lipid Profile: No results for input(s): "CHOL", "HDL", "LDLCALC", "TRIG", "CHOLHDL", "LDLDIRECT" in the last 72 hours. Thyroid function studies: No results for input(s): "TSH", "T4TOTAL", "T3FREE", "THYROIDAB" in the last 72 hours.  Invalid input(s): "FREET3" Anemia work up: No results for input(s): "VITAMINB12", "FOLATE", "FERRITIN", "TIBC", "IRON", "RETICCTPCT" in the last 72 hours. Sepsis Labs: Recent Labs  Lab 01/06/23 2309 01/07/23 0432 01/11/23 0450  WBC 8.2 8.0 8.5    Microbiology Recent Results (from the past 240 hour(s))  Culture, blood (Routine X 2) w Reflex to ID Panel     Status: None   Collection Time: 01/06/23 11:09 PM   Specimen: BLOOD RIGHT ARM  Result Value Ref Range Status   Specimen Description BLOOD RIGHT ARM  Final   Special Requests   Final    BOTTLES DRAWN  AEROBIC AND ANAEROBIC Blood Culture results may not be optimal due to an inadequate volume of blood received in culture bottles   Culture   Final    NO GROWTH 5 DAYS Performed at [facility], [address] [place], [place] [address]    Report Status 01/11/2023 FINAL  Final  Culture, blood (Routine X 2) w Reflex to ID Panel     Status: None   Collection Time: 01/06/23 11:09 PM   Specimen: BLOOD LEFT ARM  Result Value Ref Range Status   Specimen Description BLOOD LEFT ARM  Final   Special Requests   Final    BOTTLES DRAWN AEROBIC AND ANAEROBIC Blood Culture results may not be optimal due to an inadequate volume of blood received in culture bottles   Culture   Final    NO GROWTH 5 DAYS Performed at [facility], [address] [place], [place] [address]    Report Status 01/11/2023 FINAL  Final  Aerobic Culture w Gram Stain (superficial specimen)     Status: None   Collection Time: 01/07/23 12:54 AM   Specimen: Other Source  Result Value Ref Range Status   Specimen Description OTHER  Final   Special Requests NONE  Final   Gram Stain   Final    NO WBC SEEN NO ORGANISMS SEEN Performed at [facility], [place] [address] [place], [place] [address]    Culture   Final    RARE ENTEROBACTER CLOACAE RARE KLEBSIELLA OXYTOCA    Report Status 01/10/2023 FINAL  Final   Organism ID, Bacteria ENTEROBACTER CLOACAE  Final   Organism ID, Bacteria KLEBSIELLA OXYTOCA  Final      Susceptibility   Enterobacter cloacae - MIC*    CEFAZOLIN >=  64 RESISTANT Resistant     CEFEPIME <=0.12 SENSITIVE Sensitive     CEFTAZIDIME <=1 SENSITIVE Sensitive     CIPROFLOXACIN <=0.25 SENSITIVE Sensitive     GENTAMICIN <=1 SENSITIVE Sensitive     IMIPENEM 1 SENSITIVE Sensitive     TRIMETH/SULFA <=20 SENSITIVE Sensitive     PIP/TAZO <=4 SENSITIVE Sensitive     * RARE ENTEROBACTER CLOACAE   Klebsiella oxytoca - MIC*    AMPICILLIN RESISTANT Resistant     CEFAZOLIN <=4 SENSITIVE Sensitive      CEFEPIME <=0.12 SENSITIVE Sensitive     CEFTAZIDIME <=1 SENSITIVE Sensitive     CEFTRIAXONE <=0.25 SENSITIVE Sensitive     CIPROFLOXACIN <=0.25 SENSITIVE Sensitive     GENTAMICIN <=1 SENSITIVE Sensitive     IMIPENEM <=0.25 SENSITIVE Sensitive     TRIMETH/SULFA <=20 SENSITIVE Sensitive     AMPICILLIN/SULBACTAM 4 SENSITIVE Sensitive     PIP/TAZO <=4 SENSITIVE Sensitive     * RARE KLEBSIELLA OXYTOCA    Procedures and diagnostic studies:  No results found.             LOS: 5 days   Joshuwa Vecchio  [facility]   Pager on [email or web address]. If 7PM-7AM, please contact night-coverage at [email or web address]     01/11/2023, 4:37 PM

## 2023-01-11 NOTE — Plan of Care (Signed)

## 2023-01-12 ENCOUNTER — Encounter: Payer: Self-pay | Admitting: Vascular Surgery

## 2023-01-12 DIAGNOSIS — I739 Peripheral vascular disease, unspecified: Secondary | ICD-10-CM | POA: Diagnosis not present

## 2023-01-12 DIAGNOSIS — L03031 Cellulitis of right toe: Secondary | ICD-10-CM | POA: Diagnosis not present

## 2023-01-12 MED ORDER — CLOPIDOGREL BISULFATE 75 MG PO TABS: 75.0000 mg | ORAL_TABLET | Freq: Every day | ORAL | 0 refills | Status: DC

## 2023-01-12 MED ORDER — TRAZODONE HCL 50 MG PO TABS: 25.0000 mg | ORAL_TABLET | Freq: Every evening | ORAL | 0 refills | Status: DC | PRN

## 2023-01-12 NOTE — Care Management Important Message (Signed)
Important Message  Patient Details  Name: [name] MRN: [id number] Date of Birth: July 25, 1951   Medicare Important Message Given:  Yes     Juliann Pulse A Traves Majchrzak 01/12/2023, 11:35 AM

## 2023-01-12 NOTE — Plan of Care (Signed)
@  0600: Left femoral dressing saturated. Manual pressure applied to the area for approximately 15 minutes . Vitals WNL. Positive pedal pulses via doppler. No palpable hematoma. Denies back pain. New gauze and tegaderm applied.                                       Problem: Education: Goal: Knowledge of General Education information will improve Description: Including pain rating scale, medication(s)/side effects and non-pharmacologic comfort measures Outcome: Progressing   Problem: Health Behavior/Discharge Planning: Goal: Ability to manage health-related needs will improve Outcome: Progressing   Problem: Clinical Measurements: Goal: Ability to maintain clinical measurements within normal limits will improve Outcome: Progressing Goal: Will remain free from infection Outcome: Progressing Goal: Diagnostic test results will improve Outcome: Progressing Goal: Respiratory complications will improve Outcome: Progressing Goal: Cardiovascular complication will be avoided Outcome: Progressing   Problem: Activity: Goal: Risk for activity intolerance will decrease Outcome: Progressing   Problem: Nutrition: Goal: Adequate nutrition will be maintained Outcome: Progressing   Problem: Coping: Goal: Level of anxiety will decrease Outcome: Progressing   Problem: Elimination: Goal: Will not experience complications related to bowel motility Outcome: Progressing Goal: Will not experience complications related to urinary retention Outcome: Progressing   Problem: Pain Managment: Goal: General experience of comfort will improve Outcome: Progressing   Problem: Safety: Goal: Ability to remain free from injury will improve Outcome: Progressing   Problem: Skin Integrity: Goal: Risk for impaired skin integrity will decrease Outcome: Progressing   Problem: Education: Goal: Understanding of CV disease, CV risk reduction, and recovery process will improve Outcome: Progressing Goal:  Individualized Educational Video(s) Outcome: Progressing   Problem: Activity: Goal: Ability to return to baseline activity level will improve Outcome: Progressing   Problem: Cardiovascular: Goal: Ability to achieve and maintain adequate cardiovascular perfusion will improve Outcome: Progressing Goal: Vascular access site(s) Level 0-1 will be maintained Outcome: Progressing   Problem: Health Behavior/Discharge Planning: Goal: Ability to safely manage health-related needs after discharge will improve Outcome: Progressing

## 2023-01-12 NOTE — TOC Progression Note (Signed)
Transition of Care [facility]) - Progression Note    Patient Details  Name: [name] MRN: [id number] Date of Birth: 10/19/1951  Transition of Care [facility]) CM/SW [place], RN Phone Number: 01/12/2023, 8:50 AM  Clinical Narrative:     Home wound care: Paint Betadine to blistered toes leave open to air, hallux wound dressed with mupirocin 2% topical daily and gauze with tape, No home health needs for this as it is not a skilled need  -Follow-up with Podiatry in 1 to 2 weeks or at [facility] wound care center   Transition of Care [facility]) Screening Note   Patient Details  Name: [name] Date of Birth: [birth year]   Transition of Care [facility]) CM/SW Contact:    Conception Oms, RN Phone Number: 01/12/2023, 8:51 AM    Transition of Care Department [facility]) has reviewed patient and no TOC needs have been identified at this time. We will continue to monitor patient advancement through interdisciplinary progression rounds. If new patient transition needs arise, please place a TOC consult.    Expected Discharge Plan: Home/Self Care Barriers to Discharge: Barriers Resolved  Expected Discharge Plan and Services   Discharge Planning Services: CM Consult   Living arrangements for the past 2 months: Single Family Home                                       Social Determinants of Health (SDOH) Interventions SDOH Screenings   Food Insecurity: No Food Insecurity (01/06/2023)  Housing: Low Risk  (01/06/2023)  Transportation Needs: No Transportation Needs (01/06/2023)  Utilities: Not At Risk (01/06/2023)  Tobacco Use: Medium Risk (01/12/2023)    Readmission Risk Interventions     No data to display

## 2023-01-12 NOTE — Progress Notes (Signed)
Appointment: 01/19/23 at 11:15AM

## 2023-01-12 NOTE — Discharge Summary (Addendum)
Physician Discharge Summary   Patient: [name] MRN: [id number] DOB: [birth year]  Admit date:     01/06/2023  Discharge date: 01/12/2023  Discharge Physician: Lurene Shadow   PCP: Patient, No Pcp Per   Recommendations at discharge:   Follow-up with PCP in 1 week Follow-up with Dr. Gilda Crease, vascular surgeon, in 1 month Follow-up with podiatrist, Dr. Ardelle Anton, in 1 to 2 weeks  Discharge Diagnoses: Principal Problem:   Cellulitis of great toe of right foot Active Problems:   PVD (peripheral vascular disease) (HCC)   Peripheral neuropathy  Resolved Problems:   * No resolved hospital problems. *  Hospital Course:  [name] is a 72 y.o. female with past medical history significant for COPD, asthma, peripheral vascular disease, CAD, dyslipidemia, peripheral neuropathy, hypertension, history of low stage breast cancer s/p radiation therapy, lumpectomy and hormonal therapy, who presented to the hospital because of increasing swelling and redness of the right big toe.  She developed a wound on the right big toe about a week prior to admission.  It started as a blister that she popped. She went to see her podiatrist on 01/06/2023 and she was referred to the emergency department for further management.     She was admitted to the hospital for right foot and big toe cellulitis.  She was treated with empiric IV antibiotics.  MRI of the right foot did not show any evidence of osteomyelitis or abscess.  She underwent percutaneous transluminal angioplasty and stent placement to right superficial femoral artery and above-knee popliteal artery on 01/09/2023.  She was started on Plavix.  Lipid panel showed LDL 117, HDL 34.  Statin was recommended because of PVD and suboptimal LDL.  However, patient declined statin.  She said she had not tolerated them in the past.  I recommended switching to a different statin but she still declined.  Her condition has improved and she is deemed stable for discharge  to home.    Assessment and Plan:   Right foot, right big toe cellulitis, right foot ulceration: No need for additional antibiotics.  Steroids placed this on the right fourth and fifth toes were lanced and drained by Dr. Lilian Kapur, podiatrist, on 01/11/2023.  Weightbearing as tolerated in postop shoe (right foot). MRI of the right foot did not show any evidence of osteomyelitis or abscess.       Right foot osteoarthritis, severe osteoarthritis of the talonavicular joint, moderate osteoarthritis of 4 TMT joint, mild osteoarthritis of calcaneocuboid joint.     Peripheral vascular disease: S/p percutaneous transluminal angioplasty and stent placement to right superficial femoral artery and above-knee popliteal artery on 01/09/2023.  Continue aspirin and Plavix.  She understands the risks and benefits of dual antiplatelet therapy.  He declined statins. Follow-up with vascular surgeon in the outpatient setting.     Hypertension: Continue amlodipine.    Other comorbidities include COPD, CAD, peripheral neuropathy, asthma, depression (no longer on Effexor)         Consultants: Podiatrist, vascular surgeon Procedures performed:  percutaneous transluminal angioplasty and stent placement to right superficial femoral artery and above-knee popliteal artery on 01/09/2023.     Disposition: Home Diet recommendation:  Discharge Diet Orders (From admission, onward)     Start     Ordered   01/12/23 0000  Diet - low sodium heart healthy        01/12/23 0958           Cardiac diet DISCHARGE MEDICATION: Allergies as of 01/12/2023  Reactions   Penicillins Rash   Oxycodone    Vicodin [hydrocodone-acetaminophen]         Medication List     STOP taking these medications    budesonide-formoterol 160-4.5 MCG/ACT inhaler Commonly known as: SYMBICORT   doxycycline 100 MG tablet Commonly known as: VIBRA-TABS   montelukast 10 MG tablet Commonly known as: SINGULAIR   naproxen 500  MG tablet Commonly known as: NAPROSYN   sulfamethoxazole-trimethoprim 800-160 MG tablet Commonly known as: BACTRIM DS   tiotropium 18 MCG inhalation capsule Commonly known as: SPIRIVA   venlafaxine XR 150 MG 24 hr capsule Commonly known as: EFFEXOR-XR       TAKE these medications    acetaminophen 500 MG tablet Commonly known as: TYLENOL Take 1,000 mg by mouth at bedtime.   albuterol 108 (90 Base) MCG/ACT inhaler Commonly known as: VENTOLIN HFA Inhale 2 puffs into the lungs every 6 (six) hours as needed for wheezing or shortness of breath.   amLODipine 10 MG tablet Commonly known as: NORVASC Take 10 mg by mouth daily.   aspirin EC 81 MG tablet Take 81 mg by mouth daily. Swallow whole.   carvedilol 12.5 MG tablet Commonly known as: COREG Take 12.5 mg by mouth 2 (two) times daily with a meal.   cetirizine 10 MG chewable tablet Commonly known as: ZYRTEC Chew 10 mg by mouth daily.   clopidogrel 75 MG tablet Commonly known as: PLAVIX Take 1 tablet (75 mg total) by mouth daily with breakfast.   Melatonin 10 MG Tabs Take by mouth at bedtime.   multivitamin with minerals Tabs tablet Take 1 tablet by mouth daily.   traZODone 50 MG tablet Commonly known as: DESYREL Take 0.5 tablets (25 mg total) by mouth at bedtime as needed for sleep.               Discharge Care Instructions  (From admission, onward)           Start     Ordered   01/12/23 0000  Discharge wound care:       Comments: Paint Betadine to blistered right toes, leave open to air, hallux wound dressed with mupirocin 2% topical daily and gauze with tape Follow up at the wound care clinic in 1 week   01/12/23 6962            Follow-up Information     Schnier, Dolores Lory, MD. Go in 1 week(s).   Specialties: Vascular Surgery, Cardiology, Radiology, Vascular Surgery Why: 01/19/23 at 11:15am Contact information: [place] [address] [phone]                 Discharge Exam: Danley Danker Weights   01/06/23 2110 01/09/23 1201  Weight: 71.4 kg 71.4 kg   GEN: NAD SKIN: Warm and dry EYES: No pallor or icterus ENT: MMM CV: RRR PULM: CTA B ABD: soft, ND, NT, +BS CNS: AAO x 3, non focal EXT: Dry necrotic wound on the right medial big toe with surrounding redness.   Condition at discharge: good  The results of significant diagnostics from this hospitalization (including imaging, microbiology, ancillary and laboratory) are listed below for reference.   Imaging Studies: PERIPHERAL VASCULAR CATHETERIZATION  Result Date: 01/09/2023 See surgical note for result.  US ARTERIAL ABI (SCREENING LOWER EXTREMITY)  Result Date: 01/07/2023 CLINICAL DATA:  72 year old female with peripheral arterial disease EXAM: NONINVASIVE PHYSIOLOGIC VASCULAR STUDY OF BILATERAL LOWER EXTREMITIES TECHNIQUE: Evaluation of both lower extremities was performed at rest,  including calculation of ankle-brachial indices, multiple segmental pressure evaluation, segmental Doppler and segmental pulse volume recording. COMPARISON:  None Available. FINDINGS: Right ABI:  0.8 Left ABI:  0.65 Right Lower Extremity: Segmental Doppler at the right ankle demonstrates monophasic dorsalis pedis waveform and multiphasic posterior tibial. Left Lower Extremity: Segmental Doppler at the left ankle demonstrates monophasic posterior tibial artery waveform and multiphasic dorsalis pedis IMPRESSION: Right: Resting ABI in the mild range arterial occlusive disease. Segmental exam at the right ankle demonstrates evidence of at least tibial arterial disease in the distribution of the anterior tibial artery. Left: Resting ABI in the moderate range arterial occlusive disease. Segmental exam at the left ankle demonstrates evidence of at least tibial arterial disease in the distribution of the posterior tibial artery Signed, Yvone Neu. Miachel Roux, RPVI [facility]  [facility] Electronically Signed   By: Gilmer Mor D.O.   On: 01/07/2023 11:56   MR FOOT RIGHT WO CONTRAST  Result Date: 01/07/2023 CLINICAL DATA:  Soft tissue infection of the foot. EXAM: MRI OF THE RIGHT FOREFOOT WITHOUT CONTRAST TECHNIQUE: Multiplanar, multisequence MR imaging of the right forefoot was performed. No intravenous contrast was administered. COMPARISON:  None Available. FINDINGS: Bones/Joint/Cartilage No fracture or dislocation. Normal alignment. No joint effusion. Severe osteoarthritis of the talonavicular joint. Ankylosis of the second and third TMT joints. Moderate osteoarthritis of the fourth TMT joint. Mild osteoarthritis of the calcaneocuboid joint. Ligaments Collateral ligaments are intact.  Lisfranc ligament is intact. Muscles and Tendons Flexor, peroneal and extensor compartment tendons are intact. Generalized muscle atrophy. Soft tissue No fluid collection or hematoma. No soft tissue mass. Soft tissue edema along the dorsal lateral aspect of the foot concerning for cellulitis. IMPRESSION: 1. Soft tissue edema along the dorsal lateral aspect of the foot concerning for cellulitis. No drainable fluid collection to suggest an abscess. 2. No osteomyelitis of the right forefoot. 3. Severe osteoarthritis of the talonavicular joint. 4. Moderate osteoarthritis of the fourth TMT joint. 5. Mild osteoarthritis of the calcaneocuboid joint. Electronically Signed   By: Elige Ko M.D.   On: 01/07/2023 08:46    Microbiology: Results for orders placed or performed during the hospital encounter of 01/06/23  Culture, blood (Routine X 2) w Reflex to ID Panel     Status: None   Collection Time: 01/06/23 11:09 PM   Specimen: BLOOD RIGHT ARM  Result Value Ref Range Status   Specimen Description BLOOD RIGHT ARM  Final   Special Requests   Final    BOTTLES DRAWN AEROBIC AND ANAEROBIC Blood Culture results may not be optimal due to an inadequate volume of blood received in culture bottles    Culture   Final    NO GROWTH 5 DAYS Performed at [facility], [address] [place], [place] [address]    Report Status 01/11/2023 FINAL  Final  Culture, blood (Routine X 2) w Reflex to ID Panel     Status: None   Collection Time: 01/06/23 11:09 PM   Specimen: BLOOD LEFT ARM  Result Value Ref Range Status   Specimen Description BLOOD LEFT ARM  Final   Special Requests   Final    BOTTLES DRAWN AEROBIC AND ANAEROBIC Blood Culture results may not be optimal due to an inadequate volume of blood received in culture bottles   Culture   Final    NO GROWTH 5 DAYS Performed at [facility], [address], [place], [place] [address]    Report Status 01/11/2023  FINAL  Final  Aerobic Culture w Gram Stain (superficial specimen)     Status: None   Collection Time: 01/07/23 12:54 AM   Specimen: Other Source  Result Value Ref Range Status   Specimen Description OTHER  Final   Special Requests NONE  Final   Gram Stain   Final    NO WBC SEEN NO ORGANISMS SEEN Performed at [facility], [address] [place], [place] [address]    Culture   Final    RARE ENTEROBACTER CLOACAE RARE KLEBSIELLA OXYTOCA    Report Status 01/10/2023 FINAL  Final   Organism ID, Bacteria ENTEROBACTER CLOACAE  Final   Organism ID, Bacteria KLEBSIELLA OXYTOCA  Final      Susceptibility   Enterobacter cloacae - MIC*    CEFAZOLIN >=64 RESISTANT Resistant     CEFEPIME <=0.12 SENSITIVE Sensitive     CEFTAZIDIME <=1 SENSITIVE Sensitive     CIPROFLOXACIN <=0.25 SENSITIVE Sensitive     GENTAMICIN <=1 SENSITIVE Sensitive     IMIPENEM 1 SENSITIVE Sensitive     TRIMETH/SULFA <=20 SENSITIVE Sensitive     PIP/TAZO <=4 SENSITIVE Sensitive     * RARE ENTEROBACTER CLOACAE   Klebsiella oxytoca - MIC*    AMPICILLIN RESISTANT Resistant     CEFAZOLIN <=4 SENSITIVE Sensitive     CEFEPIME <=0.12 SENSITIVE Sensitive     CEFTAZIDIME <=1 SENSITIVE Sensitive     CEFTRIAXONE <=0.25 SENSITIVE Sensitive      CIPROFLOXACIN <=0.25 SENSITIVE Sensitive     GENTAMICIN <=1 SENSITIVE Sensitive     IMIPENEM <=0.25 SENSITIVE Sensitive     TRIMETH/SULFA <=20 SENSITIVE Sensitive     AMPICILLIN/SULBACTAM 4 SENSITIVE Sensitive     PIP/TAZO <=4 SENSITIVE Sensitive     * RARE KLEBSIELLA OXYTOCA    Labs: CBC: Recent Labs  Lab 01/06/23 2309 01/07/23 0432 01/11/23 0450  WBC 8.2 8.0 8.5  NEUTROABS 4.8  --  5.3  HGB 13.9 12.9 12.6  HCT 42.5 39.0 38.1  MCV 85.3 84.8 85.0  PLT 289 283 209   Basic Metabolic Panel: Recent Labs  Lab 01/06/23 2309 01/07/23 0432 01/11/23 0450  NA 138 136 137  K 3.6 3.5 3.5  CL 105 106 105  CO2 24 22 25   GLUCOSE 129* 121* 126*  BUN 19 16 17   CREATININE 0.86 0.77 0.82  CALCIUM 9.0 8.8* 8.6*   Liver Function Tests: Recent Labs  Lab 01/06/23 2309  AST 19  ALT 17  ALKPHOS 89  BILITOT 0.7  PROT 7.5  ALBUMIN 3.7   CBG: No results for input(s): "GLUCAP" in the last 168 hours.  Discharge time spent: greater than 30 minutes.  Signed: Jennye Boroughs, MD [facility] 01/12/2023

## 2023-01-12 NOTE — Progress Notes (Signed)
  Progress Note    01/12/2023 9:37 AM 3 Days Post-Op  Subjective:  This is a 72 y.o. female with medical history significant for asthma, COPD, PVD, coronary artery disease, dyslipidemia, peripheral neuropathy and hypertension, who presented to the emergency room with acute onset of right big toe swelling with erythema and ulceration extending to the foot.     On exam this morning patient was resting comfortably in bed eating breakfast.  She had no complications to note.  Vital signs all remained stable.  There was some confusion about vascular ultrasounds of her lower extremities this morning.  It appears that ABIs were ordered for postop by covering physician over the weekend.  I canceled these as we will obtain these when the patient returns to clinic for follow-up.  Patient was able to get her daughter on the phone and I was able to speak with her about the patient's condition status and that she would be discharged later today.  Patient's daughter states that she would be by at 2 PM to pick her up for discharge.  All of the patient and her daughter's questions were answered today.  They verbalized her understanding. Vitals:   01/12/23 0627 01/12/23 0845  BP: 118/77 (!) 139/45  Pulse: 78 86  Resp:  16  Temp:  98.3 F (36.8 C)  SpO2:  91%   Physical Exam: Cardiac:  RRR Lungs:  Clear throughout non labored Incisions:  Rt groin. Dressing C/D/I  Extremities:  Right lower extremity warm to touch. Palpable post tibial pulse Abdomen:  Positive bowel sounds, soft and NT/ND Neurologic: AAOX3  CBC    Component Value Date/Time   WBC 8.5 01/11/2023 0450   RBC 4.48 01/11/2023 0450   HGB 12.6 01/11/2023 0450   HCT 38.1 01/11/2023 0450   PLT 271 01/11/2023 0450   MCV 85.0 01/11/2023 0450   MCH 28.1 01/11/2023 0450   MCHC 33.1 01/11/2023 0450   RDW 13.1 01/11/2023 0450   LYMPHSABS 1.8 01/11/2023 0450   MONOABS 1.2 (H) 01/11/2023 0450   EOSABS 0.2 01/11/2023 0450   BASOSABS 0.1 01/11/2023  0450    BMET    Component Value Date/Time   NA 137 01/11/2023 0450   K 3.5 01/11/2023 0450   CL 105 01/11/2023 0450   CO2 25 01/11/2023 0450   GLUCOSE 126 (H) 01/11/2023 0450   BUN 17 01/11/2023 0450   CREATININE 0.82 01/11/2023 0450   CALCIUM 8.6 (L) 01/11/2023 0450   GFRNONAA >60 01/11/2023 0450    INR    Component Value Date/Time   INR 1.1 01/06/2023 2309     Intake/Output Summary (Last 24 hours) at 01/12/2023 5621 Last data filed at 01/11/2023 2143 Gross per 24 hour  Intake 3 ml  Output --  Net 3 ml     Assessment/Plan:  72 y.o. female who presents to the emergency room with acute onset of right big toe swelling with erythema and ulceration extending to the foot.  3 Days Post-Op right lower extremity angiogram.  PLAN:   Discharged home today.  Follow-up in vein and vascular clinic in 1 month with ultrasounds and ABIs.  Primary team is made aware.  They are in agreement.  Patient's daughter was spoken to on the phone today and she will be by at 2 PM to pick the patient up.   Drema Pry [facility] 01/12/2023 9:37 AM

## 2023-01-14 ENCOUNTER — Other Ambulatory Visit (INDEPENDENT_AMBULATORY_CARE_PROVIDER_SITE_OTHER): Payer: Self-pay | Admitting: Vascular Surgery

## 2023-01-14 DIAGNOSIS — Z9889 Other specified postprocedural states: Secondary | ICD-10-CM

## 2023-01-19 ENCOUNTER — Encounter (INDEPENDENT_AMBULATORY_CARE_PROVIDER_SITE_OTHER): Payer: Self-pay | Admitting: Nurse Practitioner

## 2023-01-19 ENCOUNTER — Ambulatory Visit (INDEPENDENT_AMBULATORY_CARE_PROVIDER_SITE_OTHER): Payer: Medicare Other

## 2023-01-19 ENCOUNTER — Ambulatory Visit (INDEPENDENT_AMBULATORY_CARE_PROVIDER_SITE_OTHER): Payer: Medicare Other | Admitting: Nurse Practitioner

## 2023-01-19 VITALS — BP 112/74 | HR 70 | Ht 62.0 in | Wt 157.0 lb

## 2023-01-19 DIAGNOSIS — Z9889 Other specified postprocedural states: Secondary | ICD-10-CM

## 2023-01-19 DIAGNOSIS — I739 Peripheral vascular disease, unspecified: Secondary | ICD-10-CM

## 2023-01-19 DIAGNOSIS — E782 Mixed hyperlipidemia: Secondary | ICD-10-CM

## 2023-01-19 DIAGNOSIS — I10 Essential (primary) hypertension: Secondary | ICD-10-CM | POA: Diagnosis not present

## 2023-01-19 LAB — VAS US ABI WITH/WO TBI
Left ABI: 0.76
Right ABI: 0.7

## 2023-01-19 MED ORDER — MUPIROCIN 2 % EX OINT: 1.0000 | TOPICAL_OINTMENT | Freq: Two times a day (BID) | CUTANEOUS | 3 refills | Status: DC

## 2023-01-19 NOTE — Progress Notes (Signed)
Subjective:    Patient ID: [name], female    DOB: 03/28/1951, 72 y.o.   MRN: [id number] Chief Complaint  Patient presents with  . Follow-up    HPI  Review of Systems  Cardiovascular:  Positive for leg swelling.  Skin:  Positive for wound.  All other systems reviewed and are negative.      Objective:   Physical Exam Vitals reviewed.  HENT:     Head: Normocephalic.  Cardiovascular:     Rate and Rhythm: Normal rate.     Pulses:          Dorsalis pedis pulses are detected w/ Doppler on the right side and detected w/ Doppler on the left side.       Posterior tibial pulses are detected w/ Doppler on the right side and detected w/ Doppler on the left side.  Pulmonary:     Effort: Pulmonary effort is normal.  Feet:     Left foot:     Skin integrity: Ulcer, blister and skin breakdown present.  Skin:    General: Skin is warm and dry.  Neurological:     Mental Status: She is alert and oriented to person, place, and time.  Psychiatric:        Mood and Affect: Mood normal.        Behavior: Behavior normal.        Thought Content: Thought content normal.        Judgment: Judgment normal.    BP 112/74   Pulse 70   Ht 5\' 2"  (1.575 m)   Wt 157 lb (71.2 kg)   BMI 28.72 kg/m   Past Medical History:  Diagnosis Date  . Asthma   . 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 CAD (coronary artery disease)   . COPD (chronic obstructive pulmonary disease) ([employer])   . Frequent UTI   . Kidney stones     Social History   Socioeconomic History  . Marital status: Single    Spouse name: Not on file  . Number of children: Not on file  . Years of education: Not on file  . Highest education level: Not on file  Occupational History  . Not on file  Tobacco Use  . Smoking status: Former    Packs/day: 1.00    Types: Cigarettes  . Smokeless tobacco: Never  Vaping Use  . Vaping Use: Never used  Substance and Sexual Activity  . Alcohol use: No  . Drug use:  No  . Sexual activity: Not on file  Other Topics Concern  . Not on file  Social History Narrative  . Not on file   Social Determinants of Health   Financial Resource Strain: Not on file  Food Insecurity: No Food Insecurity (01/06/2023)   Hunger Vital Sign   . Worried About Charity fundraiser in the Last Year: Never true   . Ran Out of Food in the Last Year: Never true  Transportation Needs: No Transportation Needs (01/06/2023)   PRAPARE - Transportation   . Lack of Transportation (Medical): No   . Lack of Transportation (Non-Medical): No  Physical Activity: Not on file  Stress: Not on file  Social Connections: Not on file  Intimate Partner Violence: Not At Risk (01/06/2023)   Humiliation, Afraid, Rape, and Kick questionnaire   . Fear of Current or Ex-Partner: No   . Emotionally Abused: No   . Physically Abused: No   . Sexually  Abused: No    Past Surgical History:  Procedure Laterality Date  . ANKLE FUSION Right 2017  . fibular graft  Bilateral    bilater fibular missing and replaced in hips  . HIP FRACTURE SURGERY Bilateral   . LOWER EXTREMITY ANGIOGRAPHY Right 01/09/2023   Procedure: Lower Extremity Angiography;  Surgeon: Katha Cabal, MD;  Location: [facility];  Service: Cardiovascular;  Laterality: Right;  . MASTECTOMY, PARTIAL Left 2003  . ROTATOR CUFF REPAIR Left    X 2  . TONSILLECTOMY      Family History  Adopted: Yes    Allergies  Allergen Reactions  . Penicillins Rash  . Oxycodone   . Vicodin [Hydrocodone-Acetaminophen]        Latest Ref Rng & Units 01/11/2023    4:50 AM 01/07/2023    4:32 AM 01/06/2023   11:09 PM  CBC  WBC 4.0 - 10.5 K/uL 8.5  8.0  8.2   Hemoglobin 12.0 - 15.0 g/dL 12.6  12.9  13.9   Hematocrit 36.0 - 46.0 % 38.1  39.0  42.5   Platelets 150 - 400 K/uL 271  283  289       CMP     Component Value Date/Time   NA 137 01/11/2023 0450   K 3.5 01/11/2023 0450   CL 105 01/11/2023 0450   CO2 25 01/11/2023 0450    GLUCOSE 126 (H) 01/11/2023 0450   BUN 17 01/11/2023 0450   CREATININE 0.82 01/11/2023 0450   CALCIUM 8.6 (L) 01/11/2023 0450   PROT 7.5 01/06/2023 2309   ALBUMIN 3.7 01/06/2023 2309   AST 19 01/06/2023 2309   ALT 17 01/06/2023 2309   ALKPHOS 89 01/06/2023 2309   BILITOT 0.7 01/06/2023 2309   GFRNONAA >60 01/11/2023 0450     No results found.     Assessment & Plan:   1. PAD (peripheral artery disease) ([place]) The patient is a difficult case.  She is somewhat hesitant understandably given her issues following recent angiogram.  Today her studies indicate marginal ability for wound healing in the right and the left has diminished TBI's which are also concerning.  I suspect some of the worsening of the blisters on her fourth and fifth toes may have had to do with reperfusion following her initial angiogram.  She also had noted heavy disease during the angiogram.  She will be following with podiatry shortly.  Versus moving directly to an angiogram we will allow some time to see if there is some improvement and we will plan on proceeding with noninvasive studies in 2 weeks to ascertain whether there is additional areas of stenosis that need to be treated in the right lower extremity.  However the patient is advised to contact our office if there is a sudden significant deterioration in her wounds. .  2. Essential hypertension Continue antihypertensive medications as already ordered, these medications have been reviewed and there are no changes at this time.  3. Mixed hyperlipidemia Continue statin as ordered and reviewed, no changes at this time   Current Outpatient Medications on File Prior to Visit  Medication Sig Dispense Refill  . acetaminophen (TYLENOL) 500 MG tablet Take 1,000 mg by mouth at bedtime.    Marland Kitchen albuterol (PROVENTIL HFA;VENTOLIN HFA) 108 (90 Base) MCG/ACT inhaler Inhale 2 puffs into the lungs every 6 (six) hours as needed for wheezing or shortness of breath.    Marland Kitchen amLODipine  (NORVASC) 10 MG tablet Take 10 mg by mouth daily.    Marland Kitchen  aspirin EC 81 MG tablet Take 81 mg by mouth daily. Swallow whole.    . carvedilol (COREG) 12.5 MG tablet Take 12.5 mg by mouth 2 (two) times daily with a meal.    . cetirizine (ZYRTEC) 10 MG chewable tablet Chew 10 mg by mouth daily.    . clopidogrel (PLAVIX) 75 MG tablet Take 1 tablet (75 mg total) by mouth daily with breakfast. 30 tablet 0  . Melatonin 10 MG TABS Take by mouth at bedtime.    . Multiple Vitamin (MULTIVITAMIN WITH MINERALS) TABS tablet Take 1 tablet by mouth daily.     No current facility-administered medications on file prior to visit.    There are no Patient Instructions on file for this visit. No follow-ups on file.   Georgiana Spinner, NP

## 2023-01-22 ENCOUNTER — Ambulatory Visit: Payer: Medicare Other | Admitting: Podiatry

## 2023-01-22 ENCOUNTER — Ambulatory Visit (INDEPENDENT_AMBULATORY_CARE_PROVIDER_SITE_OTHER): Payer: Medicare Other | Admitting: Podiatry

## 2023-01-22 ENCOUNTER — Encounter: Payer: Self-pay | Admitting: Podiatry

## 2023-01-22 DIAGNOSIS — G6281 Critical illness polyneuropathy: Secondary | ICD-10-CM | POA: Diagnosis not present

## 2023-01-22 DIAGNOSIS — L97519 Non-pressure chronic ulcer of other part of right foot with unspecified severity: Secondary | ICD-10-CM

## 2023-01-22 DIAGNOSIS — I739 Peripheral vascular disease, unspecified: Secondary | ICD-10-CM

## 2023-01-22 NOTE — Progress Notes (Signed)
  Subjective:  Patient ID: [name], female    DOB: [birth year],   MRN: [id number]  Chief Complaint  Patient presents with   Foot Problem    cellulitis right great toe    72 y.o. female presents for follow-up of right great toe wound and follow-up from hospital. She did undergo angiogram on 1/19 and followed up with vascular recently who express concern for healing of these wounds based on recent studies. Does relates the fourth and fifth toe blisters have appeared since hospital and were looking very wet but are drier today. Here today with daughter and granddaughter . Denies any other pedal complaints. Denies n/v/f/c.   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   CAD (coronary artery disease)    COPD (chronic obstructive pulmonary disease) (HCC)    Frequent UTI    Kidney stones     Objective:  Physical Exam: Vascular: DP/PT pulses 2/4 bilateral. CFT <3 seconds. Normal hair growth on digits. No edema.  Skin. No lacerations or abrasions bilateral feet. Right hallux medial necrotic wound upon removal of dried necrosis overlying appears to be some healthy red tissue underlying. No erythema edema or purulence noted.  Right fourth and fifth digits superficial abrasions in area of previous blister with some mild duskiness noted to the lateral fourth digit. No erythema edema or purulence noted.  Musculoskeletal: MMT 5/5 bilateral lower extremities in DF, PF, Inversion and Eversion. Deceased ROM in DF of ankle joint.  Neurological: Sensation intact to light touch.   Assessment:   1. Toe ulcer, right, with unspecified severity ([place])   2. 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 PVD (peripheral vascular disease) ([place])      Plan:  Patient was evaluated and treated and all questions answered. Ulcer right medial hallux- necrotic Lateral fourth and fifth digit abrasions -No debridement necessary  -Dressed  with betadine, DSD. -Off-loading with surgical shoe. -No abx indicated.  -Discussed glucose control and proper protein-rich diet.  -Discussed if any worsening redness, pain, fever or chills to call or may need to report to the emergency room. Patient expressed understanding.  Has follow-up in one week with Dr. Jacqualyn Posey.    No follow-ups on file.   Lorenda Peck, DPM

## 2023-01-27 ENCOUNTER — Ambulatory Visit (INDEPENDENT_AMBULATORY_CARE_PROVIDER_SITE_OTHER): Payer: Medicare Other | Admitting: Podiatry

## 2023-01-27 DIAGNOSIS — L97519 Non-pressure chronic ulcer of other part of right foot with unspecified severity: Secondary | ICD-10-CM

## 2023-01-27 DIAGNOSIS — I739 Peripheral vascular disease, unspecified: Secondary | ICD-10-CM

## 2023-02-01 NOTE — Progress Notes (Signed)
Subjective: Chief Complaint  Patient presents with   Foot Problem    cellulitis right foot seen vascular   72 year old female presents the office today for follow-up evaluation of wound on her right foot.  She continues to have wounds on the right foot.  She has not seen any pus.  She denies any fevers or chills.  She does follow with vascular surgery as well.   Objective: AAO x3, NAD Decreased pulses in the right foot.  Scab, eschar present on the medial aspect the hallux as well as the right fourth and fifth digits.  There is no significant cellulitis noted.  There is no fluctuance or crepitation but there is no malodor. No pain with calf compression, swelling, warmth, erythema   Assessment: Ulceration right foot, PAD  Plan: -All treatment options discussed with the patient including all alternatives, risks, complications.  -Given the extent of the ulcers, tissue loss I am going to refer her to the wound care center to see if there is any advanced wound care options for her.  Unfortunate high risk of amputation. -Betadine to the areas daily. -Follow-up with vascular surgery -Offloading shoe -Monitor for any clinical signs or symptoms of infection and directed to call the office immediately should any occur or go to the ER. -Patient encouraged to call the office with any questions, concerns, change in symptoms.   [name] DPM

## 2023-02-02 ENCOUNTER — Ambulatory Visit (INDEPENDENT_AMBULATORY_CARE_PROVIDER_SITE_OTHER): Payer: Medicare Other | Admitting: Podiatry

## 2023-02-02 ENCOUNTER — Telehealth: Payer: Self-pay

## 2023-02-02 DIAGNOSIS — I96 Gangrene, not elsewhere classified: Secondary | ICD-10-CM | POA: Diagnosis not present

## 2023-02-02 DIAGNOSIS — I739 Peripheral vascular disease, unspecified: Secondary | ICD-10-CM

## 2023-02-02 DIAGNOSIS — L97519 Non-pressure chronic ulcer of other part of right foot with unspecified severity: Secondary | ICD-10-CM

## 2023-02-02 MED ORDER — DOXYCYCLINE HYCLATE 100 MG PO TABS: 100.0000 mg | ORAL_TABLET | Freq: Two times a day (BID) | ORAL | 0 refills | Status: DC

## 2023-02-02 NOTE — Progress Notes (Unsigned)
Subjective: Chief Complaint  Patient presents with   Follow-up    1 week, right foot, great hallux, cellulitis     72 year old female presents the office today for follow-up evaluation of wound on her right foot.  I told her wound care supplies but she has not yet received them.  Has been keeping Betadine on the wounds.  Areas where she had blisters on her fourth and fifth toes are now turning black as well.  She denies any fevers or chills.  She has no other concerns today.    She does follow with vascular surgery as well.   Objective: AAO x3, NAD Decreased pulses. Scab, eschar present on the medial aspect the hallux as well as the right fourth and fifth digits.  Particularly on the hallux there is left eschar noted and has more granular, fibrotic..  The other eschar is dry and stable.  Clean the wound noted along the fourth interspace.  There is no exposed bone or tendon.  There is no significant cellulitis noted.  There is no fluctuance or crepitation but there is no malodor. No pain with calf compression, swelling, warmth, erythema          Assessment: Ulceration right foot, PAD  Plan: -All treatment options discussed with the patient including all alternatives, risks, complications.  -I debrided some of the loose tissue on the fourth and fifth toes today without any complications or bleeding.  The hallux actually getting a little bit better but he does not want to continue with this and keep the areas clean and dry.  Will hold off on the [facility] order and continue Betadine for now.  She does have a follow-up scheduled with the wound care center in a couple weeks as well.  Continue to follow with vascular surgery.  Unfortunately she is still at high risk of amputation and is which she is aware of.  -Doxycycline  -Monitor for any clinical signs or symptoms of infection and directed to call the office immediately should any occur or go to the ER.  No follow-ups on file.  Trula Slade DPM

## 2023-02-03 ENCOUNTER — Ambulatory Visit: Payer: Medicare Other | Admitting: Podiatry

## 2023-02-03 NOTE — Telephone Encounter (Signed)
[name] is aware

## 2023-02-05 ENCOUNTER — Telehealth: Payer: Self-pay | Admitting: *Deleted

## 2023-02-05 ENCOUNTER — Other Ambulatory Visit (INDEPENDENT_AMBULATORY_CARE_PROVIDER_SITE_OTHER): Payer: Self-pay | Admitting: Nurse Practitioner

## 2023-02-05 DIAGNOSIS — I739 Peripheral vascular disease, unspecified: Secondary | ICD-10-CM

## 2023-02-05 NOTE — Telephone Encounter (Signed)
Spoke with Kristain,([facility]) giving information per Dr Jacqualyn Posey to cancel the order for now, patient was seen this week. She verbalized understanding.

## 2023-02-05 NOTE — Telephone Encounter (Signed)
[name] w/ [facility] is needing to know if the wound was debrided, if so need those documented notes faxed to:[phone]

## 2023-02-09 ENCOUNTER — Ambulatory Visit (INDEPENDENT_AMBULATORY_CARE_PROVIDER_SITE_OTHER): Payer: Medicare Other

## 2023-02-09 ENCOUNTER — Encounter (INDEPENDENT_AMBULATORY_CARE_PROVIDER_SITE_OTHER): Payer: Self-pay | Admitting: Nurse Practitioner

## 2023-02-09 ENCOUNTER — Ambulatory Visit (INDEPENDENT_AMBULATORY_CARE_PROVIDER_SITE_OTHER): Payer: Medicare Other | Admitting: Nurse Practitioner

## 2023-02-09 DIAGNOSIS — Z9889 Other specified postprocedural states: Secondary | ICD-10-CM

## 2023-02-09 DIAGNOSIS — I739 Peripheral vascular disease, unspecified: Secondary | ICD-10-CM | POA: Diagnosis not present

## 2023-02-09 MED ORDER — CLOPIDOGREL BISULFATE 75 MG PO TABS: 75.0000 mg | ORAL_TABLET | Freq: Every day | ORAL | 11 refills | Status: DC

## 2023-02-09 NOTE — Progress Notes (Signed)
Subjective:    Patient ID: [name], female    DOB: 1951/09/04, 72 y.o.   MRN: [id number] Chief Complaint  Patient presents with   Follow-up    Follow up - 2 weeks with b.l. Art. Duplex    [name] is a 72 year old female who returns today for noninvasive studies in order to evaluate her lower extremity perfusion.  The patient has shown some minimal improvement to the wounds on her right lower extremity.  Today she underwent noninvasive studies to determine if there is any additional stenosis noted in her arteries.  She notes that the previously placed stents are patent however she does have noted monophasic waveforms beginning at the mid SFA on the right and beginning in the proximal SFA on the left.  The patient is being sent to wound care and will be seeing them in the next week or so.  She denies the development of any rest pain like symptoms.  She denies any signs symptoms of infection currently.  She recently underwent angiogram of the right lower extremity on 01/09/2023 and subsequently developed a significant bleeding episode several days afterwards.  This was eventually stopped with the use of pressure and a compression bandage.    Review of Systems  Cardiovascular:  Positive for leg swelling.  Musculoskeletal:  Positive for gait problem.  Skin:  Positive for wound.  All other systems reviewed and are negative.      Objective:   Physical Exam Vitals reviewed.  HENT:     Head: Normocephalic.  Cardiovascular:     Rate and Rhythm: Normal rate.  Pulmonary:     Effort: Pulmonary effort is normal.  Feet:     Right foot:     Skin integrity: Ulcer present.  Skin:    General: Skin is warm and dry.  Neurological:     Mental Status: She is alert and oriented to person, place, and time.  Psychiatric:        Mood and Affect: Mood normal.        Behavior: Behavior normal.        Thought Content: Thought content normal.        Judgment: Judgment normal.     BP 108/72  (BP Location: Left Arm)   Pulse 63   Resp 18   Ht 5' 2"$  (1.575 m)   Wt 162 lb (73.5 kg)   BMI 29.63 kg/m   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   CAD (coronary artery disease)    COPD (chronic obstructive pulmonary disease) (HCC)    Frequent UTI    Kidney stones     Social History   Socioeconomic History   Marital status: Single    Spouse name: Not on file   Number of children: Not on file   Years of education: Not on file   Highest education level: Not on file  Occupational History   Not on file  Tobacco Use   Smoking status: Former    Packs/day: 1.00    Types: Cigarettes   Smokeless tobacco: Never  Vaping Use   Vaping Use: Never used  Substance and Sexual Activity   Alcohol use: No   Drug use: No   Sexual activity: Not on file  Other Topics Concern   Not on file  Social History Narrative   Not on file   Social Determinants of Health   Financial  Resource Strain: Not on file  Food Insecurity: No Food Insecurity (01/06/2023)   Hunger Vital Sign    Worried About Running Out of Food in the Last Year: Never true    Ran Out of Food in the Last Year: Never true  Transportation Needs: No Transportation Needs (01/06/2023)   PRAPARE - Hydrologist (Medical): No    Lack of Transportation (Non-Medical): No  Physical Activity: Not on file  Stress: Not on file  Social Connections: Not on file  Intimate Partner Violence: Not At Risk (01/06/2023)   Humiliation, Afraid, Rape, and Kick questionnaire    Fear of Current or Ex-Partner: No    Emotionally Abused: No    Physically Abused: No    Sexually Abused: No    Past Surgical History:  Procedure Laterality Date   ANKLE FUSION Right 2017   fibular graft  Bilateral    bilater fibular missing and replaced in hips   HIP FRACTURE SURGERY Bilateral    LOWER EXTREMITY ANGIOGRAPHY Right 01/09/2023   Procedure: Lower Extremity  Angiography;  Surgeon: Katha Cabal, MD;  Location: [facility];  Service: Cardiovascular;  Laterality: Right;   MASTECTOMY, PARTIAL Left 2003   ROTATOR CUFF REPAIR Left    X 2   TONSILLECTOMY      Family History  Adopted: Yes    Allergies  Allergen Reactions   Penicillins Rash   Oxycodone    Vicodin [Hydrocodone-Acetaminophen]        Latest Ref Rng & Units 01/11/2023    4:50 AM 01/07/2023    4:32 AM 01/06/2023   11:09 PM  CBC  WBC 4.0 - 10.5 K/uL 8.5  8.0  8.2   Hemoglobin 12.0 - 15.0 g/dL 12.6  12.9  13.9   Hematocrit 36.0 - 46.0 % 38.1  39.0  42.5   Platelets 150 - 400 K/uL 271  283  289       CMP     Component Value Date/Time   NA 137 01/11/2023 0450   K 3.5 01/11/2023 0450   CL 105 01/11/2023 0450   CO2 25 01/11/2023 0450   GLUCOSE 126 (H) 01/11/2023 0450   BUN 17 01/11/2023 0450   CREATININE 0.82 01/11/2023 0450   CALCIUM 8.6 (L) 01/11/2023 0450   PROT 7.5 01/06/2023 2309   ALBUMIN 3.7 01/06/2023 2309   AST 19 01/06/2023 2309   ALT 17 01/06/2023 2309   ALKPHOS 89 01/06/2023 2309   BILITOT 0.7 01/06/2023 2309   GFRNONAA >60 01/11/2023 0450     VAS [place] ABI WITH/WO TBI  Result Date: 01/19/2023  LOWER EXTREMITY DOPPLER STUDY Patient Name:  [name]  Date of Exam:   01/19/2023 Medical Rec #: [id number]     Accession #:    [id number] Date of Birth: 1951/12/21      Patient Gender: F Patient Age:   38 years Exam Location:  [facility] Procedure:      VAS [place] ABI WITH/WO TBI Referring Phys: GREGORY SCHNIER --------------------------------------------------------------------------------  High Risk Factors: Past history of smoking.  Vascular Interventions: 01/09/2023 Percutaneous transluminal angioplasty and                         stent placement right superficial femoral artery and                         above-knee popliteal. Performing Technologist: Delorise Shiner RVT  Examination Guidelines: A complete evaluation includes at minimum,  Doppler waveform signals and systolic blood pressure reading at the level of bilateral brachial, anterior tibial, and posterior tibial arteries, when vessel segments are accessible. Bilateral testing is considered an integral part of a complete examination. Photoelectric Plethysmograph (PPG) waveforms and toe systolic pressure readings are included as required and additional duplex testing as needed. Limited examinations for reoccurring indications may be performed as noted.  ABI Findings: +---------+------------------+-----+----------+--------+ Right    Rt Pressure (mmHg)IndexWaveform  Comment  +---------+------------------+-----+----------+--------+ Brachial 122                                       +---------+------------------+-----+----------+--------+ PTA      0                 0.00 absent             +---------+------------------+-----+----------+--------+ PERO     72                0.59 monophasic         +---------+------------------+-----+----------+--------+ DP       85                0.70 monophasic         +---------+------------------+-----+----------+--------+ Great Toe42                0.34                    +---------+------------------+-----+----------+--------+ +---------+------------------+-----+----------+-------+ Left     Lt Pressure (mmHg)IndexWaveform  Comment +---------+------------------+-----+----------+-------+ Brachial 115                                      +---------+------------------+-----+----------+-------+ PTA      83                0.68 monophasic        +---------+------------------+-----+----------+-------+ DP       93                0.76 monophasic        +---------+------------------+-----+----------+-------+ Great Toe0                 0.00                   +---------+------------------+-----+----------+-------+ +-------+-----------+-----------+------------+------------+ ABI/TBIToday's ABIToday's  TBIPrevious ABIPrevious TBI +-------+-----------+-----------+------------+------------+ Right  0.70       0.34                                +-------+-----------+-----------+------------+------------+ Left   0.76       0.00                                +-------+-----------+-----------+------------+------------+  Summary: Right: Resting right ankle-brachial index indicates moderate right lower extremity arterial disease. The right toe-brachial index is abnormal. Left: Resting left ankle-brachial index indicates moderate left lower extremity arterial disease. The left toe-brachial index is abnormal. *See table(s) above for measurements and observations.  Electronically signed by [name] MD on 01/19/2023 at 5:50:48 PM.    Final        Assessment & Plan:   1. Peripheral arterial disease with history of  revascularization [facility]) I had a long discussion with the patient and her family regards to her options.  The noninvasive studies today do show that the stents are patent but the monophasic waveforms indicate that there may be some additional level of stenosis.  However the patient did have significant bleeding post angiogram previously and she is extremely apprehensive to move forward with any other intervention for fear of subsequent bleeding.  I discussed that at this point the patient does have a real risk for amputation which may include digits, a transmetatarsal treatment of possible below-knee amputation.  My recommendation was to move forward with an angiogram but the patient is very hesitant and does not wish to move in this direction at this time.  To compromise, the patient will continue to follow-up with podiatry as well as wound care.  If the patient is not making some notable improvement within the next couple of visits with podiatry, they are advised to contact our office so that we can discuss and possibly move forward with an angiogram.  However the wound continues to show  improvement, we will plan on having her return in 3 months with noninvasive studies. - VAS [place] LOWER EXTREMITY ARTERIAL DUPLEX   Current Outpatient Medications on File Prior to Visit  Medication Sig Dispense Refill   acetaminophen (TYLENOL) 500 MG tablet Take 1,000 mg by mouth at bedtime.     albuterol (PROVENTIL HFA;VENTOLIN HFA) 108 (90 Base) MCG/ACT inhaler Inhale 2 puffs into the lungs every 6 (six) hours as needed for wheezing or shortness of breath.     amLODipine (NORVASC) 10 MG tablet Take 10 mg by mouth daily.     aspirin EC 81 MG tablet Take 81 mg by mouth daily. Swallow whole.     carvedilol (COREG) 12.5 MG tablet Take 12.5 mg by mouth 2 (two) times daily with a meal.     cetirizine (ZYRTEC) 10 MG chewable tablet Chew 10 mg by mouth daily.     doxycycline (VIBRA-TABS) 100 MG tablet Take 1 tablet (100 mg total) by mouth 2 (two) times daily. 20 tablet 0   Melatonin 10 MG TABS Take by mouth at bedtime.     Multiple Vitamin (MULTIVITAMIN WITH MINERALS) TABS tablet Take 1 tablet by mouth daily.     mupirocin ointment (BACTROBAN) 2 % Apply 1 Application topically 2 (two) times daily. (Patient not taking: Reported on 02/09/2023) 22 g 3   No current facility-administered medications on file prior to visit.    There are no Patient Instructions on file for this visit. No follow-ups on file.   Kris Hartmann, NP

## 2023-02-10 ENCOUNTER — Ambulatory Visit (INDEPENDENT_AMBULATORY_CARE_PROVIDER_SITE_OTHER): Payer: Medicare Other | Admitting: Podiatry

## 2023-02-10 ENCOUNTER — Telehealth: Payer: Self-pay

## 2023-02-10 VITALS — BP 111/63 | HR 66 | Temp 97.5°F

## 2023-02-10 DIAGNOSIS — I739 Peripheral vascular disease, unspecified: Secondary | ICD-10-CM

## 2023-02-10 DIAGNOSIS — I96 Gangrene, not elsewhere classified: Secondary | ICD-10-CM

## 2023-02-10 DIAGNOSIS — L97519 Non-pressure chronic ulcer of other part of right foot with unspecified severity: Secondary | ICD-10-CM | POA: Diagnosis not present

## 2023-02-11 ENCOUNTER — Other Ambulatory Visit: Payer: Self-pay | Admitting: Podiatry

## 2023-02-12 ENCOUNTER — Telehealth: Payer: Self-pay | Admitting: *Deleted

## 2023-02-12 NOTE — Telephone Encounter (Signed)
Information has been faxed over

## 2023-02-12 NOTE — Telephone Encounter (Signed)
Information was faxed over

## 2023-02-12 NOTE — Telephone Encounter (Signed)
Fax #:[phone](Jocelin), [facility] is calling for the area of debridement  for patient, need it to finish processing order to submitt, please advise notes not complete as of yet.

## 2023-02-12 NOTE — Telephone Encounter (Signed)
This message was sent a few days ago to Dr. Jacqualyn Posey already.

## 2023-02-16 ENCOUNTER — Encounter: Payer: Medicare Other | Attending: Physician Assistant | Admitting: Physician Assistant

## 2023-02-16 DIAGNOSIS — I2510 Atherosclerotic heart disease of native coronary artery without angina pectoris: Secondary | ICD-10-CM | POA: Insufficient documentation

## 2023-02-16 DIAGNOSIS — Z7902 Long term (current) use of antithrombotics/antiplatelets: Secondary | ICD-10-CM | POA: Insufficient documentation

## 2023-02-16 DIAGNOSIS — L97512 Non-pressure chronic ulcer of other part of right foot with fat layer exposed: Secondary | ICD-10-CM | POA: Insufficient documentation

## 2023-02-16 DIAGNOSIS — Z7901 Long term (current) use of anticoagulants: Secondary | ICD-10-CM | POA: Insufficient documentation

## 2023-02-16 DIAGNOSIS — G603 Idiopathic progressive neuropathy: Secondary | ICD-10-CM | POA: Insufficient documentation

## 2023-02-17 ENCOUNTER — Ambulatory Visit: Payer: Medicare Other | Admitting: Podiatry

## 2023-02-17 NOTE — Progress Notes (Signed)
[place], [name] ([place]:[id number]) [id number] Page 1 of 5 Visit Report for 02/16/2023 Abuse Risk Screen Details Patient Name: Date of Service: [name], [name] 02/16/2023 10:30 A M Medical Record Number: [place]:[id number] Patient Account Number: [id number] Date of Birth/Sex: Treating RN: Jan 05, 1951 (72 y.o. Drema Pry Primary Care Dasie Chancellor: PA [place], [place] Other Clinician: Referring Joylynn Defrancesco: Treating Gracynn Rajewski/Extender: Luiz Ochoa in Treatment: 0 Abuse Risk Screen Items Answer Electronic Signature(s) Signed: 02/16/2023 4:58:05 PM By: Rosalio Loud MSN RN CNS WTA Entered By: Rosalio Loud on 02/16/2023 11:48:33 -------------------------------------------------------------------------------- Activities of Daily Living Details Patient Name: Date of Service: [name], [name] 02/16/2023 10:30 A M Medical Record Number: [place]:[id number] Patient Account Number: [id number] Date of Birth/Sex: Treating RN: 10-27-1951 (72 y.o. Drema Pry Primary Care Taunya Goral: PA [place]ig [place], [place] Other Clinician: Referring Barton Want: Treating Alaina Donati/Extender: Luiz Ochoa in Treatment: 0 Activities of Daily Living Items Answer Activities of Daily Living (Please select one for each item) Drive Automobile Not Able T Medications ake Need Assistance Use T elephone Completely Able Care for Appearance Need Assistance Use T oilet Completely Able Bath / Shower Completely Able Dress Self Need Assistance Feed Self Completely Able Walk Need Assistance Get In / Out Bed Need Assistance Housework Not Able Prepare Meals Not [place] Not Able Shop for Self Need Assistance [place], [place] ([place]:[id number]) [id number].pdf Page 2 of 5 Electronic Signature(s) Signed: 02/16/2023 4:58:05 PM By: Rosalio Loud MSN RN CNS WTA Entered By: Rosalio Loud on 02/16/2023  11:48:37 -------------------------------------------------------------------------------- Education Screening Details Patient Name: Date of Service: [name], [name] 02/16/2023 10:30 A M Medical Record Number: [place]:[id number] Patient Account Number: [id number] Date of Birth/Sex: Treating RN: 1951-09-01 (72 y.o. Drema Pry Primary Care Jacolyn Joaquin: PA [place], [place] Other Clinician: Referring Taneah Masri: Treating Delane Wessinger/Extender: Luiz Ochoa in Treatment: 0 Learning Preferences/Education Level/Primary Language Learning Preference: Explanation Highest Education Level: College or Above Preferred Language: English Cognitive Barrier Language Barrier: No Translator Needed: No Memory Deficit: No Emotional Barrier: No Cultural/Religious Beliefs Affecting Medical Care: No Physical Barrier Impaired Vision: Yes Glasses Impaired Hearing: No Decreased Hand dexterity: No Knowledge/Comprehension Knowledge Level: High Comprehension Level: High Ability to understand written instructions: High Ability to understand verbal instructions: High Motivation Anxiety Level: Calm Cooperation: Cooperative Education Importance: Acknowledges Need Interest in Health Problems: Asks Questions Perception: Coherent Willingness to Engage in Self-Management High Activities: Readiness to Engage in Self-Management High Activities: Electronic Signature(s) Signed: 02/16/2023 4:58:05 PM By: Rosalio Loud MSN RN CNS WTA Entered By: Rosalio Loud on 02/16/2023 11:48:41 [place], [place] ([place]:[id number]:[id number].pdf Page 3 of 5 -------------------------------------------------------------------------------- Fall Risk Assessment Details Patient Name: Date of Service: [name], [name] 02/16/2023 10:30 A M Medical Record Number: :[id number] Patient Account Number: [id number] Date of Birth/Sex: Treating RN: 31-Aug-1951 (72 y.o. Drema Pry Primary Care Griffith Santilli: PA [place]ig [place], [place] Other  Clinician: Referring Rosangelica Pevehouse: Treating Tyra Gural/Extender: Luiz Ochoa in Treatment: 0 Fall Risk Assessment Items Have you had 2 or more falls in the last 12 monthso 0 Yes Have you had any fall that resulted in injury in the last 12 monthso 0 No FALLS RISK SCREEN History of falling - immediate or within 3 months 0 No Secondary diagnosis (Do you have 2 or more medical diagnoseso) 0 No Ambulatory aid None/bed rest/wheelchair/nurse 0 Yes Crutches/cane/walker 15 Yes Furniture 0 No Intravenous therapy Access/Saline/Heparin Lock 0 No Gait/Transferring Normal/ bed rest/ wheelchair 0 Yes Weak (short steps with or without shuffle, stooped but able to lift head while walking,  may seek 10 Yes support from furniture) Impaired (short steps with shuffle, may have difficulty arising from chair, head down, impaired 0 No balance) Mental Status Oriented to own ability 0 Yes Electronic Signature(s) Signed: 02/16/2023 4:58:05 PM By: Rosalio Loud MSN RN CNS WTA Entered By: Rosalio Loud on 02/16/2023 11:48:45 -------------------------------------------------------------------------------- Foot Assessment Details Patient Name: Date of Service: [name], [name] 02/16/2023 10:30 A M Medical Record Number: [place]:[id number] Patient Account Number: [id number] Date of Birth/Sex: Treating RN: [birth year] (72 y.o. Drema Pry Primary Care Nakyah Erdmann: PA [place], [place] Other Clinician: Referring Marina Desire: Treating Tangala Wiegert/Extender: Luiz Ochoa in Treatment: 0 Foot Assessment Items Site Locations [place], [place] ([place]:[id number]) [id number] [id number].pdf Page 4 of 5 + = Sensation present, - = Sensation absent, C = Callus, U = Ulcer R = Redness, W = Warmth, M = Maceration, PU = Pre-ulcerative lesion F = Fissure, S = Swelling, D = Dryness Assessment Right: Left: Other Deformity: No No Prior Foot Ulcer: Yes No Prior Amputation: No No Charcot Joint: No  No Ambulatory Status: Ambulatory With Help Assistance Device: Wheelchair Gait: Administrator, arts) Signed: 02/16/2023 4:58:05 PM By: Rosalio Loud MSN RN CNS WTA Entered By: Rosalio Loud on 02/16/2023 11:48:54 -------------------------------------------------------------------------------- Nutrition Risk Screening Details Patient Name: Date of Service: [name], [name] 02/16/2023 10:30 A M Medical Record Number: [place]:[id number] Patient Account Number: [id number] Date of Birth/Sex: Treating RN: 1951-06-24 (72 y.o. Drema Pry Primary Care Edie Vallandingham: PA [place], [place] Other Clinician: Referring Ima Hafner: Treating Maylyn Narvaiz/Extender: Luiz Ochoa in Treatment: 0 Height (in): 62 Weight (lbs): 162 Body Mass Index (BMI): 29.6 Nutrition Risk Screening Items Score Screening NUTRITION RISK SCREEN: I have an illness or condition that made me change the kind and/or amount of food I eat 0 No I eat fewer than two meals per day 0 No I eat few fruits and vegetables, or milk products 0 No I have three or more drinks of beer, liquor or wine almost every day 0 No I have tooth or mouth problems that make it hard for me to eat 0 No [name], [name] ([place]:[id number]) [id number] [id number].pdf Page 5 of 5 I don't always have enough money to buy the food I need 0 No I eat alone most of the time 0 No I take three or more different prescribed or over-the-counter drugs a day 1 Yes Without wanting to, I have lost or gained 10 pounds in the last six months 0 No I am not always physically able to shop, cook and/or feed myself 2 Yes Nutrition Protocols Good Risk Protocol 0 No interventions needed Moderate Risk Protocol High Risk Proctocol Risk Level: Moderate Risk Score: 3 Electronic Signature(s) Signed: 02/16/2023 4:58:05 PM By: Rosalio Loud MSN RN CNS WTA Entered By: Rosalio Loud on 02/16/2023 11:48:49

## 2023-02-17 NOTE — Progress Notes (Signed)
[place], [name] ([place]:[id number]) [id number] Page 1 of 14 Visit Report for 02/16/2023 Allergy List Details Patient Name: Date of Service: [name], [name] 02/16/2023 10:30 A M Medical Record Number: [id number] Patient Account Number: [id number] Date of Birth/Sex: Treating RN: [birth year] (72 y.o. [name] Primary Care [name]: PA [name], [place] Other Clinician: Referring [name]: Treating [name]/Extender: [name] in Treatment: 0 Allergies Active Allergies penicillin Reaction: Rash Severity: Moderate Vicodin Reaction: itching Severity: Moderate oxycodone Reaction: itching Allergy Notes Electronic Signature(s) Signed: 02/16/2023 4:58:05 PM By: Rosalio Loud MSN RN CNS WTA Entered By: Rosalio Loud on 02/16/2023 11:48:14 -------------------------------------------------------------------------------- Arrival Information Details Patient Name: Date of Service: [name], [place] 02/16/2023 10:30 A M Medical Record Number: [id number] Patient Account Number: [id number] Date of Birth/Sex: Treating RN: 1951/06/10 (72 y.o. [name] Primary Care [name]: PA [name], [place] Other Clinician: Referring Jaikob Borgwardt: Treating [name]/Extender: [name] in Treatment: 0 Visit Information Patient Arrived: Wheel Chair Arrival Time: 11:05 Accompanied By: daughter Transfer Assistance: None Patient Identification VerifiedSophee Rowland, [name] ([place]:[id number]) [id number] Page 2 of 14 Secondary Verification Process Completed: Yes Patient Requires Transmission-Based Precautions: No Patient Has Alerts: Yes Patient Alerts: Patient on Blood Thinner PLAVIX ASA 81 mg R ABI 0.70 TBI 0.34 L ABI 0.76 TBI 0.00 Electronic Signature(s) Signed: 02/16/2023 3:06:29 PM By: Rosalio Loud MSN RN CNS WTA Entered By: Rosalio Loud on 02/16/2023  15:06:29 -------------------------------------------------------------------------------- Clinic Level of Care Assessment Details Patient Name: Date of Service: [name], [name] 02/16/2023 10:30 A M Medical Record Number: [place]:[id number] Patient Account Number: [id number] Date of Birth/Sex: Treating RN: 03-19-1951 (72 y.o. [name] Primary Care [name]: PA [name], NO Other Clinician: Referring [name]: Treating [name]/Extender: [name] in Treatment: 0 Clinic Level of Care Assessment Items TOOL 2 Quantity Score '[]'$  - 0 Use when only an EandM is performed on the INITIAL visit ASSESSMENTS - Nursing Assessment / Reassessment X- 1 20 General Physical Exam (combine w/ comprehensive assessment (listed just below) when performed on new pt. evals) X- 1 25 Comprehensive Assessment (HX, ROS, Risk Assessments, Wounds Hx, etc.) ASSESSMENTS - Wound and Skin A ssessment / Reassessment '[]'$  - 0 Simple Wound Assessment / Reassessment - one wound X- 4 5 Complex Wound Assessment / Reassessment - multiple wounds '[]'$  - 0 Dermatologic / Skin Assessment (not related to wound area) ASSESSMENTS - Ostomy and/or Continence Assessment and Care '[]'$  - 0 Incontinence Assessment and Management '[]'$  - 0 Ostomy Care Assessment and Management (repouching, etc.) PROCESS - Coordination of Care X - Simple Patient / Family Education for ongoing care 1 15 '[]'$  - 0 Complex (extensive) Patient / Family Education for ongoing care X- 1 10 Staff obtains Programmer, systems, Records, T Results / Process Orders est '[]'$  - 0 Staff telephones HHA, Nursing Homes / Clarify orders / etc '[]'$  - 0 Routine Transfer to another Facility (non-emergent condition) '[]'$  - 0 Routine Hospital Admission (non-emergent condition) X- 1 15 New Admissions / Biomedical engineer / Ordering NPWT Apligraf, etc. , '[]'$  - 0 Emergency Hospital Admission (emergent condition) '[]'$  - 0 Simple Discharge Coordination '[]'$  - 0 Complex  (extensive) Discharge Coordination [employer], [place] ([place]:[id number].pdf Page 3 of 14 PROCESS - Special Needs '[]'$  - 0 Pediatric / Minor Patient Management '[]'$  - 0 Isolation Patient Management '[]'$  - 0 Hearing / Language / Visual special needs '[]'$  - 0 Assessment of Community assistance (transportation, D/C planning, etc.) '[]'$  - 0 Additional assistance / Altered mentation '[]'$  - 0 Support Surface(s) Assessment (bed, cushion,  seat, etc.) INTERVENTIONS - Wound Cleansing / Measurement X- 1 5 Wound Imaging (photographs - any number of wounds) '[]'$  - 0 Wound Tracing (instead of photographs) '[]'$  - 0 Simple Wound Measurement - one wound X- 4 5 Complex Wound Measurement - multiple wounds '[]'$  - 0 Simple Wound Cleansing - one wound X- 4 5 Complex Wound Cleansing - multiple wounds INTERVENTIONS - Wound Dressings '[]'$  - 0 Small Wound Dressing one or multiple wounds '[]'$  - 0 Medium Wound Dressing one or multiple wounds X- 1 20 Large Wound Dressing one or multiple wounds '[]'$  - 0 Application of Medications - injection INTERVENTIONS - Miscellaneous '[]'$  - 0 External ear exam '[]'$  - 0 Specimen Collection (cultures, biopsies, blood, body fluids, etc.) '[]'$  - 0 Specimen(s) / Culture(s) sent or taken to Lab for analysis '[]'$  - 0 Patient Transfer (multiple staff / Civil Service fast streamer / Similar devices) '[]'$  - 0 Simple Staple / Suture removal (25 or less) '[]'$  - 0 Complex Staple / Suture removal (26 or more) '[]'$  - 0 Hypo / Hyperglycemic Management (close monitor of Blood Glucose) '[]'$  - 0 Ankle / Brachial Index (ABI) - do not check if billed separately Has the patient been seen at the hospital within the last three years: Yes Total Score: 170 Level Of Care: New/Established - Level 5 Electronic Signature(s) Signed: 02/16/2023 6:05:58 PM By: Gretta Cool, BSN, RN, CWS, Kim RN, BSN Previous Signature: 02/16/2023 4:58:05 PM Version By: Rosalio Loud MSN RN CNS WTA Entered By: Gretta Cool, BSN, RN, CWS, Kim on  02/16/2023 18:03:12 -------------------------------------------------------------------------------- Encounter Discharge Information Details Patient Name: Date of Service: [name], [place] 02/16/2023 10:30 A M Medical Record Number: [id number] Patient Account Number: [id number] Date of Birth/Sex: Treating RN: [birth year] (72 y.o. [name] Primary Care Naturi Alarid: PA Darnelle Spangle Other Clinician: TRESHA, [name] ([place]:[id number]) [id number] Page 4 of 14 Referring Hailley Byers: Treating Olia Hinderliter/Extender: [name] in Treatment: 0 Encounter Discharge Information Items Post Procedure Vitals Discharge Condition: Stable Temperature (F): 98.2 Ambulatory Status: Wheelchair Pulse (bpm): 90 Discharge Destination: Home Respiratory Rate (breaths/min): 16 Transportation: Private Auto Blood Pressure (mmHg): 121/78 Accompanied By: daughter Schedule Follow-up Appointment: Yes Clinical Summary of Care: Electronic Signature(s) Signed: 02/16/2023 4:58:05 PM By: Rosalio Loud MSN RN CNS WTA Entered By: Rosalio Loud on 02/16/2023 12:21:48 -------------------------------------------------------------------------------- Lower Extremity Assessment Details Patient Name: Date of Service: [name], [name] 02/16/2023 10:30 A M Medical Record Number: [place]:[id number] Patient Account Number: [id number] Date of Birth/Sex: Treating RN: 28-Oct-1951 (72 y.o. [name] Primary Care Dewel Lotter: PA [name], [place] Other Clinician: Referring Lynnita Somma: Treating Jaylianna Tatlock/Extender: [name] in Treatment: 0 Edema Assessment Assessed: [Left: No] [Right: No] [Left: Edema] [Right: :] Calf Left: Right: Point of Measurement: 35 cm From Medial Instep 31.5 cm Ankle Left: Right: Point of Measurement: 12 cm From Medial Instep 25 cm Vascular Assessment Pulses: Posterior Tibial Palpable: [Right:Yes] Popliteal Palpable: [Right:Yes Yes] Electronic Signature(s) Signed:  02/16/2023 4:58:05 PM By: Rosalio Loud MSN RN CNS WTA Entered By: Rosalio Loud on 02/16/2023 11:48:10 [place], [name] ([place]:[id number].pdf Page 5 of 14 -------------------------------------------------------------------------------- Multi Wound Chart Details Patient Name: Date of Service: [name], [name] 02/16/2023 10:30 A M Medical Record Number: [place]:[id number] Patient Account Number: [id number] Date of Birth/Sex: Treating RN: 1951-03-06 (72 y.o. 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 Etiology: N/A Asthma, Chronic Obstructive Asthma, Chronic Obstructive Comorbid History: Pulmonary Disease (COPD), Coronary Pulmonary Disease (COPD), Coronary Artery Disease Artery Disease 12/29/2022 12/29/2022 12/29/2022 Date Acquired: 0 0 0 Weeks of Treatment: Open Open Open Wound Status: No No No Wound Recurrence: Yes Yes Yes Pending A mputation on Presentation: 4.5x1.5x0.2 2x1.5x0.2 2.5x1.5x0.1 Measurements L x W x D (cm) 5.301 2.356 2.945 A (cm) : rea 1.06 0.471 0.295 Volume (cm) : Category/Stage III Full Thickness Without Exposed Unclassifiable Classification: Support Structures Medium Medium Medium Exudate A mount: Serosanguineous Serosanguineous Serosanguineous Exudate Type: red, brown red, brown red, brown Exudate Color: Yes No No Foul Odor A Cleansing: fter No N/A N/A Odor Anticipated Due to  Product Use: Medium (34-66%) Small (1-33%) None Present (0%) Granulation A mount: Red, Pink Red, Pink N/A Granulation Quality: Small (1-33%) Medium (34-66%) Large (67-100%) Necrotic Amount: Eschar Adherent Slough Eschar Necrotic Tissue: Fat Layer (Subcutaneous Tissue): Yes Fat Layer (Subcutaneous Tissue): Yes Fat Layer (Subcutaneous Tissue): Yes Exposed Structures: Fascia: No Fascia: No Fascia: No Tendon: No Tendon: No Tendon: No Muscle: No Muscle: No Muscle: No Joint: No Joint: No Joint: No Bone: No Bone: No Bone: No N/A None None Epithelialization: Debridement - Selective/Open Wound N/A Debridement - Selective/Open Wound Debridement: Pre-procedure Verification/Time Out 12:09 N/A 12:09 Taken: Lidocaine 4% Topical Solution N/A Lidocaine 4% Topical Solution Pain Control: Necrotic/Eschar N/A Necrotic/Eschar Tissue Debrided: Non-Viable Tissue N/A Non-Viable Tissue Level: 6.75 N/A 3.75 Debridement A (sq cm): [name], [name] ([id number].pdf Page 6 of 14 Forceps, Scissors N/A Forceps, Scissors Instrument: None N/A None Bleeding: Procedure was tolerated well N/A Procedure was tolerated well Debridement Treatment Response: 4.5x1.5x0.3 N/A 2.5x1.5x0.2 Post Debridement Measurements L x W x D (cm) 1.59 N/A 0.589 Post Debridement Volume: (cm) Category/Stage III N/A N/A Post Debridement Stage: Debridement N/A Debridement Procedures Performed: Wound Number: 4 N/A N/A Photos: N/A N/A Right, Lateral T Fourth oe N/A N/A Wound Location: Pressure Injury N/A N/A Wounding Event: Neuropathic Ulcer-Non Diabetic N/A N/A Primary Etiology: Asthma, Chronic Obstructive N/A N/A Comorbid History: Pulmonary Disease (COPD), Coronary Artery Disease 12/29/2022 N/A N/A Date Acquired: 0 N/A N/A Weeks of Treatment: Open N/A N/A Wound Status: No N/A N/A Wound Recurrence: Yes N/A N/A Pending A mputation on Presentation: 1x0.7x0.1 N/A  N/A Measurements L x W x D (cm) 0.55 N/A N/A A (cm) : rea 0.055 N/A N/A Volume (cm) : Unclassifiable N/A N/A Classification: Medium N/A N/A Exudate A mount: Serosanguineous N/A N/A Exudate Type: red, brown N/A N/A Exudate Color: No N/A N/A Foul Odor A Cleansing: fter N/A N/A N/A Odor A nticipated Due to Product Use: None Present (0%) N/A N/A Granulation A mount: N/A N/A N/A Granulation Quality: Large (67-100%) N/A N/A Necrotic A mount: Eschar N/A N/A Necrotic Tissue: Fat Layer (Subcutaneous Tissue): Yes N/A N/A Exposed Structures: Fascia: No Tendon: No Muscle: No Joint: No Bone: No None N/A N/A Epithelialization: Debridement - Selective/Open Wound N/A N/A Debridement: Pre-procedure Verification/Time Out 12:09 N/A N/A Taken: Lidocaine 4% Topical Solution N/A N/A Pain Control: Necrotic/Eschar N/A N/A Tissue Debrided: Non-Viable Tissue N/A N/A Level: 0.7 N/A N/A Debridement A (sq cm): rea Forceps, Scissors N/A N/A Instrument: None N/A N/A Bleeding: Procedure was tolerated well N/A N/A Debridement Treatment Response: 1x0.7x0.2 N/A N/A Post Debridement Measurements L x W x D (cm) 0.11 N/A N/A Post  Debridement Volume: (cm) N/A N/A N/A Post Debridement Stage: Debridement N/A N/A Procedures Performed: Treatment Notes Electronic Signature(s) Signed: 02/16/2023 4:58:05 PM By: Rosalio Loud MSN RN CNS WTA Entered By: Rosalio Loud on 02/16/2023 12:20:51 [name] ([place]:[id number].pdf Page 7 of 14 -------------------------------------------------------------------------------- Multi-Disciplinary Care Plan Details Patient Name: Date of Service: [name], [name] 02/16/2023 10:30 A M Medical Record Number: [id number] Patient Account Number: [id number] Date of Birth/Sex: Treating RN: [birth year] (72 y.o. [name] Primary Care [place] Ta: PA [name], NO Other Clinician: Referring Koryn Charlot: Treating Taliah Porche/Extender: [name] in Treatment: 0 Active Inactive Necrotic Tissue Nursing Diagnoses: Impaired tissue integrity related to necrotic/devitalized tissue Knowledge deficit related to management of necrotic/devitalized tissue Goals: Necrotic/devitalized tissue will be minimized in the wound bed Date Initiated: 02/16/2023 Target Resolution Date: 03/17/2023 Goal Status: Active Patient/caregiver will verbalize understanding of reason and process for debridement of necrotic tissue Date Initiated: 02/16/2023 Target Resolution Date: 03/17/2023 Goal Status: Active Interventions: Assess patient pain level pre-, during and post procedure and prior to discharge Provide education on necrotic tissue and debridement process Treatment Activities: Apply topical anesthetic as ordered : 02/16/2023 Excisional debridement : 02/16/2023 Notes: Orientation to the Wound Care Program Nursing Diagnoses: Knowledge deficit related to the wound healing center program Goals: Patient/caregiver will verbalize understanding of the [place] Date Initiated: 02/16/2023 Target Resolution Date: 02/28/2023 Goal Status: Active Interventions: Provide education on orientation to the wound center Notes: Wound/Skin Impairment Nursing Diagnoses: Impaired tissue integrity Knowledge deficit related to ulceration/compromised skin integrity Goals: Patient will have a decrease in wound volume by X% from date: (specify in notes) Date Initiated: 02/16/2023 Target Resolution Date: 03/17/2023 Goal Status: Active Patient/caregiver will verbalize understanding of skin care regimen Date Initiated: 02/16/2023 Target Resolution Date: 03/17/2023 Goal Status: Active [place], [name] ([place]:[id number]) [id number] Page 8 of 14 Ulcer/skin breakdown will have a volume reduction of 30% by week 4 Date Initiated: 02/16/2023 Target Resolution Date: 03/17/2023 Goal Status: Active Ulcer/skin breakdown will  have a volume reduction of 50% by week 8 Date Initiated: 02/16/2023 Target Resolution Date: 04/17/2023 Goal Status: Active Ulcer/skin breakdown will have a volume reduction of 80% by week 12 Date Initiated: 02/16/2023 Target Resolution Date: 05/17/2023 Goal Status: Active Interventions: Assess patient/caregiver ability to obtain necessary supplies Assess patient/caregiver ability to perform ulcer/skin care regimen upon admission and as needed Assess ulceration(s) every visit Provide education on ulcer and skin care Treatment Activities: Referred to DME Shraddha Lebron for dressing supplies : 02/16/2023 Skin care regimen initiated : 02/16/2023 Notes: Electronic Signature(s) Signed: 02/16/2023 2:10:07 PM By: Rosalio Loud MSN RN CNS WTA Entered By: Rosalio Loud on 02/16/2023 14:10:07 -------------------------------------------------------------------------------- Pain Assessment Details Patient Name: Date of Service: [name], [place] 02/16/2023 10:30 A M Medical Record Number: [id number] Patient Account Number: [id number] Date of Birth/Sex: Treating RN: 09/21/1951 (72 y.o. [name] Primary Care Jamita Mckelvin: PA [name], [place] Other Clinician: Referring Everardo Voris: Treating Rickeya Manus/Extender: [name] in Treatment: 0 Active Problems Location of Pain Severity and Description of Pain Patient Has Paino No Site Locations Pain Management and Medication Current Pain Management: [name], [name] ([place]:[id number].pdf Page 9 of 14 Electronic Signature(s) Signed: 02/16/2023 4:58:05 PM By: Rosalio Loud MSN RN CNS WTA Entered By: Rosalio Loud on 02/16/2023 11:09:18 -------------------------------------------------------------------------------- Patient/Caregiver Education Details Patient Name: Date of Service: [name], Jodilyn 2/26/2024andnbsp10:30 [place] Record Number: :[id number] Patient Account Number: [id number] Date of Birth/Gender: Treating  RN: 23-Jan-1951 (72 y.o. [name] Primary Care Physician: PA [place], [place] Other  Clinician: Referring Physician: Treating Physician/Extender: [name] in Treatment: 0 Education Assessment Education Provided To: Patient Education Topics Provided Welcome T [facility]-New Patient Packet: o Handouts: [place] o Methods: Explain/Verbal Responses: State content correctly Wound Debridement: Handouts: Wound Debridement Methods: Explain/Verbal Responses: State content correctly Wound/Skin Impairment: Handouts: Caring for Your Ulcer Methods: Explain/Verbal Responses: State content correctly Electronic Signature(s) Signed: 02/16/2023 4:58:05 PM By: Rosalio Loud MSN RN CNS WTA Entered By: Rosalio Loud on 02/16/2023 12:20:32 -------------------------------------------------------------------------------- Wound Assessment Details Patient Name: Date of Service: [name], [place] 02/16/2023 10:30 A M Medical Record Number: [id number] Patient Account Number: [id number] Date of Birth/Sex: Treating RN: 1951/09/06 (72 y.o. [name] [place], [place] ([place]:[id number]) [id number] Page 10 of 14 Primary Care Haillee Johann: PA [place], NO Other Clinician: Referring Kaina Orengo: Treating Caila Cirelli/Extender: [name] in Treatment: 0 Wound Status Wound Number: 1 Primary Neuropathic Ulcer-Non Diabetic Etiology: Wound Location: Right, Lateral T Great oe Wound Open Wounding Event: Gradually Appeared Status: Date Acquired: 12/29/2022 Comorbid Asthma, Chronic Obstructive Pulmonary Disease (COPD), Weeks Of Treatment: 0 History: Coronary Artery Disease Clustered Wound: No Pending Amputation On Presentation Photos Wound Measurements Length: (cm) Width: (cm) Depth: (cm) Area: (cm) Volume: (cm) 4.5 % Reduction in Area: 1.5 % Reduction in Volume: 0.2 5.301 1.06 Wound Description Classification:  Full Thickness Without Exposed Suppor Exudate Amount: None Present t Structures Foul Odor After Cleansing: Yes Due to Product Use: No Slough/Fibrino Yes Wound Bed Granulation Amount: Medium (34-66%) Exposed Structure Granulation Quality: Red, Pink Fascia Exposed: No Necrotic Amount: Small (1-33%) Fat Layer (Subcutaneous Tissue) Exposed: Yes Necrotic Quality: Eschar Tendon Exposed: No Muscle Exposed: No Joint Exposed: No Bone Exposed: No Electronic Signature(s) Signed: 02/16/2023 2:10:47 PM By: Rosalio Loud MSN RN CNS WTA Entered By: Rosalio Loud on 02/16/2023 14:10:46 -------------------------------------------------------------------------------- Wound Assessment Details Patient Name: Date of Service: [name], [place] 02/16/2023 10:30 A M Medical Record Number: [id number] Patient Account Number: [id number] Date of Birth/Sex: Treating RN: 1951-07-04 (72 y.o. [name] Primary Care Mishael Krysiak: PA [name], [place] Other Clinician: Referring Javell Blackburn: Treating Jamirra Curnow/Extender: Etta Quill [place], [place] ([place]:[id number]) [id number] Page 11 of 14 Weeks in Treatment: 0 Wound Status Wound Number: 2 Primary Neuropathic Ulcer-Non Diabetic Etiology: Wound Location: Right, Lateral Foot Wound Open Wounding Event: Pressure Injury Status: Date Acquired: 12/29/2022 Comorbid Asthma, Chronic Obstructive Pulmonary Disease (COPD), Weeks Of Treatment: 0 History: Coronary Artery Disease Clustered Wound: No Photos Wound Measurements Length: (cm) 2 Width: (cm) 1.5 Depth: (cm) 0.2 Area: (cm) 2.356 Volume: (cm) 0.471 % Reduction in Area: % Reduction in Volume: Epithelialization: None Wound Description Classification: Full Thickness Without Exposed Suppo Exudate Amount: Medium Exudate Type: Serosanguineous Exudate Color: red, brown rt Structures Foul Odor After Cleansing: No Slough/Fibrino Yes Wound Bed Granulation Amount: Small (1-33%) Exposed  Structure Granulation Quality: Red, Pink Fascia Exposed: No Necrotic Amount: Medium (34-66%) Fat Layer (Subcutaneous Tissue) Exposed: Yes Necrotic Quality: Adherent Slough Tendon Exposed: No Muscle Exposed: No Joint Exposed: No Bone Exposed: No Electronic Signature(s) Signed: 02/16/2023 4:58:05 PM By: Rosalio Loud MSN RN CNS WTA Entered By: Rosalio Loud on 02/16/2023 11:46:55 -------------------------------------------------------------------------------- Wound Assessment Details Patient Name: Date of Service: [name], [place] 02/16/2023 10:30 A M Medical Record Number: [id number] Patient Account Number: [id number] Date of Birth/Sex: Treating RN: Jun 02, 1951 (72 y.o. [name] Primary Care Kashon Kraynak: PA [name], [place] Other Clinician: Referring Lorina Duffner: Treating Sobia Karger/Extender: [name] in Treatment: [place], [place] ([place]:[id number]) [id number] Page 12 of 14  Wound Status Wound Number: 3 Primary Neuropathic Ulcer-Non Diabetic Etiology: Wound Location: Right, Lateral T Fifth oe Wound Open Wounding Event: Pressure Injury Status: Date Acquired: 12/29/2022 Comorbid Asthma, Chronic Obstructive Pulmonary Disease (COPD), Weeks Of Treatment: 0 History: Coronary Artery Disease Clustered Wound: No Photos Wound Measurements Length: (cm) 2.5 Width: (cm) 1.5 Depth: (cm) 0.1 Area: (cm) 2.945 Volume: (cm) 0.295 % Reduction in Area: % Reduction in Volume: Epithelialization: None Wound Description Classification: Unclassifiable Exudate Amount: Medium Exudate Type: Serosanguineous Exudate Color: red, brown Foul Odor After Cleansing: No Slough/Fibrino No Wound Bed Granulation Amount: None Present (0%) Exposed Structure Necrotic Amount: Large (67-100%) Fascia Exposed: No Necrotic Quality: Eschar Fat Layer (Subcutaneous Tissue) Exposed: Yes Tendon Exposed: No Muscle Exposed: No Joint Exposed: No Bone Exposed: No Electronic  Signature(s) Signed: 02/16/2023 4:58:05 PM By: Rosalio Loud MSN RN CNS WTA Entered By: Rosalio Loud on 02/16/2023 11:47:24 -------------------------------------------------------------------------------- Wound Assessment Details Patient Name: Date of Service: [name], [place] 02/16/2023 10:30 A M Medical Record Number: [id number]:[id number] Patient Account Number: [id number] Date of Birth/Sex: Treating RN: 21-Aug-1951 (72 y.o. [name] Primary Care Bradyn Vassey: PA [name], [place] Other Clinician: Referring Nikholas Geffre: Treating Shylin Keizer/Extender: [name] in Treatment: 0 Wound Status Wound Number: 4 Primary Neuropathic Ulcer-Non Diabetic Etiology: [name], [name] ([place]:[id number].pdf Page 13 of 14 Etiology: Wound Location: Right, Lateral T Fourth oe Wound Open Wounding Event: Pressure Injury Status: Date Acquired: 12/29/2022 Comorbid Asthma, Chronic Obstructive Pulmonary Disease (COPD), Weeks Of Treatment: 0 History: Coronary Artery Disease Clustered Wound: No Photos Wound Measurements Length: (cm) 1 Width: (cm) 0.7 Depth: (cm) 0.1 Area: (cm) 0.55 Volume: (cm) 0.055 % Reduction in Area: % Reduction in Volume: Epithelialization: None Wound Description Classification: Unclassifiable Exudate Amount: Medium Exudate Type: Serosanguineous Exudate Color: red, brown Foul Odor After Cleansing: No Slough/Fibrino No Wound Bed Granulation Amount: None Present (0%) Exposed Structure Necrotic Amount: Large (67-100%) Fascia Exposed: No Necrotic Quality: Eschar Fat Layer (Subcutaneous Tissue) Exposed: Yes Tendon Exposed: No Muscle Exposed: No Joint Exposed: No Bone Exposed: No Electronic Signature(s) Signed: 02/16/2023 4:58:05 PM By: Rosalio Loud MSN RN CNS WTA Entered By: Rosalio Loud on 02/16/2023 11:47:59 -------------------------------------------------------------------------------- Vitals Details Patient Name: Date of Service: [name],  [place] 02/16/2023 10:30 A M Medical Record Number: [id number] Patient Account Number: [id number] Date of Birth/Sex: Treating RN: May 16, 1951 (72 y.o. [name] Primary Care Jemina Scahill: [place] [name], NO Other Clinician: Referring Clara Herbison: Treating Sterling Ucci/Extender: [name] in Treatment: 0 Vital Signs Time Taken: 11:09 Temperature (F): 98.2 Height (in): 62 Pulse (bpm): 90 Source: Stated Respiratory Rate (breaths/min): 16 [place], Nathaniel ([place]:[id number].pdf Page 14 of 14 Weight (lbs): 162 Blood Pressure (mmHg): 121/78 Body Mass Index (BMI): 29.6 Reference Range: 80 - 120 mg / dl Electronic Signature(s) Signed: 02/16/2023 4:58:05 PM By: Rosalio Loud MSN RN CNS WTA Entered By: Rosalio Loud on 02/16/2023 11:10:09

## 2023-02-17 NOTE — Progress Notes (Signed)
Subjective: Chief Complaint  Patient presents with   Foot Ulcer    Patient seen vascular yesterday, reccommends and angiogram  and also patient has a wound care appointment next Monday as well.     72 year old female presents the office today for follow-up evaluation of wound on her right foot.  She did see vascular surgery and recommended angiogram.  She has upcoming wound care appointment next week as well.  She been keeping Betadine on the wounds.  She denies any drainage or pus.  No increased swelling or redness.  No pain.  No fevers or chills.    Objective: AAO x3, NAD Decreased pulses. Scab, eschar present on the medial aspect the hallux as well as the right fourth and fifth digits.  It may be stable.  Some fibrotic tissue present medial aspect of the hallux.  There is no drainage or pus noted today.  There is no fluctuance or crepitation present.  Mild edema but is now significant cellulitis present.  No pain with calf compression, swelling, warmth, erythema  Assessment: Ulceration right foot, PAD  Plan: -All treatment options discussed with the patient including all alternatives, risks, complications.  -I again debrided some of the loose tissue on the fourth and fifth toes today without any complications or bleeding.  For now we will continue Betadine dressing changes daily.  I gave her supplies and also order supplies through [facility].  She has an upcoming appointment next week with the wound care center.  Unfortunately she is at very high risk of TMA, amputation which we have discussed on several occasions.  I have recommended to proceed with angio with vascular surgery as recommended.  We discussed pros and cons of this. -Monitor for any clinical signs or symptoms of infection and directed to call the office immediately should any occur or go to the ER.  No follow-ups on file.  Trula Slade DPM

## 2023-02-17 NOTE — Progress Notes (Signed)
[place], [name] ([place]:[id number]) [id number] Page 1 of 12 Visit Report for 02/16/2023 Chief Complaint Document Details Patient Name: Date of Service: [name], Rowland 02/16/2023 10:30 A M Medical Record Number: [name]:[id number] Patient Account Number: [id number] Date of Birth/Sex: Treating RN: 1951-02-03 (72 y.o. Drema Pry Primary Care Provider: PA Haig Prophet, [place] Other Clinician: Referring Provider: Treating Provider/Extender: Luiz Ochoa in Treatment: 0 Information Obtained from: Patient Chief Complaint Right foot ulcers Electronic Signature(s) Signed: 02/16/2023 1:26:47 PM By: Worthy Keeler PA-C Entered By: Worthy Keeler on 02/16/2023 13:26:47 -------------------------------------------------------------------------------- Debridement Details Patient Name: Date of Service: [name], [place] 02/16/2023 10:30 A M Medical Record Number: [id number] Patient Account Number: [id number] Date of Birth/Sex: Treating RN: [birth year] (72 y.o. Drema Pry Primary Care Provider: PA Haig Prophet, [place] Other Clinician: Referring Provider: Treating Provider/Extender: Luiz Ochoa in Treatment: 0 Debridement Performed for Assessment: Wound #1 Right,Lateral T Great oe Performed By: Physician Tommie Sams., PA-C Debridement Type: Debridement Level of Consciousness (Pre-procedure): Awake and Alert Pre-procedure Verification/Time Out Yes - 12:09 Taken: Start Time: 12:09 Pain Control: Lidocaine 4% T opical Solution T Area Debrided (L x W): otal 4.5 (cm) x 1.5 (cm) = 6.75 (cm) Tissue and other material debrided: Non-Viable, Eschar Level: Non-Viable Tissue Debridement Description: Selective/Open Wound Instrument: Forceps, Scissors Bleeding: None Response to Treatment: Procedure was tolerated well Level of Consciousness (Post- Awake and Alert procedure): Post Debridement Measurements of Total Wound Rowland, Victoria ([place]:[id number].pdf Page 2 of 12 Length: (cm) 4.5 Stage: Category/Stage III Width: (cm) 1.5 Depth: (cm) 0.3 Volume: (cm) 1.59 Character of Wound/Ulcer Post Debridement: Requires Further Debridement Post Procedure Diagnosis Same as Pre-procedure Electronic Signature(s) Signed: 02/16/2023 4:58:05 PM By: Rosalio Loud MSN RN CNS WTA Signed: 02/16/2023 5:34:14 PM By: Worthy Keeler PA-C Entered By: Rosalio Loud on 02/16/2023 12:12:24 -------------------------------------------------------------------------------- Debridement Details Patient Name: Date of Service: [name], [place] 02/16/2023 10:30 A M Medical Record Number: [id number] Patient Account Number: [id number] Date of Birth/Sex: Treating RN: [birth year] (72 y.o. Drema Pry Primary Care Provider: PA Haig Prophet, [place] Other Clinician: Referring Provider: Treating Provider/Extender: Luiz Ochoa in Treatment: 0 Debridement Performed for Assessment: Wound #3 Right,Lateral T Fifth oe Performed By: Physician Tommie Sams., PA-C Debridement Type: Debridement Level of Consciousness (Pre-procedure): Awake and Alert Pre-procedure Verification/Time Out Yes - 12:09 Taken: Start Time: 12:09 Pain Control: Lidocaine 4% T opical Solution T Area Debrided (L x W): otal 2.5 (cm) x 1.5 (cm) = 3.75 (cm) Tissue and other material debrided: Non-Viable, Eschar Level: Non-Viable Tissue Debridement Description: Selective/Open Wound Instrument: Forceps, Scissors Bleeding: None Response to Treatment: Procedure was tolerated well Level of Consciousness (Post- Awake and Alert procedure): Post Debridement Measurements of Total Wound Length: (cm) 2.5 Width: (cm) 1.5 Depth: (cm) 0.2 Volume: (cm) 0.589 Character of Wound/Ulcer Post Debridement: Requires Further Debridement Post Procedure Diagnosis Same as Pre-procedure Electronic Signature(s) Signed: 02/16/2023 4:58:05 PM By: Rosalio Loud MSN RN CNS  WTA Signed: 02/16/2023 5:34:14 PM By: Worthy Keeler PA-C Entered By: Rosalio Loud on 02/16/2023 12:13:15 [name] ([place]:[id number].pdf Page 3 of 12 -------------------------------------------------------------------------------- Debridement Details Patient Name: Date of Service: [name] 02/16/2023 10:30 A M Medical Record Number: [id number] Patient Account Number: [id number] Date of Birth/Sex: Treating RN: 1951/07/23 (72 y.o. Drema Pry Primary Care Provider: PA Haig Prophet, [place] Other Clinician: Referring Provider: Treating Provider/Extender: Luiz Ochoa in Treatment: 0 Debridement Performed for Assessment: Wound #4 Right,Lateral T Fourth oe Performed By: Physician Joaquim Lai,  Shirlee Latch., PA-C Debridement Type: Debridement Level of Consciousness (Pre-procedure): Awake and Alert Pre-procedure Verification/Time Out Yes - 12:09 Taken: Start Time: 12:09 Pain Control: Lidocaine 4% T opical Solution T Area Debrided (L x W): otal 1 (cm) x 0.7 (cm) = 0.7 (cm) Tissue and other material debrided: Non-Viable, Eschar Level: Non-Viable Tissue Debridement Description: Selective/Open Wound Instrument: Forceps, Scissors Bleeding: None Response to Treatment: Procedure was tolerated well Level of Consciousness (Post- Awake and Alert procedure): Post Debridement Measurements of Total Wound Length: (cm) 1 Width: (cm) 0.7 Depth: (cm) 0.2 Volume: (cm) 0.11 Character of Wound/Ulcer Post Debridement: Requires Further Debridement Post Procedure Diagnosis Same as Pre-procedure Electronic Signature(s) Signed: 02/16/2023 4:58:05 PM By: Rosalio Loud MSN RN CNS WTA Signed: 02/16/2023 5:34:14 PM By: Worthy Keeler PA-C Entered By: Rosalio Loud on 02/16/2023 12:13:56 -------------------------------------------------------------------------------- HPI Details Patient Name: Date of Service: [name], [place] 02/16/2023 10:30 A M Medical Record  Number: [id number] Patient Account Number: [id number] Date of Birth/Sex: Treating RN: Sep 06, 1951 (72 y.o. Drema Pry Primary Care Provider: PA Haig Prophet, [place] Other Clinician: Referring Provider: Treating Provider/Extender: Luiz Ochoa in Treatment: 0 [place], [place] ([place]:[id number]) [id number] Page 4 of 12 History of Present Illness HPI Description: 02-16-2023 upon evaluation today patient presents for initial inspection here in our clinic concerning issues that she has been having with wounds over the right foot in the realm of several toes as well as the lateral portion of her foot fifth metatarsal location. Subsequently she unfortunately has somewhat poor arterial flow. I do have notes that I reviewed in epic and subsequently the patient did have an angiogram as well back in January. The testing following the angiogram revealed after stent placement on 01-09-2023 that she subsequently still had somewhat compromised arterial flow following with a right ABI of 0.70 with a TBI of 0.34 and on the left an ABI of 0.76 and the TBI could not be measured. 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 With that being said the follow-up as far as her arterial testing they did not do ABIs and TBI's but it showed that she was monophasic pretty much throughout and in general I think this still shows that there is a significant issue here and the vascular doctors seem to be in agreement as far as this is concerned they have recommended that she really needs to have repeat arterial angiogram with likely intervention to try to improve that monophasic flow. With that being said the patient is somewhat reluctant to do this though I think that she is leaning towards it even though she had a lot of bleeding issues in the groin area following the last time she had this done. In regard to her wound she is actually having quite a bit of an issue here with these wounds specifically in  the realm of getting them to turn around and heal appropriately. She tells me that the areas are looking much better than they were but nonetheless she still is noticing that the Betadine seems to be keeping things a little bit dry but some areas are starting to drain a lot more unfortunately. She is on aspirin and Plavix following the stent placement. This is probably part of the bleeding issues that she had previously in the groin to be peripherally honest. She does have a history of COPD and coronary artery disease as well. Electronic Signature(s) Signed: 02/16/2023 1:28:26 PM By: Worthy Keeler PA-C Entered By: Worthy Keeler on 02/16/2023 13:28:26 -------------------------------------------------------------------------------- Physical Exam  Details Patient Name: Date of Service: [place]ANE, [place] 02/16/2023 10:30 A M Medical Record Number: [id number] Patient Account Number: [id number] Date of Birth/Sex: Treating RN: 1951-05-06 (72 y.o. Drema Pry Primary Care Provider: PA Haig Prophet, [place] Other Clinician: Referring Provider: Treating Provider/Extender: Luiz Ochoa in Treatment: 0 Constitutional sitting or standing blood pressure is within target range for patient.. pulse regular and within target range for patient.Marland Kitchen respirations regular, non-labored and within target range for patient.Marland Kitchen temperature within target range for patient.. Well-nourished and well-hydrated in no acute distress. Eyes conjunctiva clear no eyelid edema noted. pupils equal round and reactive to light and accommodation. Ears, Nose, Mouth, and Throat no gross abnormality of ear auricles or external auditory canals. normal hearing noted during conversation. mucus membranes moist. Respiratory normal breathing without difficulty. Cardiovascular Absent posterior tibial and dorsalis pedis pulses bilateral lower extremities. no clubbing, cyanosis, significant edema, <3 sec cap  refill. Musculoskeletal normal gait and posture. no significant deformity or arthritic changes, no loss or range of motion, no clubbing. Psychiatric this patient is able to make decisions and demonstrates good insight into disease process. Alert and Oriented x 3. pleasant and cooperative. Notes Upon inspection of the patient's wounds she does have some dry skin around the edges of the wounds the Betadine has not helped a lot with that but overall it does not look as bad as it would initially appear which is good news. With that being said she is going require little bit of sharp debridement around the toe areas I want to do this very lightly and again this was all superficial nonviable stuff removed as I did not want to do anything too aggressive considering her arterial flow we will try to be careful in that regard. With that being said I do think that she is overall doing well with capillary refill as well and hoping that she has sufficient blood flow here to heal. With that being said it does sound as if she is going to proceed with doing the repeat arteriogram which again I think could improve her blood flow and subsequently could end up with seeing and hopefully a faster healing of what we are currently looking at. Postdebridement the wound did look better however. [place], [name] ([id number]) [id number] Page 5 of 12 Electronic Signature(s) Signed: 02/16/2023 1:34:31 PM By: Worthy Keeler PA-C Entered By: Worthy Keeler on 02/16/2023 13:34:30 -------------------------------------------------------------------------------- Physician Orders Details Patient Name: Date of Service: [name], [place] 02/16/2023 10:30 A M Medical Record Number: [id number] Patient Account Number: [id number] Date of Birth/Sex: Treating RN: 04-11-1951 (72 y.o. Drema Pry Primary Care Provider: PA Haig Prophet, [place] Other Clinician: Referring Provider: Treating Provider/Extender: Luiz Ochoa in Treatment: 0 Verbal / Phone Orders: No Diagnosis Coding Follow-up Appointments Return Appointment in 1 week. Bathing/ [employer] wounds with antibacterial soap and water. Anesthetic (Use 'Patient Medications' Section for Anesthetic Order Entry) Lidocaine applied to wound bed Wound Treatment Wound #1 - T Great oe Wound Laterality: Right, Lateral Cleanser: [place] Ancillary Kit - 15 Day Supply (DME) (Generic) 1 x Per Day/30 Days Discharge Instructions: Use supplies as instructed; Kit contains: (15) Saline Bullets; (15) 3x3 Gauze; 15 pr Gloves Cleanser: Soap and Water 1 x Per Day/30 Days Discharge Instructions: Gently cleanse wound with antibacterial soap, rinse and pat dry prior to dressing wounds Prim Dressing: Xeroform-HBD 2x2 (in/in) (DME) (Generic) 1 x Per Day/30 Days ary Discharge Instructions: Apply Xeroform-HBD 2x2 (in/in) as directed Secondary Dressing: Conforming Homero Fellers  Roll-Small 1 x Per Day/30 Days Discharge Instructions: Apply Conforming Stretch Guaze Bandage as directed Secured With: Medipore T - 52M Medipore H Soft Cloth Surgical T ape ape, 2x2 (in/yd) (DME) (Generic) 1 x Per Day/30 Days Wound #2 - Foot Wound Laterality: Right, Lateral Cleanser: Soap and Water 1 x Per Day/30 Days Discharge Instructions: Gently cleanse wound with antibacterial soap, rinse and pat dry prior to dressing wounds Prim Dressing: Xeroform-HBD 2x2 (in/in) (DME) (Generic) 1 x Per Day/30 Days ary Discharge Instructions: Apply Xeroform-HBD 2x2 (in/in) as directed Secondary Dressing: Conforming Guaze Roll-Small (DME) (Generic) 1 x Per Day/30 Days Discharge Instructions: Apply Conforming Stretch Guaze Bandage as directed Secured With: Medipore T - 52M Medipore H Soft Cloth Surgical T ape ape, 2x2 (in/yd) 1 x Per Day/30 Days Wound #3 - T Fifth oe Wound Laterality: Right, Lateral Cleanser: Soap and Water 1 x Per Day/30 Days Discharge Instructions: Gently cleanse  wound with antibacterial soap, rinse and pat dry prior to dressing wounds Prim Dressing: Xeroform-HBD 2x2 (in/in) (DME) (Generic) 1 x Per Day/30 Days ary Discharge Instructions: Apply Xeroform-HBD 2x2 (in/in) as directed Secondary Dressing: Conforming Guaze Roll-Small (DME) (Generic) 1 x Per Day/30 Days JENNIFR, SHEESLEY ([id number].pdf Page 6 of 12 Discharge Instructions: Apply Conforming Stretch Guaze Bandage as directed Secured With: Medipore T - 52M Medipore H Soft Cloth Surgical T ape ape, 2x2 (in/yd) 1 x Per Day/30 Days Wound #4 - T Fourth oe Wound Laterality: Right, Lateral Cleanser: Soap and Water 1 x Per Day/30 Days Discharge Instructions: Gently cleanse wound with antibacterial soap, rinse and pat dry prior to dressing wounds Prim Dressing: Xeroform-HBD 2x2 (in/in) (DME) (Generic) 1 x Per Day/30 Days ary Discharge Instructions: Apply Xeroform-HBD 2x2 (in/in) as directed Secondary Dressing: Conforming Guaze Roll-Small (DME) (Generic) 1 x Per Day/30 Days Discharge Instructions: Apply Conforming Stretch Guaze Bandage as directed Secured With: Medipore T - 52M Medipore H Soft Cloth Surgical T ape ape, 2x2 (in/yd) 1 x Per Day/30 Days Services and Therapies Ankle Brachial Index (ABI) Duplex scan Electronic Signature(s) Signed: 02/16/2023 2:12:07 PM By: Rosalio Loud MSN RN CNS WTA Signed: 02/16/2023 5:34:14 PM By: Worthy Keeler PA-C Entered By: Rosalio Loud on 02/16/2023 14:12:06 -------------------------------------------------------------------------------- Problem List Details Patient Name: Date of Service: [name], [place] 02/16/2023 10:30 A M Medical Record Number: [id number] Patient Account Number: [id number] Date of Birth/Sex: Treating RN: 1951/07/18 (72 y.o. 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 02/16/2023 No Yes L97.512 Non-pressure chronic ulcer of other part of right foot with fat layer exposed 02/16/2023 No Yes Z79.01 Long term (current) use of anticoagulants 02/16/2023 No Yes J44.9 Chronic obstructive pulmonary disease, unspecified 02/16/2023 No Yes I25.10 Atherosclerotic heart disease of native coronary artery without angina pectoris 02/16/2023 No Yes ANGELICA, Madiha ([id number]) [id number] Page 7 of 12 Inactive Problems Resolved Problems Electronic Signature(s) Signed: 02/16/2023 1:26:18 PM By: Worthy Keeler PA-C Entered By: Worthy Keeler on 02/16/2023 13:26:18 -------------------------------------------------------------------------------- Progress Note Details Patient Name: Date of Service: [name], [place] 02/16/2023 10:30 A M Medical Record Number: [id number] Patient Account Number: [id number] Date of Birth/Sex: Treating RN: 08-06-1951 (72 y.o. Drema Pry Primary Care Provider: PA Haig Prophet, [place] Other Clinician: Referring Provider: Treating Provider/Extender: Luiz Ochoa in Treatment: 0 Subjective Chief Complaint Information obtained from Patient Right foot ulcers History of Present Illness (  HPI) 02-16-2023 upon evaluation today patient presents for initial inspection here in our clinic concerning issues that she has been having with wounds over the right foot in the realm of several toes as well as the lateral portion of her foot fifth metatarsal location. Subsequently she unfortunately has somewhat poor arterial flow. I do have notes that I reviewed in epic and subsequently the patient did have an angiogram as well back in January. The testing following the angiogram revealed after stent placement on 01-09-2023 that she subsequently still had somewhat compromised arterial flow following with a right ABI of 0.70  with a TBI of 0.34 and on the left an ABI of 0.76 and the TBI could not be measured. 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 With that being said the follow-up as far as her arterial testing they did not do ABIs and TBI's but it showed that she was monophasic pretty much throughout and in general I think this still shows that there is a significant issue here and the vascular doctors seem to be in agreement as far as this is concerned they have recommended that she really needs to have repeat arterial angiogram with likely intervention to try to improve that monophasic flow. With that being said the patient is somewhat reluctant to do this though I think that she is leaning towards it even though she had a lot of bleeding issues in the groin area following the last time she had this done. In regard to her wound she is actually having quite a bit of an issue here with these wounds specifically in the realm of getting them to turn around and heal appropriately. She tells me that the areas are looking much better than they were but nonetheless she still is noticing that the Betadine seems to be keeping things a little bit dry but some areas are starting to drain a lot more unfortunately. She is on aspirin and Plavix following the stent placement. This is probably part of the bleeding issues that she had previously in the groin to be peripherally honest. She does have a history of COPD and coronary artery disease as well. Patient History Information obtained from Patient. Allergies penicillin (Severity: Moderate, Reaction: Rash), Vicodin (Severity: Moderate, Reaction: itching), oxycodone (Reaction: itching) Social History Former smoker, Alcohol Use - Never, Drug Use - No History, Caffeine Use - Never. Medical History Respiratory Patient has history of Asthma, Chronic Obstructive Pulmonary Disease (COPD) Cardiovascular Patient has history of Coronary Artery Disease Review of Systems  (ROS) Constitutional Symptoms (General Health) Denies complaints or symptoms of Fatigue, Fever, Chills, Marked Weight Change. Eyes Denies complaints or symptoms of Dry Eyes, Vision Changes, Glasses / Contacts. Ear/Nose/Mouth/Throat Denies complaints or symptoms of Difficult clearing ears, Sinusitis. Hematologic/Lymphatic Denies complaints or symptoms of Bleeding / Clotting Disorders, Human Immunodeficiency Virus. [place], [name] ([place]:[id number]) [id number] Page 8 of 12 Gastrointestinal Denies complaints or symptoms of Frequent diarrhea, Nausea, Vomiting. Endocrine Denies complaints or symptoms of Hepatitis, Thyroid disease, Polydypsia (Excessive Thirst). Genitourinary Denies complaints or symptoms of Kidney failure/ Dialysis, Incontinence/dribbling. Immunological Denies complaints or symptoms of Hives, Itching. Integumentary (Skin) Complains or has symptoms of Wounds. Denies complaints or symptoms of Bleeding or bruising tendency, Breakdown, Swelling. Musculoskeletal Denies complaints or symptoms of Muscle Pain, Muscle Weakness. Neurologic Denies complaints or symptoms of Numbness/parasthesias, Focal/Weakness. Psychiatric Denies complaints or symptoms of Anxiety, Claustrophobia. Objective Constitutional sitting or standing blood pressure is within target range for patient.. pulse regular and within target range for patient.Marland Kitchen respirations regular,  non-labored and within target range for patient.Marland Kitchen temperature within target range for patient.. Well-nourished and well-hydrated in no acute distress. Vitals Time Taken: 11:09 AM, Height: 62 in, Source: Stated, Weight: 162 lbs, BMI: 29.6, Temperature: 98.2 F, Pulse: 90 bpm, Respiratory Rate: 16 breaths/min, Blood Pressure: 121/78 mmHg. Eyes conjunctiva clear no eyelid edema noted. pupils equal round and reactive to light and accommodation. Ears, Nose, Mouth, and Throat no gross abnormality of ear auricles or  external auditory canals. normal hearing noted during conversation. mucus membranes moist. Respiratory normal breathing without difficulty. Cardiovascular Absent posterior tibial and dorsalis pedis pulses bilateral lower extremities. no clubbing, cyanosis, significant edema, Musculoskeletal normal gait and posture. no significant deformity or arthritic changes, no loss or range of motion, no clubbing. Psychiatric this patient is able to make decisions and demonstrates good insight into disease process. Alert and Oriented x 3. pleasant and cooperative. General Notes: Upon inspection of the patient's wounds she does have some dry skin around the edges of the wounds the Betadine has not helped a lot with that but overall it does not look as bad as it would initially appear which is good news. With that being said she is going require little bit of sharp debridement around the toe areas I want to do this very lightly and again this was all superficial nonviable stuff removed as I did not want to do anything too aggressive considering her arterial flow we will try to be careful in that regard. With that being said I do think that she is overall doing well with capillary refill as well and hoping that she has sufficient blood flow here to heal. With that being said it does sound as if she is going to proceed with doing the repeat arteriogram which again I think could improve her blood flow and subsequently could end up with seeing and hopefully a faster healing of what we are currently looking at. Postdebridement the wound did look better however. Integumentary (Hair, Skin) Wound #1 status is Open. Original cause of wound was Gradually Appeared. The date acquired was: 12/29/2022. The wound is located on the [employer] The wound measures 4.5cm length x 1.5cm width x 0.2cm depth; 5.301cm^2 area and 1.06cm^3 volume. There is Fat Layer (Subcutaneous Tissue) exposed. There is a medium amount of  serosanguineous drainage noted. Foul odor after cleansing was noted. There is medium (34-66%) red, pink granulation within the wound bed. There is a small (1-33%) amount of necrotic tissue within the wound bed including Eschar. Wound #2 status is Open. Original cause of wound was Pressure Injury. The date acquired was: 12/29/2022. The wound is located on the Right,Lateral Foot. The wound measures 2cm length x 1.5cm width x 0.2cm depth; 2.356cm^2 area and 0.471cm^3 volume. There is Fat Layer (Subcutaneous Tissue) exposed. There is a medium amount of serosanguineous drainage noted. There is small (1-33%) red, pink granulation within the wound bed. There is a medium (34-66%) amount of necrotic tissue within the wound bed including Adherent Slough. Wound #3 status is Open. Original cause of wound was Pressure Injury. The date acquired was: 12/29/2022. The wound is located on the Right,Lateral T Fifth. oe The wound measures 2.5cm length x 1.5cm width x 0.1cm depth; 2.945cm^2 area and 0.295cm^3 volume. There is Fat Layer (Subcutaneous Tissue) exposed. There is a medium amount of serosanguineous drainage noted. There is no granulation within the wound bed. There is a large (67-100%) amount of necrotic tissue within the wound bed including Eschar. Wound #4 status  is Open. Original cause of wound was Pressure Injury. The date acquired was: 12/29/2022. The wound is located on the Right,Lateral T Fourth. oe The wound measures 1cm length x 0.7cm width x 0.1cm depth; 0.55cm^2 area and 0.055cm^3 volume. There is Fat Layer (Subcutaneous Tissue) exposed. There is a medium amount of serosanguineous drainage noted. There is no granulation within the wound bed. There is a large (67-100%) amount of necrotic tissue within the wound bed including Eschar. 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 There was a Selective/Open Wound Non-Viable Tissue oe Debridement with a total area of 6.75 sq cm performed by Tommie Sams., PA-C. With the following instrument(s): Forceps, and Scissors to remove Non- Viable tissue/material. Material removed includes Eschar after achieving pain control using Lidocaine 4% Topical Solution. No specimens were taken. A time out was conducted at 12:09, prior to the start of the procedure. There was no bleeding. The procedure was tolerated well. Post Debridement Measurements: 4.5cm length x 1.5cm width x 0.3cm depth; 1.59cm^3 volume. Post debridement Stage noted as Category/Stage III. Character of Wound/Ulcer Post Debridement requires further debridement. Post procedure Diagnosis Wound #1: Same as Pre-Procedure Wound #3 Pre-procedure diagnosis of Wound #3 is a Neuropathic Ulcer-Non Diabetic located on the Right,Lateral T Fifth . There was a Selective/Open Wound Non- oe Viable Tissue Debridement with a total area of 3.75 sq cm performed by Tommie Sams., PA-C. With the following instrument(s): Forceps, and Scissors to remove Non-Viable tissue/material. Material removed includes Eschar after achieving pain control using Lidocaine 4% T opical Solution. No specimens were taken. A time out was conducted at 12:09, prior to the start of the procedure. There was no bleeding. The procedure was tolerated well. Post Debridement Measurements: 2.5cm length x 1.5cm width x 0.2cm depth; 0.589cm^3 volume. Character of Wound/Ulcer Post Debridement requires further debridement. Post procedure Diagnosis Wound #3: Same as Pre-Procedure Wound  #4 Pre-procedure diagnosis of Wound #4 is a Neuropathic Ulcer-Non Diabetic located on the Right,Lateral T Fourth . There was a Selective/Open Wound Non- oe Viable Tissue Debridement with a total area of 0.7 sq cm performed by Tommie Sams., PA-C. With the following instrument(s): Forceps, and Scissors to remove Non-Viable tissue/material. Material removed includes Eschar after achieving pain control using Lidocaine 4% T opical Solution. No specimens were taken. A time out was conducted at 12:09, prior to the start of the procedure. There was no bleeding. The procedure was tolerated well. Post Debridement Measurements: 1cm length x 0.7cm width x 0.2cm depth; 0.11cm^3 volume. Character of Wound/Ulcer Post Debridement requires further debridement. Post procedure Diagnosis Wound #4: Same as Pre-Procedure Plan Follow-up Appointments: Return Appointment in 1 week. Bathing/ Shower/ Hygiene: Wash wounds with antibacterial soap and water. Anesthetic (Use 'Patient Medications' Section for Anesthetic Order Entry): Lidocaine applied to wound bed WOUND #1: - T Great Wound Laterality: Right, Lateral oe Cleanser: [employer] Ancillary Kit - 15 Day Supply (DME) (Generic) 1 x Per Day/30 Days Discharge Instructions: Use supplies as instructed; Kit contains: (15) Saline Bullets; (15) 3x3 Gauze; 15 pr Gloves Cleanser: Soap and Water 1 x Per Day/30 Days Discharge Instructions: Gently cleanse wound with antibacterial soap, rinse  and pat dry prior to dressing wounds Prim Dressing: Xeroform-HBD 2x2 (in/in) (DME) (Generic) 1 x Per Day/30 Days ary Discharge Instructions: Apply Xeroform-HBD 2x2 (in/in) as directed Secondary Dressing: Conforming Guaze Roll-Small 1 x Per Day/30 Days Discharge Instructions: Apply Conforming Stretch Guaze Bandage as directed Secured With: Medipore T - 50M Medipore H Soft Cloth Surgical T ape ape, 2x2 (in/yd) (DME) (Generic) 1 x Per Day/30 Days WOUND #2: - Foot Wound Laterality: Right,  Lateral Cleanser: Soap and Water 1 x Per Day/30 Days Discharge Instructions: Gently cleanse wound with antibacterial soap, rinse and pat dry prior to dressing wounds Prim Dressing: Xeroform-HBD 2x2 (in/in) (DME) (Generic) 1 x Per Day/30 Days ary Discharge Instructions: Apply Xeroform-HBD 2x2 (in/in) as directed Secondary Dressing: Conforming Guaze Roll-Small (DME) (Generic) 1 x Per Day/30 Days Discharge Instructions: Apply Conforming Stretch Guaze Bandage as directed Secured With: Medipore T - 50M Medipore H Soft Cloth Surgical T ape ape, 2x2 (in/yd) 1 x Per Day/30 Days WOUND #3: - T Fifth Wound Laterality: Right, Lateral oe Cleanser: Soap and Water 1 x Per Day/30 Days Discharge Instructions: Gently cleanse wound with antibacterial soap, rinse and pat dry prior to dressing wounds Prim Dressing: Xeroform-HBD 2x2 (in/in) (DME) (Generic) 1 x Per Day/30 Days ary Discharge Instructions: Apply Xeroform-HBD 2x2 (in/in) as directed Secondary Dressing: Conforming Guaze Roll-Small (DME) (Generic) 1 x Per Day/30 Days Discharge Instructions: Apply Conforming Stretch Guaze Bandage as directed Secured With: Medipore T - 50M Medipore H Soft Cloth Surgical T ape ape, 2x2 (in/yd) 1 x Per Day/30 Days WOUND #4: - T Fourth Wound Laterality: Right, Lateral oe Cleanser: Soap and Water 1 x Per Day/30 Days Discharge Instructions: Gently cleanse wound with antibacterial soap, rinse and pat dry prior to dressing wounds Prim Dressing: Xeroform-HBD 2x2 (in/in) (DME) (Generic) 1 x Per Day/30 Days ary Discharge Instructions: Apply Xeroform-HBD 2x2 (in/in) as directed Secondary Dressing: Conforming Guaze Roll-Small (DME) (Generic) 1 x Per Day/30 Days Discharge Instructions: Apply Conforming Stretch Guaze Bandage as directed Secured With: Medipore T - 50M Medipore H Soft Cloth Surgical T ape ape, 2x2 (in/yd) 1 x Per Day/30 Days [name], [name] ([place]:[id number].pdf Page 10 of 12 1. Upon  evaluation patient's wound bed actually is showing signs of doing well at all locations all things considered. I would recommend we actually initiate treatment with Xeroform I think this is good to help [place] get some of the stuff off of here little bit better as it will soften up and we can more carefully debride even lightly and superficially as I did today. Subsequently we need to get this to where the wound beds are nice and clean and again I do not want any aggressive debridements due to the poor arterial flow but we definitely need to do something to get this cleaned away. 2. Again I do believe she is at risk for amputation and that is something that we need to be very cautious with that being said I do think also she has a chance of getting these wounds to heal especially if she has repeat angiogram with improved arterial flow I think her chances will be better. Nonetheless as it stands right now we are still going to do what we can to try to get things moving in the right direction. 3. I am also going to suggest that we have the patient continue her rather initiate cleaning and monitoring of this daily as far as the dressings are concerned. I think the Xeroform needs to be changed daily  and that is okay because the patient prefers to do this daily as it stands anyway. At some point we may switch over to [facility] there was some indication of several areas with some hypergranulation but again right now I do not think that is the primary goal any needs. We will see patient back for reevaluation in 1 week here in the clinic. If anything worsens or changes patient will contact our office for additional recommendations. Electronic Signature(s) Signed: 02/16/2023 1:36:12 PM By: Worthy Keeler PA-C Entered By: Worthy Keeler on 02/16/2023 13:36:11 -------------------------------------------------------------------------------- ROS/PFSH Details Patient Name: Date of Service: [name], [place]  02/16/2023 10:30 A M Medical Record Number: [id number] Patient Account Number: [id number] Date of Birth/Sex: Treating RN: [birth year] (72 y.o. Drema Pry Primary Care Provider: PA Haig Prophet, [place] Other Clinician: Referring Provider: Treating Provider/Extender: Luiz Ochoa in Treatment: 0 Information Obtained From Patient Constitutional Symptoms (General Health) Complaints and Symptoms: Negative for: Fatigue; Fever; Chills; Marked Weight Change Eyes Complaints and Symptoms: Negative for: Dry Eyes; Vision Changes; Glasses / Contacts Ear/Nose/Mouth/Throat Complaints and Symptoms: Negative for: Difficult clearing ears; Sinusitis Hematologic/Lymphatic Complaints and Symptoms: Negative for: Bleeding / Clotting Disorders; Human Immunodeficiency Virus Gastrointestinal Complaints and Symptoms: Negative for: Frequent diarrhea; Nausea; Vomiting Endocrine Complaints and Symptoms: Negative for: Hepatitis; Thyroid disease; Polydypsia (Excessive Thirst) ARELLANO, Maddelynn ([place]:[id number].pdf Page 11 of 12 Genitourinary Complaints and Symptoms: Negative for: Kidney failure/ Dialysis; Incontinence/dribbling Immunological Complaints and Symptoms: Negative for: Hives; Itching Integumentary (Skin) Complaints and Symptoms: Positive for: Wounds Negative for: Bleeding or bruising tendency; Breakdown; Swelling Musculoskeletal Complaints and Symptoms: Negative for: Muscle Pain; Muscle Weakness Neurologic Complaints and Symptoms: Negative for: Numbness/parasthesias; Focal/Weakness Psychiatric Complaints and Symptoms: Negative for: Anxiety; Claustrophobia Respiratory Medical History: Positive for: Asthma; Chronic Obstructive Pulmonary Disease (COPD) Cardiovascular Medical History: Positive for: Coronary Artery Disease Oncologic Immunizations Pneumococcal Vaccine: Received Pneumococcal Vaccination: Yes Received Pneumococcal Vaccination On or  After 60th Birthday: Yes Implantable Devices None Family and Social History Former smoker; Alcohol Use: Never; Drug Use: No History; Caffeine Use: Never Electronic Signature(s) Signed: 02/16/2023 4:58:05 PM By: Rosalio Loud MSN RN CNS WTA Signed: 02/16/2023 5:34:14 PM By: Worthy Keeler PA-C Entered By: Rosalio Loud on 02/16/2023 11:48:29 SuperBill Details -------------------------------------------------------------------------------- [name] ([place]:[id number].pdf Page 12 of 12 Patient Name: Date of Service: RORI, MEINDERS 02/16/2023 Medical Record Number: [id number] Patient Account Number: [id number] Date of Birth/Sex: Treating RN: Oct 22, 1951 (72 y.o. Drema Pry Primary Care Provider: PA Haig Prophet, [place] Other Clinician: Referring Provider: Treating Provider/Extender: Luiz Ochoa in Treatment: 0 Diagnosis Coding ICD-10 Codes Code Description G60.3 Idiopathic progressive neuropathy L97.512 Non-pressure chronic ulcer of other part of right foot with fat layer exposed Z79.01 Long term (current) use of anticoagulants J44.9 Chronic obstructive pulmonary disease, unspecified I25.10 Atherosclerotic heart disease of native coronary artery without angina pectoris Facility Procedures CPT4 Code Description Modifier Quantity [id number] - WOUND CARE VISIT-LEV 5 EST PT 1 [id number] - DEBRIDE WOUND 1ST 20 SQ CM OR < 1 ICD-10 Diagnosis Description L97.512 Non-pressure chronic ulcer of other part of right foot with fat layer exposed Physician Procedures Quantity CPT4 Code Description Modifier [id number] WC PHYS LEVEL 3 NEW PT 25 1 ICD-10 Diagnosis Description G60.3 Idiopathic progressive neuropathy L97.512 Non-pressure chronic ulcer of other part of right foot with fat layer exposed Z79.01 Long term (current) use of anticoagulants J44.9 Chronic obstructive pulmonary disease, unspecified [id number] - WC PHYS DEBR WO ANESTH 20 SQ  CM 1 ICD-10 Diagnosis  Description L97.512 Non-pressure chronic ulcer of other part of right foot with fat layer exposed Electronic Signature(s) Signed: 02/16/2023 6:03:23 PM By: Gretta Cool, BSN, RN, CWS, Kim RN, BSN Previous Signature: 02/16/2023 1:36:30 PM Version By: Worthy Keeler PA-C Entered By: Gretta Cool, BSN, RN, CWS, Kim on 02/16/2023 18:03:23

## 2023-02-24 ENCOUNTER — Encounter: Payer: Medicare Other | Attending: Internal Medicine | Admitting: Internal Medicine

## 2023-02-24 DIAGNOSIS — I2510 Atherosclerotic heart disease of native coronary artery without angina pectoris: Secondary | ICD-10-CM | POA: Diagnosis not present

## 2023-02-24 DIAGNOSIS — L97512 Non-pressure chronic ulcer of other part of right foot with fat layer exposed: Secondary | ICD-10-CM | POA: Diagnosis not present

## 2023-02-24 DIAGNOSIS — Z7901 Long term (current) use of anticoagulants: Secondary | ICD-10-CM | POA: Diagnosis not present

## 2023-02-24 DIAGNOSIS — Z7902 Long term (current) use of antithrombotics/antiplatelets: Secondary | ICD-10-CM | POA: Insufficient documentation

## 2023-02-24 DIAGNOSIS — J449 Chronic obstructive pulmonary disease, unspecified: Secondary | ICD-10-CM | POA: Insufficient documentation

## 2023-02-24 DIAGNOSIS — G603 Idiopathic progressive neuropathy: Secondary | ICD-10-CM | POA: Diagnosis present

## 2023-02-24 DIAGNOSIS — Z7982 Long term (current) use of aspirin: Secondary | ICD-10-CM | POA: Diagnosis not present

## 2023-02-26 ENCOUNTER — Ambulatory Visit: Payer: Medicare Other | Admitting: Podiatry

## 2023-02-27 NOTE — Progress Notes (Signed)
[name], [name] ([id number]) [id number] Page 1 of 12 Visit Report for 02/24/2023 Arrival Information Details Patient Name: Date of Service: [name], [name] 02/24/2023 12:30 PM Medical Record Number: [id number] Patient Account Number: [id number] Date of Birth/Sex: Treating RN: 06/13/1951 (72 y.o. Drema Pry Primary Care Barrett Holthaus: PA Haig Prophet, NO Other Clinician: Referring Hila Bolding: Treating Johnrobert Foti/Extender: RO BSO Delane Ginger, MICHA EL Ayesha Rumpf in Treatment: 1 Visit Information History Since Last Visit Added or deleted any medications: No Patient Arrived: Wheel Chair Has Dressing in Place as Prescribed: Yes Arrival Time: 12:42 Pain Present Now: No Accompanied By: daughter Transfer Assistance: None Patient Identification Verified: Yes Secondary Verification Process Completed: Yes Patient Requires Transmission-Based Precautions: No Patient Has Alerts: Yes Patient Alerts: Patient on Blood Thinner PLAVIX ASA 81 mg R ABI 0.70 TBI 0.34 L ABI 0.76 TBI 0.00 Electronic Signature(s) Signed: 02/25/2023 4:47:39 PM By: Rosalio Loud MSN RN CNS WTA Entered By: Rosalio Loud on 02/24/2023 12:42:51 -------------------------------------------------------------------------------- Clinic Level of Care Assessment Details Patient Name: Date of Service: [name], [name] 02/24/2023 12:30 PM Medical Record Number: [id number] Patient Account Number: [id number] Date of Birth/Sex: Treating RN: 23-Nov-1951 (72 y.o. Drema Pry Primary Care Jaydyn Menon: PA Haig Prophet, NO Other Clinician: Referring Kendre Jacinto: Treating Cederic Mozley/Extender: RO BSO N, MICHA EL Ayesha Rumpf in Treatment: 1 Clinic Level of Care Assessment Items TOOL 1 Quantity Score '[]'$  - 0 Use when EandM and Procedure is performed on INITIAL visit ASSESSMENTS - Nursing Assessment / Reassessment '[]'$  - 0 General Physical Exam (combine w/ comprehensive assessment (listed just below) when performed on new pt. evals) '[]'$   - 0 Comprehensive Assessment (HX, ROS, Risk Assessments, Wounds Hx, etc.) [name], [name] ([id number]) [id number] Page 2 of 12 ASSESSMENTS - Wound and Skin Assessment / Reassessment '[]'$  - 0 Dermatologic / Skin Assessment (not related to wound area) ASSESSMENTS - Ostomy and/or Continence Assessment and Care '[]'$  - 0 Incontinence Assessment and Management '[]'$  - 0 Ostomy Care Assessment and Management (repouching, etc.) PROCESS - Coordination of Care '[]'$  - 0 Simple Patient / Family Education for ongoing care '[]'$  - 0 Complex (extensive) Patient / Family Education for ongoing care '[]'$  - 0 Staff obtains Programmer, systems, Records, T Results / Process Orders est '[]'$  - 0 Staff telephones HHA, Nursing Homes / Clarify orders / etc '[]'$  - 0 Routine Transfer to another Facility (non-emergent condition) '[]'$  - 0 Routine Hospital Admission (non-emergent condition) '[]'$  - 0 New Admissions / Biomedical engineer / Ordering NPWT Apligraf, etc. , '[]'$  - 0 Emergency Hospital Admission (emergent condition) PROCESS - Special Needs '[]'$  - 0 Pediatric / Minor Patient Management '[]'$  - 0 Isolation Patient Management '[]'$  - 0 Hearing / Language / Visual special needs '[]'$  - 0 Assessment of Community assistance (transportation, D/C planning, etc.) '[]'$  - 0 Additional assistance / Altered mentation '[]'$  - 0 Support Surface(s) Assessment (bed, cushion, seat, etc.) INTERVENTIONS - Miscellaneous '[]'$  - 0 External ear exam '[]'$  - 0 Patient Transfer (multiple staff / Civil Service fast streamer / Similar devices) '[]'$  - 0 Simple Staple / Suture removal (25 or less) '[]'$  - 0 Complex Staple / Suture removal (26 or more) '[]'$  - 0 Hypo/Hyperglycemic Management (do not check if billed separately) '[]'$  - 0 Ankle / Brachial Index (ABI) - do not check if billed separately Has the patient been seen at the hospital within the last three years: Yes Total Score: 0 Level Of Care: ____ Electronic Signature(s) Signed: 02/25/2023 4:47:39 PM  By: Rosalio Loud MSN RN CNS WTA Entered By: Rosalio Loud on 02/24/2023  13:25:35 -------------------------------------------------------------------------------- Encounter Discharge Information Details Patient Name: Date of Service: [name], [name] 02/24/2023 12:30 PM Medical Record Number: [id number] Patient Account Number: [id number] Date of Birth/Sex: Treating RN: February 23, 1951 (72 y.o. Drema Pry Primary Care Anjelita Sheahan: PA Haig Prophet, NO Other Clinician: Referring Dushaun Okey: Treating Rhonda Vangieson/Extender: Eldridge Dace, MICHA EL Ayesha Rumpf in Treatment: [place], [place] ([id number]) [id number] Page 3 of 12 Encounter Discharge Information Items Post Procedure Vitals Discharge Condition: Stable Temperature (F): 98.2 Ambulatory Status: Wheelchair Pulse (bpm): 75 Discharge Destination: Home Respiratory Rate (breaths/min): 16 Transportation: Private Auto Blood Pressure (mmHg): 128/96 Accompanied By: daughter Schedule Follow-up Appointment: Yes Clinical Summary of Care: Electronic Signature(s) Signed: 02/25/2023 4:47:39 PM By: Rosalio Loud MSN RN CNS WTA Entered By: Rosalio Loud on 02/24/2023 13:27:05 -------------------------------------------------------------------------------- Lower Extremity Assessment Details Patient Name: Date of Service: [name], [name] 02/24/2023 12:30 PM Medical Record Number: [id number] Patient Account Number: [id number] Date of Birth/Sex: Treating RN: 03-22-1951 (72 y.o. Drema Pry Primary Care Zidane Renner: PA Haig Prophet, NO Other Clinician: Referring Terrika Zuver: Treating Miachel Nardelli/Extender: RO BSO Delane Ginger, MICHA EL Ayesha Rumpf in Treatment: 1 Edema Assessment Assessed: [Left: No] [Right: No] [Left: Edema] [Right: :] Calf Left: Right: Point of Measurement: From Medial Instep 31.5 cm Ankle Left: Right: Point of Measurement: From Medial Instep 25 cm Vascular Assessment Pulses: Dorsalis Pedis Palpable: [Right:Yes] Electronic  Signature(s) Signed: 02/25/2023 4:47:39 PM By: Rosalio Loud MSN RN CNS WTA Entered By: Rosalio Loud on 02/24/2023 13:09:18 [place], [name] ([id number].pdf Page 4 of 12 -------------------------------------------------------------------------------- Multi Wound Chart Details Patient Name: Date of Service: [name], [name] 02/24/2023 12:30 PM Medical Record Number: [id number] Patient Account Number: [id number] Date of Birth/Sex: Treating RN: 1951-06-07 (72 y.o. 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 Etiology: Asthma, Chronic Obstructive Asthma, Chronic Obstructive Asthma, Chronic Obstructive Comorbid History: Pulmonary Disease (COPD), Coronary Pulmonary Disease (COPD), Coronary Pulmonary Disease (COPD), Coronary Artery Disease Artery Disease Artery Disease 12/29/2022 12/29/2022 12/29/2022 Date Acquired: '1 1 1 '$ Weeks of Treatment: Open Open Open Wound Status: No No No Wound Recurrence: Yes Yes Yes Pending A mputation on Presentation: 4x1.8x0.2 1.5x1.7x0.2 0.6x0.6x0.1 Measurements L x W x D (cm) 5.655 2.003 0.283 A (cm) : rea 1.131 0.401 0.028 Volume (cm) : -6.70% 15.00% 90.40% % Reduction in A rea: -6.70% 14.90% 90.50% % Reduction in Volume: Full Thickness Without Exposed Full Thickness Without Exposed Unclassifiable Classification: Support Structures Support  Structures None Present Medium Medium Exudate A mount: N/A Serosanguineous Serosanguineous Exudate Type: N/A red, brown red, brown Exudate Color: Yes No No Foul Odor A Cleansing: fter No N/A N/A Odor Anticipated Due to Product Use: Medium (34-66%) Small (1-33%) None Present (0%) Granulation A mount: Red, Pink Red, Pink N/A Granulation Quality: Small (1-33%) Medium (34-66%) Large (67-100%) Necrotic Amount: Eschar Adherent Slough Eschar Necrotic Tissue: Fat Layer (Subcutaneous Tissue): Yes Fat Layer (Subcutaneous Tissue): Yes Fat Layer (Subcutaneous Tissue): Yes Exposed Structures: Fascia: No Fascia: No Fascia: No Tendon: No Tendon: No Tendon: No Muscle: No Muscle: No Muscle: No Joint: No Joint: No Joint: No Bone: No Bone: No Bone: No N/A  None None Epithelialization: Wound Number: 4 N/A N/A Photos: N/A N/A [name] ([id number]) [id number] Page 5 of 12 Right, Lateral T Fourth oe N/A N/A Wound Location: Pressure Injury N/A N/A Wounding Event: Neuropathic Ulcer-Non Diabetic N/A N/A Primary Etiology: Asthma, Chronic Obstructive N/A N/A Comorbid History: Pulmonary Disease (COPD), Coronary Artery Disease 12/29/2022 N/A N/A Date Acquired: 1 N/A N/A Weeks of Treatment: Open N/A N/A Wound Status: No N/A N/A Wound Recurrence: Yes N/A N/A Pending A mputation on Presentation: 0.5x0.5x0.1 N/A N/A Measurements L x W x D (cm) 0.196 N/A N/A A (cm) : rea 0.02 N/A N/A Volume (cm) : 64.40% N/A N/A % Reduction in A rea: 63.60% N/A N/A % Reduction in Volume: Unclassifiable N/A N/A Classification: Medium N/A N/A Exudate A mount: Serosanguineous N/A N/A Exudate Type: red, brown N/A N/A Exudate Color: No N/A N/A Foul Odor A Cleansing: fter N/A N/A N/A Odor A nticipated Due to Product Use: None Present (0%) N/A N/A Granulation A mount: N/A N/A N/A Granulation Quality: Large (67-100%) N/A N/A Necrotic A mount: Eschar  N/A N/A Necrotic Tissue: Fat Layer (Subcutaneous Tissue): Yes N/A N/A Exposed Structures: Fascia: No Tendon: No Muscle: No Joint: No Bone: No None N/A N/A Epithelialization: Treatment Notes Electronic Signature(s) Signed: 02/25/2023 4:47:39 PM By: Rosalio Loud MSN RN CNS WTA Entered By: Rosalio Loud on 02/24/2023 13:09:24 -------------------------------------------------------------------------------- Pain Assessment Details Patient Name: Date of Service: [name], [name] 02/24/2023 12:30 PM Medical Record Number: [id number] Patient Account Number: [id number] Date of Birth/Sex: Treating RN: [birth year] (72 y.o. Drema Pry Primary Care Mohab Ashby: PA Haig Prophet, NO Other Clinician: Referring Ligia Duguay: Treating Sharetha Newson/Extender: RO BSO N, MICHA EL Ayesha Rumpf in Treatment: 1 Active Problems Location of Pain Severity and Description of Pain Patient Has Paino No Site Locations [place], [place] ([id number]) [id number] Page 6 of 12 Pain Management and Medication Current Pain Management: Electronic Signature(s) Signed: 02/25/2023 4:47:39 PM By: Rosalio Loud MSN RN CNS WTA Entered By: Rosalio Loud on 02/24/2023 12:45:35 -------------------------------------------------------------------------------- Patient/Caregiver Education Details Patient Name: Date of Service: [name], [name] 3/5/2024andnbsp12:30 PM Medical Record Number: [id number] Patient Account Number: [id number] Date of Birth/Gender: Treating RN: [birth year] (72 y.o. Drema Pry Primary Care Physician: PA Haig Prophet, NO Other Clinician: Referring Physician: Treating Physician/Extender: RO BSO Delane Ginger, MICHA EL Ayesha Rumpf in Treatment: 1 Education Assessment Education Provided To: Patient Education Topics Provided Wound Debridement: Handouts: Wound Debridement Methods: Explain/Verbal Responses: State content correctly Electronic Signature(s) Signed: 02/25/2023 4:47:39 PM By: Rosalio Loud MSN RN CNS WTA Entered By: Rosalio Loud on 02/24/2023 13:25:57 [place], [name] ([id number] Page 7 of 12 -------------------------------------------------------------------------------- Wound Assessment Details Patient Name: Date of Service: [name], [name] 02/24/2023 12:30 PM Medical Record Number: [id number] Patient Account Number: [id number] Date of Birth/Sex: Treating RN: July 16, 1951 (72 y.o. Drema Pry Primary Care Abednego Yeates: PA TIENT, NO Other Clinician: Referring Cyanna Neace: Treating Merl Guardino/Extender: RO BSO N, MICHA EL Ayesha Rumpf in Treatment: 1 Wound Status Wound Number: 1 Primary Neuropathic Ulcer-Non Diabetic Etiology: Wound Location: Right, Lateral T Great oe Wound Open Wounding Event: Gradually Appeared Status: Date Acquired: 12/29/2022 Comorbid Asthma, Chronic Obstructive Pulmonary Disease (COPD), Weeks Of Treatment: 1 History: Coronary Artery Disease Clustered Wound: No Pending Amputation On Presentation Photos Wound Measurements Length: (cm) 4 Width: (cm) 1.8 Depth: (cm) 0.2 Area: (cm) 5.655 Volume: (cm) 1.131 % Reduction in Area: -6.7% % Reduction in Volume: -6.7% Wound Description Classification: Full Thickness Without Exposed Suppor Exudate Amount: None Present t Structures Foul Odor After Cleansing: Yes  Due to Product Use: No Slough/Fibrino Yes Wound Bed Granulation Amount: Medium (34-66%) Exposed Structure Granulation Quality: Red, Pink Fascia Exposed: No Necrotic Amount: Small (1-33%) Fat Layer (Subcutaneous Tissue) Exposed: Yes Necrotic Quality: Eschar Tendon Exposed: No Muscle Exposed: No Joint Exposed: No Bone Exposed: No Treatment Notes Wound #1 (Toe Great) Wound Laterality: Right, Lateral Cleanser [place] Ancillary Kit - 15 Day Supply Discharge Instruction: Use supplies as instructed; Kit contains: (15) Saline Bullets; (15) 3x3 Gauze; 15 pr Gloves Yeh, Jemeka ([id number])  [id number].pdf Page 8 of 12 Soap and Water Discharge Instruction: Gently cleanse wound with antibacterial soap, rinse and pat dry prior to dressing wounds Peri-Wound Care Topical Primary Dressing Xeroform-HBD 2x2 (in/in) Discharge Instruction: Apply Xeroform-HBD 2x2 (in/in) as directed Secondary Dressing Conforming [place] Roll-Small Discharge Instruction: [place] as directed Secured With [place] [facility] T ape ape, 2x2 (in/yd) Compression Wrap Compression Stockings Add-Ons Electronic Signature(s) Signed: 02/25/2023 4:47:39 PM By: Rosalio Loud MSN RN CNS WTA Entered By: Rosalio Loud on 02/24/2023 12:59:55 -------------------------------------------------------------------------------- Wound Assessment Details Patient Name: Date of Service: [name], [name] 02/24/2023 12:30 PM Medical Record Number: [id number] Patient Account Number: [id number] Date of Birth/Sex: Treating RN: 01/11/1951 (72 y.o. Drema Pry Primary Care Gay Rape: PA TIENT, NO Other Clinician: Referring Anndee Connett: Treating Alin Chavira/Extender: RO BSO N, MICHA EL Ayesha Rumpf in Treatment: 1 Wound Status Wound Number: 2 Primary Neuropathic Ulcer-Non Diabetic Etiology: Wound Location: Right, Lateral Foot Wound Open Wounding Event: Pressure Injury Status: Date Acquired: 12/29/2022 Comorbid Asthma, Chronic Obstructive Pulmonary Disease (COPD), Weeks Of Treatment: 1 History: Coronary Artery Disease Clustered Wound: No Pending Amputation On Presentation Photos Wound Measurements Length: (cm) 1.5 Width: (cm) 1.7 [place], [place] ([id number]) Depth: (cm) 0.2 Area: (cm) 2.003 Volume: (cm) 0.401 % Reduction in Area: 15% % Reduction in Volume: 14.9% [id number] Page 9 of 12 Epithelialization: None Wound Description Classification: Full Thickness Without Exposed Suppor Exudate Amount:  Medium Exudate Type: Serosanguineous Exudate Color: red, brown t Structures Foul Odor After Cleansing: No Slough/Fibrino Yes Wound Bed Granulation Amount: Small (1-33%) Exposed Structure Granulation Quality: Red, Pink Fascia Exposed: No Necrotic Amount: Medium (34-66%) Fat Layer (Subcutaneous Tissue) Exposed: Yes Necrotic Quality: Adherent Slough Tendon Exposed: No Muscle Exposed: No Joint Exposed: No Bone Exposed: No Treatment Notes Wound #2 (Foot) Wound Laterality: Right, Lateral Cleanser Soap and Water Discharge Instruction: Gently cleanse wound with antibacterial soap, rinse and pat dry prior to dressing wounds Peri-Wound Care Topical Primary Dressing Xeroform-HBD 2x2 (in/in) Discharge Instruction: Apply Xeroform-HBD 2x2 (in/in) as directed Secondary Dressing Conforming [place] Roll-Small Discharge Instruction: Apply Conforming Stretch [place] Bandage as directed Secured With Medipore T - 39M Medipore H Soft Cloth Surgical T ape ape, 2x2 (in/yd) Compression Wrap Compression Stockings Add-Ons Electronic Signature(s) Signed: 02/25/2023 4:47:39 PM By: Rosalio Loud MSN RN CNS WTA Entered By: Rosalio Loud on 02/24/2023 13:00:41 -------------------------------------------------------------------------------- Wound Assessment Details Patient Name: Date of Service: [name], [name] 02/24/2023 12:30 PM Medical Record Number: [id number] Patient Account Number: [id number] Date of Birth/Sex: Treating RN: 13-Oct-1951 (72 y.o. Drema Pry Primary Care Angelito Hopping: PA Haig Prophet, NO Other Clinician: Referring Anelly Samarin: Treating Nichalas Coin/Extender: RO BSO Delane Ginger, MICHA EL Ayesha Rumpf in Treatment: 1 Wound Status [name], [name] ([id number]) [id number] Page 10 of 12 Wound Number: 3 Primary Neuropathic Ulcer-Non Diabetic Etiology: Wound Location: Right, Lateral T Fifth oe Wound Open Wounding Event: Pressure Injury Status: Date Acquired: 12/29/2022 Comorbid  Asthma, Chronic Obstructive Pulmonary Disease (COPD), Weeks Of Treatment: 1 History: Coronary  Artery Disease Clustered Wound: No Pending Amputation On Presentation Photos Wound Measurements Length: (cm) 0.6 Width: (cm) 0.6 Depth: (cm) 0.1 Area: (cm) 0.283 Volume: (cm) 0.028 % Reduction in Area: 90.4% % Reduction in Volume: 90.5% Epithelialization: None Wound Description Classification: Unclassifiable Exudate Amount: Medium Exudate Type: Serosanguineous Exudate Color: red, brown Foul Odor After Cleansing: No Slough/Fibrino No Wound Bed Granulation Amount: None Present (0%) Exposed Structure Necrotic Amount: Large (67-100%) Fascia Exposed: No Necrotic Quality: Eschar Fat Layer (Subcutaneous Tissue) Exposed: Yes Tendon Exposed: No Muscle Exposed: No Joint Exposed: No Bone Exposed: No Treatment Notes Wound #3 (Toe Fifth) Wound Laterality: Right, Lateral Cleanser Soap and Water Discharge Instruction: Gently cleanse wound with antibacterial soap, rinse and pat dry prior to dressing wounds Peri-Wound Care Topical Primary Dressing Xeroform-HBD 2x2 (in/in) Discharge Instruction: Apply Xeroform-HBD 2x2 (in/in) as directed Secondary Dressing Conforming [place] Roll-Small Discharge Instruction: Apply Conforming Stretch [place] Bandage as directed Secured With [place] H Soft Cloth Surgical T ape ape, 2x2 (in/yd) Compression Wrap Compression Stockings Add-Ons Electronic Signature(s) Signed: 02/25/2023 4:47:39 PM By: Rosalio Loud MSN RN CNS Adalberto Cole, [place] ([id number]) [id number].pdf Page 11 of 12 Entered By: Rosalio Loud on 02/24/2023 13:01:09 -------------------------------------------------------------------------------- Wound Assessment Details Patient Name: Date of Service: [name], [name] 02/24/2023 12:30 PM Medical Record Number: [id number] Patient Account Number: [id number] Date of Birth/Sex: Treating RN: 10/17/1951 (72 y.o. Drema Pry Primary Care Rayanne Padmanabhan: PA TIENT, NO Other Clinician: Referring Roque Schill: Treating Macarthur Lorusso/Extender: RO BSO N, MICHA EL Ayesha Rumpf in Treatment: 1 Wound Status Wound Number: 4 Primary Neuropathic Ulcer-Non Diabetic Etiology: Wound Location: Right, Lateral T Fourth oe Wound Open Wounding Event: Pressure Injury Status: Date Acquired: 12/29/2022 Comorbid Asthma, Chronic Obstructive Pulmonary Disease (COPD), Weeks Of Treatment: 1 History: Coronary Artery Disease Clustered Wound: No Pending Amputation On Presentation Photos Wound Measurements Length: (cm) 0.5 Width: (cm) 0.5 Depth: (cm) 0.1 Area: (cm) 0.196 Volume: (cm) 0.02 % Reduction in Area: 64.4% % Reduction in Volume: 63.6% Epithelialization: None Wound Description Classification: Unclassifiable Exudate Amount: Medium Exudate Type: Serosanguineous Exudate Color: red, brown Foul Odor After Cleansing: No Slough/Fibrino No Wound Bed Granulation Amount: None Present (0%) Exposed Structure Necrotic Amount: Large (67-100%) Fascia Exposed: No Necrotic Quality: Eschar Fat Layer (Subcutaneous Tissue) Exposed: Yes Tendon Exposed: No Muscle Exposed: No Joint Exposed: No Bone Exposed: No Treatment Notes Wound #4 (Toe Fourth) Wound Laterality: Right, Lateral [place], [place] ([id number] Page 12 of 12 Cleanser Soap and Water Discharge Instruction: Gently cleanse wound with antibacterial soap, rinse and pat dry prior to dressing wounds Peri-Wound Care Topical Primary Dressing Xeroform-HBD 2x2 (in/in) Discharge Instruction: Apply Xeroform-HBD 2x2 (in/in) as directed Secondary Dressing Conforming [place] Roll-Small Discharge Instruction: [place] as directed Secured With [place] Soft Cloth Surgical T ape ape, 2x2 (in/yd) Compression Wrap Compression Stockings Add-Ons Electronic Signature(s) Signed: 02/25/2023  4:47:39 PM By: Rosalio Loud MSN RN CNS WTA Entered By: Rosalio Loud on 02/24/2023 13:01:34 -------------------------------------------------------------------------------- Vitals Details Patient Name: Date of Service: [name], [name] 02/24/2023 12:30 PM Medical Record Number: [id number] Patient Account Number: [id number] Date of Birth/Sex: Treating RN: 17-Nov-1951 (72 y.o. Drema Pry Primary Care Spencer Cardinal: PA TIENT, NO Other Clinician: Referring Tishie Altmann: Treating Jadea Shiffer/Extender: RO BSO N, MICHA EL Ayesha Rumpf in Treatment: 1 Vital Signs Time Taken: 12:43 Temperature (F): 98.2 Height (in): 62 Pulse (bpm): 75 Weight (lbs): 162 Respiratory Rate (breaths/min): 16 Body Mass Index (BMI): 29.6 Blood Pressure (mmHg): 128/96 Reference  Range: 80 - 120 mg / dl Electronic Signature(s) Signed: 02/25/2023 4:47:39 PM By: Rosalio Loud MSN RN CNS WTA Entered By: Rosalio Loud on 02/24/2023 12:45:27

## 2023-02-27 NOTE — Progress Notes (Signed)
[place], [name] ([id number]) [id number] Page 1 of 7 Visit Report for 02/24/2023 Debridement Details Patient Name: Date of Service: [name], [place] 02/24/2023 12:30 PM Medical Record Number: [id number] Patient Account Number: [id number] Date of Birth/Sex: Treating RN: 05-20-1951 (72 y.o. [name] Primary Care Provider: PA TIENT, [name] Other Clinician: Referring Provider: Treating Provider/Extender: RO BSO N, MICHA EL Ayesha Rumpf in Treatment: 1 Debridement Performed for Assessment: Wound #2 Right,Lateral Foot Performed By: Physician Ricard Dillon, MD Debridement Type: Debridement Level of Consciousness (Pre-procedure): Awake and Alert Pre-procedure Verification/Time Out Yes - 13:23 Taken: Start Time: 13:23 Pain Control: Lidocaine 4% T opical Solution T Area Debrided (L x W): otal 1.5 (cm) x 1.7 (cm) = 2.55 (cm) Tissue and other material debrided: Viable, Non-Viable, Slough, Subcutaneous, Slough Level: Skin/Subcutaneous Tissue Debridement Description: Excisional Instrument: Curette Bleeding: Minimum Hemostasis Achieved: Pressure Response to Treatment: Procedure was tolerated well Level of Consciousness (Post- Awake and Alert procedure): Post Debridement Measurements of Total Wound Length: (cm) 1.5 Width: (cm) 1.7 Depth: (cm) 0.3 Volume: (cm) 0.601 Character of Wound/Ulcer Post Debridement: Stable Post Procedure Diagnosis Same as Pre-procedure Electronic Signature(s) Signed: 02/24/2023 4:03:34 PM By: Linton Ham MD Signed: 02/25/2023 4:47:39 PM By: Rosalio Loud MSN RN CNS WTA Entered By: Rosalio Loud on 02/24/2023 13:24:57 -------------------------------------------------------------------------------- HPI Details Patient Name: Date of Service: [name], [place] 02/24/2023 12:30 PM Medical Record Number: [id number] Patient Account Number: [id number] [name] ([id number]) [id number] Page 2 of 7 Date of  Birth/Sex: Treating RN: 1951-09-18 (72 y.o. [name] Primary Care Provider: Other Clinician: PA [place], [name] Referring Provider: Treating Provider/Extender: Eldridge Dace, MICHA EL Ayesha Rumpf in Treatment: 1 History of Present Illness HPI Description: 02-16-2023 upon evaluation today patient presents for initial inspection here in our clinic concerning issues that she has been having with wounds over the right foot in the realm of several toes as well as the lateral portion of her foot fifth metatarsal location. Subsequently she unfortunately has somewhat poor arterial flow. I do have notes that I reviewed in epic and subsequently the patient did have an angiogram as well back in January. The testing following the angiogram revealed after stent placement on 01-09-2023 that she subsequently still had somewhat compromised arterial flow following with a right ABI of 0.70 with a TBI of 0.34 and on the left an ABI of 0.76 and the TBI could not be measured. 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 With that being said the follow-up as far as her arterial testing they did not do ABIs and TBI's but it showed that she was monophasic pretty much throughout and in general I think this still shows that there is a significant issue here and the vascular doctors seem to be in agreement as far as this is concerned they have recommended that she really needs to have repeat arterial angiogram with likely intervention to try to improve that monophasic flow. With that being said the patient is somewhat reluctant to do this though I think that she is leaning towards it even though she had a lot of bleeding issues in the groin area following the last time she had this done. In regard to her wound she is actually having quite a bit of an issue here with these wounds specifically in the realm of getting them to turn around and heal appropriately. She tells me that the areas are looking much better than they were  but nonetheless she still is noticing that the Betadine  seems to be keeping things a little bit dry but some areas are starting to drain a lot more unfortunately. She is on aspirin and Plavix following the stent placement. This is probably part of the bleeding issues that she had previously in the groin to be peripherally honest. She does have a history of COPD and coronary artery disease as well. 3/5; patient is in for her second clinic visit. She has significant PAD status post revascularization a month ago. According to the patient she is being scheduled for another 1 in a month's time. She has 4 wounds on the right foot lateral foot proximal foot fourth toe and the medial part of the right first toe Electronic Signature(s) Signed: 02/24/2023 4:03:34 PM By: Linton Ham MD Entered By: Linton Ham on 02/24/2023 13:24:56 -------------------------------------------------------------------------------- Physical Exam Details Patient Name: Date of Service: [name], [place] 02/24/2023 12:30 PM Medical Record Number: [id number] Patient Account Number: [id number] Date of Birth/Sex: Treating RN: July 27, 1951 (72 y.o. [name] Primary Care Provider: PA Haig Prophet, [name] Other Clinician: Referring Provider: Treating Provider/Extender: RO BSO N, MICHA EL Ayesha Rumpf in Treatment: 1 Constitutional Sitting or standing Blood Pressure is within target range for patient.. Pulse regular and within target range for patient.Marland Kitchen Respirations regular, non-labored and within target range.. Temperature is normal and within the target range for the patient.Marland Kitchen appears in [name] distress. Notes Wound exam; the wounds are somewhat dry on the lateral part of her right foot. I used a #5 curette to gently debride these areas. [name] evidence of infection. Peripheral pulses are difficult to feel Electronic Signature(s) Signed: 02/24/2023 4:03:34 PM By: Linton Ham MD Entered By: Linton Ham on 02/24/2023  13:25:52 [name] ([id number]) [id number] Page 3 of 7 -------------------------------------------------------------------------------- Physician Orders Details Patient Name: Date of Service: KARLYN, BAYES 02/24/2023 12:30 PM Medical Record Number: [id number] Patient Account Number: [id number] Date of Birth/Sex: Treating RN: 1951-02-17 (72 y.o. 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 Bathing/ [employer] wounds with antibacterial soap and water. Anesthetic (Use 'Patient Medications' Section for Anesthetic Order Entry) Lidocaine applied to wound bed Wound Treatment Wound #1 - T Great oe Wound Laterality: Right, Lateral Cleanser: Byram Ancillary Kit - 15 Day Supply (Generic) 1 x Per Day/30 Days Discharge Instructions: Use supplies as instructed; Kit contains: (15) Saline Bullets; (15) 3x3 Gauze; 15 pr Gloves Cleanser: Soap and Water 1 x Per Day/30 Days Discharge Instructions: Gently cleanse wound with antibacterial soap, rinse and pat dry prior to dressing wounds Prim Dressing: Xeroform-HBD 2x2 (in/in) (Generic) 1 x Per Day/30 Days ary Discharge Instructions: Apply Xeroform-HBD 2x2 (in/in) as directed Secondary Dressing: Conforming Guaze Roll-Small 1 x Per Day/30 Days Discharge Instructions: Apply Conforming Stretch Guaze Bandage as directed Secured With: Medipore T -  64M Medipore H Soft Cloth Surgical T ape ape, 2x2 (in/yd) (Generic) 1 x Per Day/30 Days Wound #2 - Foot Wound Laterality: Right, Lateral Cleanser: Soap and Water 1 x Per Day/30 Days Discharge Instructions: Gently cleanse wound with antibacterial soap, rinse and pat dry prior  to dressing wounds Prim Dressing: Xeroform-HBD 2x2 (in/in) (Generic) 1 x Per Day/30 Days ary Discharge Instructions: Apply Xeroform-HBD 2x2 (in/in) as directed Secondary Dressing: Conforming Guaze Roll-Small (Generic) 1 x Per Day/30 Days Discharge Instructions: Apply Conforming Stretch Guaze Bandage as directed Secured With: Medipore T - 62M Medipore H Soft Cloth Surgical T ape ape, 2x2 (in/yd) 1 x Per Day/30 Days Wound #3 - T Fifth oe Wound Laterality: Right, Lateral Cleanser: Soap and Water 1 x Per Day/30 Days Discharge Instructions: Gently cleanse wound with antibacterial soap, rinse and pat dry prior to dressing wounds Prim Dressing: Xeroform-HBD 2x2 (in/in) (Generic) 1 x Per Day/30 Days [name], [name] ([id number]) [id number] Page 4 of 7 Discharge Instructions: Apply Xeroform-HBD 2x2 (in/in) as directed Secondary Dressing: Conforming Guaze Roll-Small (Generic) 1 x Per Day/30 Days Discharge Instructions: Apply Conforming Stretch Guaze Bandage as directed Secured With: Medipore T - 62M Medipore H Soft Cloth Surgical T ape ape, 2x2 (in/yd) 1 x Per Day/30 Days Wound #4 - T Fourth oe Wound Laterality: Right, Lateral Cleanser: Soap and Water 1 x Per Day/30 Days Discharge Instructions: Gently cleanse wound with antibacterial soap, rinse and pat dry prior to dressing wounds Prim Dressing: Xeroform-HBD 2x2 (in/in) (Generic) 1 x Per Day/30 Days ary Discharge Instructions: Apply Xeroform-HBD 2x2 (in/in) as directed Secondary Dressing: Conforming Guaze Roll-Small (Generic) 1 x Per Day/30 Days Discharge Instructions: Apply Conforming Stretch Guaze Bandage as directed Secured With: Medipore T -  62M Medipore H Soft Cloth Surgical T ape ape, 2x2 (in/yd) 1 x Per Day/30 Days Electronic Signature(s) Signed: 02/24/2023 4:03:34 PM By: Linton Ham MD Signed: 02/25/2023 4:47:39 PM By: Rosalio Loud MSN RN CNS WTA Entered By: Rosalio Loud on 02/24/2023 13:25:28 -------------------------------------------------------------------------------- Problem List Details Patient Name: Date of Service: [name], [place] 02/24/2023 12:30 PM Medical Record Number: [id number] Patient Account Number: [id number] Date of Birth/Sex: Treating RN: 07/07/1951 (72 y.o. 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 02/16/2023 [name] Yes L97.512 Non-pressure chronic ulcer of other part of right foot with fat layer exposed 02/16/2023 [name] Yes Z79.01 Long term (current) use of anticoagulants 02/16/2023 [name] Yes J44.9 Chronic obstructive pulmonary disease, unspecified 02/16/2023 [name] Yes I25.10 Atherosclerotic heart disease of native coronary artery without angina pectoris 02/16/2023 [name] Yes [place], [place] ([id number]) [id number] Page 5 of 7 Inactive Problems Resolved Problems Electronic Signature(s) Signed: 02/24/2023 4:03:34 PM By: Linton Ham MD Entered By: Linton Ham on 02/24/2023 13:23:14 -------------------------------------------------------------------------------- Progress Note Details Patient Name: Date of Service: [name], [name] 02/24/2023 12:30 PM Medical Record Number: [id number] Patient Account Number: [id number] Date of Birth/Sex: Treating RN: 11/27/1951 (72 y.o. [name] Primary Care Provider: PA TIENT, [name] Other Clinician: Referring Provider: Treating Provider/Extender: RO BSO N, MICHA EL Ayesha Rumpf in Treatment:  1 Subjective History of Present Illness (HPI) 02-16-2023 upon evaluation today patient presents for initial inspection here in our clinic concerning issues that she has been having with wounds over the right foot in the realm of several toes as well as the lateral portion of her foot fifth metatarsal location. Subsequently she unfortunately has somewhat poor arterial flow. I do have notes that I reviewed in epic and subsequently the patient did have an angiogram as well back in January. The testing following the angiogram revealed after stent placement on 01-09-2023 that she subsequently  still had somewhat compromised arterial flow following with a right ABI of 0.70 with a TBI of 0.34 and on the left an ABI of 0.76 and the TBI could not be measured. 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 With that being said the follow-up as far as her arterial testing they did not do ABIs and TBI's but it showed that she was monophasic pretty much throughout and in general I think this still shows that there is a significant issue here and the vascular doctors seem to be in agreement as far as this is concerned they have recommended that she really needs to have repeat arterial angiogram with likely intervention to try to improve that monophasic flow. With that being said the patient is somewhat reluctant to do this though I think that she is leaning towards it even though she had a lot of bleeding issues in the groin area following the last time she had this done. In regard to her wound she is actually having quite a bit of an issue here with these wounds specifically in the realm of getting them to turn around and heal appropriately. She tells me that the areas are looking much better than they were but nonetheless she still is noticing that the Betadine seems to be keeping things a little bit dry but some areas are starting to drain a lot more unfortunately. She is on aspirin and Plavix following the stent placement.  This is probably part of the bleeding issues that she had previously in the groin to be peripherally honest. She does have a history of COPD and coronary artery disease as well. 3/5; patient is in for her second clinic visit. She has significant PAD status post revascularization a month ago. According to the patient she is being scheduled for another 1 in a month's time. She has 4 wounds on the right foot lateral foot proximal foot fourth toe and the medial part of the right first toe Objective Constitutional Sitting or standing Blood Pressure is within target range for patient.. Pulse regular and within target range for patient.Marland Kitchen Respirations regular, non-labored and within target range.. Temperature is normal and within the target range for the patient.Marland Kitchen appears in [name] distress. Vitals Time Taken: 12:43 PM, Height: 62 in, Weight: 162 lbs, BMI: 29.6, Temperature: 98.2 F, Pulse: 75 bpm, Respiratory Rate: 16 breaths/min, Blood Pressure: 128/96 mmHg. General Notes: Wound exam; the wounds are somewhat dry on the lateral part of her right foot. I used a #5 curette to gently debride these areas. [name] evidence of infection. Peripheral pulses are difficult to feel Integumentary (Hair, Skin) Wound #1 status is Open. Original cause of wound was Gradually Appeared. The date acquired was: 12/29/2022. The wound has been in treatment 1 weeks. The wound is located on the Right,Lateral T Great. The wound measures 4cm length x 1.8cm width x 0.2cm depth; 5.655cm^2 area and 1.131cm^3 volume. There oe is Fat Layer (Subcutaneous Tissue) exposed. There is a none present amount of drainage noted. Foul odor after cleansing was noted. There is medium (34- 66%) red, pink granulation within the wound bed. There is a small (1-33%) amount of necrotic tissue within the wound bed including Eschar. Wound #2 status is Open. Original cause of wound was Pressure Injury. The date acquired was: 12/29/2022. The wound has been in treatment  1 weeks. The [place], [place] ([id number]) [id number] Page 6 of 7 wound is located on the Right,Lateral Foot. The wound measures 1.5cm length x 1.7cm width x 0.2cm  depth; 2.003cm^2 area and 0.401cm^3 volume. There is Fat Layer (Subcutaneous Tissue) exposed. There is a medium amount of serosanguineous drainage noted. There is small (1-33%) red, pink granulation within the wound bed. There is a medium (34-66%) amount of necrotic tissue within the wound bed including Adherent Slough. Wound #3 status is Open. Original cause of wound was Pressure Injury. The date acquired was: 12/29/2022. The wound has been in treatment 1 weeks. The wound is located on the Right,Lateral T Fifth. The wound measures 0.6cm length x 0.6cm width x 0.1cm depth; 0.283cm^2 area and 0.028cm^3 volume. There oe is Fat Layer (Subcutaneous Tissue) exposed. There is a medium amount of serosanguineous drainage noted. There is [name] granulation within the wound bed. There is a large (67-100%) amount of necrotic tissue within the wound bed including Eschar. Wound #4 status is Open. Original cause of wound was Pressure Injury. The date acquired was: 12/29/2022. The wound has been in treatment 1 weeks. The wound is located on the Right,Lateral T Fourth. The wound measures 0.5cm length x 0.5cm width x 0.1cm depth; 0.196cm^2 area and 0.02cm^3 volume. oe There is Fat Layer (Subcutaneous Tissue) exposed. There is a medium amount of serosanguineous drainage noted. There is [name] granulation within the wound bed. There is a large (67-100%) amount of necrotic tissue within the wound bed including Eschar. 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 There was a Excisional Skin/Subcutaneous Tissue Debridement with a total area of 2.55 sq cm performed by Ricard Dillon, MD. With the following instrument(s): Curette to remove Viable and Non- Viable tissue/material. Material removed includes Subcutaneous Tissue and Slough and after achieving pain control using Lidocaine 4% Topical Solution. [name] specimens were taken. A time out was conducted at 13:23, prior to the start of the procedure. A Minimum amount of bleeding was controlled with Pressure. The procedure was tolerated well. Post Debridement Measurements: 1.5cm length x 1.7cm width x 0.3cm depth; 0.601cm^3 volume. Character of Wound/Ulcer Post Debridement is stable. Post procedure Diagnosis Wound #2: Same as Pre-Procedure Plan Follow-up Appointments: Return Appointment in 1 week. Bathing/ Shower/ Hygiene: Wash wounds with antibacterial soap and water. Anesthetic (Use 'Patient Medications' Section for Anesthetic Order Entry): Lidocaine applied to wound bed WOUND #1: - T Great Wound Laterality: Right, Lateral oe Cleanser: Byram Ancillary Kit - 15 Day Supply (Generic) 1 x Per Day/30 Days Discharge Instructions: Use supplies as instructed; Kit contains: (15) Saline Bullets; (15) 3x3 Gauze; 15 pr Gloves Cleanser: Soap and Water 1 x Per Day/30 Days Discharge Instructions: Gently cleanse wound with antibacterial soap, rinse and pat dry prior to dressing wounds Prim Dressing: Xeroform-HBD 2x2 (in/in) (Generic) 1 x Per Day/30 Days ary Discharge Instructions: Apply Xeroform-HBD 2x2 (in/in) as directed Secondary Dressing: Conforming Guaze Roll-Small 1 x Per Day/30 Days Discharge Instructions: Apply Conforming Stretch Guaze Bandage as directed Secured With: Medipore T - 45M Medipore H Soft Cloth Surgical T ape ape, 2x2 (in/yd) (Generic) 1 x Per Day/30  Days WOUND #2: - Foot Wound Laterality: Right, Lateral Cleanser: Soap and Water 1 x Per Day/30 Days Discharge Instructions: Gently cleanse wound with antibacterial soap, rinse and pat dry prior to dressing wounds Prim Dressing: Xeroform-HBD 2x2 (in/in) (Generic) 1  x Per Day/30 Days ary Discharge Instructions: Apply Xeroform-HBD 2x2 (in/in) as directed Secondary Dressing: Conforming Guaze Roll-Small (Generic) 1 x Per Day/30 Days Discharge Instructions: Apply Conforming Stretch Guaze Bandage as directed Secured With: Medipore T - 107M Medipore H Soft Cloth Surgical T ape ape, 2x2 (in/yd) 1 x Per Day/30 Days WOUND #3: - T Fifth Wound Laterality: Right, Lateral oe Cleanser: Soap and Water 1 x Per Day/30 Days Discharge Instructions: Gently cleanse wound with antibacterial soap, rinse and pat dry prior to dressing wounds Prim Dressing: Xeroform-HBD 2x2 (in/in) (Generic) 1 x Per Day/30 Days ary Discharge Instructions: Apply Xeroform-HBD 2x2 (in/in) as directed Secondary Dressing: Conforming Guaze Roll-Small (Generic) 1 x Per Day/30 Days Discharge Instructions: Apply Conforming Stretch Guaze Bandage as directed Secured With: Medipore T - 107M Medipore H Soft Cloth Surgical T ape ape, 2x2 (in/yd) 1 x Per Day/30 Days WOUND #4: - T Fourth Wound Laterality: Right, Lateral oe Cleanser: Soap and Water 1 x Per Day/30 Days Discharge Instructions: Gently cleanse wound with antibacterial soap, rinse and pat dry prior to dressing wounds Prim Dressing: Xeroform-HBD 2x2 (in/in) (Generic) 1 x Per Day/30 Days ary Discharge Instructions: Apply Xeroform-HBD 2x2 (in/in) as directed [name] ([id number].pdf Page 7 of 7 Secondary Dressing: Conforming Guaze Roll-Small (Generic) 1 x Per Day/30 Days Discharge Instructions: Apply Conforming Stretch Guaze Bandage as directed Secured With: Medipore T - 107M Medipore H Soft Cloth Surgical T ape ape, 2x2 (in/yd) 1 x Per Day/30  Days 1. We are using Xeroform and she is changing this daily. #1 will need to look at vascular notes to determine if she is indeed a candidate for additional revascularization Electronic Signature(s) Signed: 02/24/2023 4:03:34 PM By: Linton Ham MD Entered By: Linton Ham on 02/24/2023 13:27:53 -------------------------------------------------------------------------------- SuperBill Details Patient Name: Date of Service: [name], [place] 02/24/2023 Medical Record Number: [id number] Patient Account Number: [id number] Date of Birth/Sex: Treating RN: [birth year] (72 y.o. 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 Z79.01 Long term (current) use of anticoagulants J44.9 Chronic obstructive pulmonary disease, unspecified I25.10 Atherosclerotic heart disease of native coronary artery without angina pectoris Facility Procedures : CPT4 Code: [id number] Description: [id number] - DEB SUBQ TISSUE 20 SQ CM/< ICD-10 Diagnosis Description L97.512 Non-pressure chronic ulcer of other part of right foot with fat layer exposed Modifier: Quantity: 1 Physician Procedures : CPT4 Code Description Modifier [id number] - WC PHYS SUBQ TISS 20 SQ CM ICD-10 Diagnosis Description L97.512 Non-pressure chronic ulcer of other part of right foot with fat layer exposed Quantity: 1 Electronic Signature(s) Signed: 02/24/2023 4:03:34 PM By: Linton Ham MD Entered By: Linton Ham on 02/24/2023 13:28:09

## 2023-03-03 ENCOUNTER — Encounter (HOSPITAL_BASED_OUTPATIENT_CLINIC_OR_DEPARTMENT_OTHER): Payer: Medicare Other | Admitting: Internal Medicine

## 2023-03-03 DIAGNOSIS — L97512 Non-pressure chronic ulcer of other part of right foot with fat layer exposed: Secondary | ICD-10-CM

## 2023-03-03 DIAGNOSIS — G603 Idiopathic progressive neuropathy: Secondary | ICD-10-CM | POA: Diagnosis not present

## 2023-03-04 NOTE — Progress Notes (Signed)
Victor[place], Johnathan Rowland ([place]:[id number]) [id number] Page 1 of 9 Visit Report for 03/03/2023 Chief Complaint Document Details Patient Name: Date of Service: [name], [name] 03/03/2023 12:00 PM Medical Record Number: [id number] Patient Account Number: [id number] Date of Birth/Sex: Treating RN: 1951/02/27 (72 y.o. [name] Primary Care Provider: PA Haig Prophet, [place] Other Clinician: Referring Provider: Treating Provider/Extender: Clemon Chambers in Treatment: 2 Information Obtained from: Patient Chief Complaint Right foot ulcers Electronic Signature(s) Signed: 03/03/2023 1:27:41 PM By: Kalman Shan DO Entered By: Kalman Shan on 03/03/2023 12:54:08 -------------------------------------------------------------------------------- Debridement Details Patient Name: Date of Service: [name], [place] 03/03/2023 12:00 PM Medical Record Number: [id number] Patient Account Number: [id number] Date of Birth/Sex: Treating RN: May 17, 1951 (72 y.o. [name] Primary Care Provider: PA Haig Prophet, [place] Other Clinician: Referring Provider: Treating Provider/Extender: Clemon Chambers in Treatment: 2 Debridement Performed for Assessment: Wound #1 Right,Lateral T Great oe Performed By: Physician Kalman Shan, MD Debridement Type: Debridement Level of Consciousness (Pre-procedure): Awake and Alert Pre-procedure Verification/Time Out Yes - 12:49 Taken: Start Time: 12:49 Pain Control: Lidocaine 4% T opical Solution T Area Debrided (L x W): otal 4.1 (cm) x 2 (cm) = 8.2 (cm) Tissue and other material debrided: Non-Viable, Eschar, Slough, Slough Level: Non-Viable Tissue Debridement Description: Selective/Open Wound Instrument: Curette Bleeding: Minimum Hemostasis Achieved: Pressure Response to Treatment: Procedure was tolerated well Level of Consciousness (Post- Awake and Alert procedure): [place], Johnathan Rowland ([place]:[id number])  [id number] Page 2 of 9 Post Debridement Measurements of Total Wound Length: (cm) 4.1 Width: (cm) 2 Depth: (cm) 0.1 Volume: (cm) 0.644 Character of Wound/Ulcer Post Debridement: Stable Post Procedure Diagnosis Same as Pre-procedure Electronic Signature(s) Signed: 03/03/2023 1:27:41 PM By: Kalman Shan DO Signed: 03/03/2023 4:34:40 PM By: Levora Dredge Entered By: Levora Dredge on 03/03/2023 12:51:33 -------------------------------------------------------------------------------- Debridement Details Patient Name: Date of Service: [name], [place] 03/03/2023 12:00 PM Medical Record Number: [id number] Patient Account Number: [id number] Date of Birth/Sex: Treating RN: [birth year] (72 y.o. [name] Primary Care Provider: PA TIENT, [place] Other Clinician: Referring Provider: Treating Provider/Extender: Clemon Chambers in Treatment: 2 Debridement Performed for Assessment: Wound #2 Right,Lateral Foot Performed By: Physician Kalman Shan, MD Debridement Type: Debridement Level of Consciousness (Pre-procedure): Awake and Alert Pre-procedure Verification/Time Out Yes - 12:50 Taken: Pain Control: Lidocaine 4% T opical Solution T Area Debrided (L x W): otal 1.8 (cm) x 1.9 (cm) = 3.42 (cm) Tissue and other material debrided: Non-Viable, Slough, Slough Level: Non-Viable Tissue Debridement Description: Selective/Open Wound Instrument: Curette, Forceps, Scissors Bleeding: Minimum Hemostasis Achieved: Pressure Response to Treatment: Procedure was tolerated well Level of Consciousness (Post- Awake and Alert procedure): Post Debridement Measurements of Total Wound Length: (cm) 1.8 Width: (cm) 1.9 Depth: (cm) 0.1 Volume: (cm) 0.269 Character of Wound/Ulcer Post Debridement: Stable Post Procedure Diagnosis Same as Pre-procedure Electronic Signature(s) Signed: 03/03/2023 1:27:41 PM By: Kalman Shan DO Signed: 03/03/2023  4:34:40 PM By: Levora Dredge Entered By: Levora Dredge on 03/03/2023 12:52:33 [name] ([place]:[id number]) [id number] Page 3 of 9 -------------------------------------------------------------------------------- HPI Details Patient Name: Date of Service: Victoria, [place] 03/03/2023 12:00 PM Medical Record Number: [id number] Patient Account Number: [id number] Date of Birth/Sex: Treating RN: 1951-03-20 (72 y.o. [name] Primary Care Provider: PA Haig Prophet, [place] Other Clinician: Referring Provider: Treating Provider/Extender: Clemon Chambers in Treatment: 2 History of Present Illness HPI Description: 02-16-2023 upon evaluation today patient presents for initial inspection here in our clinic concerning issues that she has been having with wounds over the right foot in the realm  of several toes as well as the lateral portion of her foot fifth metatarsal location. Subsequently she unfortunately has somewhat poor arterial flow. I do have notes that I reviewed in epic and subsequently the patient did have an angiogram as well back in January. The testing following the angiogram revealed after stent placement on 01-09-2023 that she subsequently still had somewhat compromised arterial flow following with a right ABI of 0.70 with a TBI of 0.34 and on the left an ABI of 0.76 and the TBI could not be measured. 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 With that being said the follow-up as far as her arterial testing they did not do ABIs and TBI's but it showed that she was monophasic pretty much throughout and in general I think this still shows that there is a significant issue here and the vascular doctors seem to be in agreement as far as this is concerned they have recommended that she really needs to have repeat arterial angiogram with likely intervention to try to improve that monophasic flow. With that being said the patient is somewhat reluctant to do  this though I think that she is leaning towards it even though she had a lot of bleeding issues in the groin area following the last time she had this done. In regard to her wound she is actually having quite a bit of an issue here with these wounds specifically in the realm of getting them to turn around and heal appropriately. She tells me that the areas are looking much better than they were but nonetheless she still is noticing that the Betadine seems to be keeping things a little bit dry but some areas are starting to drain a lot more unfortunately. She is on aspirin and Plavix following the stent placement. This is probably part of the bleeding issues that she had previously in the groin to be peripherally honest. She does have a history of COPD and coronary artery disease as well. 3/5; patient is in for her second clinic visit. She has significant PAD status post revascularization a month ago. According to the patient she is being scheduled for another 1 in a month's time. She has 4 wounds on the right foot lateral foot proximal foot fourth toe and the medial part of the right first toe 3/12; patient presents for follow-up. She has been using Xeroform to the wound beds. She has no issues or complaints today. 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 Electronic Signature(s) Signed: 03/03/2023 1:27:41 PM By: Kalman Shan DO Entered By: Kalman Shan on 03/03/2023 12:55:20 -------------------------------------------------------------------------------- Physical Exam Details Patient Name: Date of Service: CAPREE, MCCATHERN 03/03/2023 12:00 PM Medical Record Number: [id number] Patient Account Number: [id number] Date of Birth/Sex: Treating RN: [birth year] (72 y.o. [name] Primary Care Provider: PA Haig Prophet, [place] Other Clinician: Referring Provider: Treating Provider/Extender: Clemon Chambers in Treatment:  2 Constitutional . Cardiovascular [place], Johnathan Rowland ([place]:[id number]) [id number] Page 4 of 9 . Psychiatric . Notes For open wounds to her right foot with granulation tissue and nonviable tissue. No signs of surrounding infection. Electronic Signature(s) Signed: 03/03/2023 1:27:41 PM By: Kalman Shan DO Entered By: Kalman Shan on 03/03/2023 12:56:01 -------------------------------------------------------------------------------- Physician Orders Details Patient Name: Date of Service: [name], [place] 03/03/2023 12:00 PM Medical Record Number: [place]:[id number] Patient Account Number: [id number] Date of Birth/Sex: Treating RN: 29-Aug-1951 (72 y.o. [name] Primary Care Provider: PA Haig Prophet, [place] Other Clinician: Referring Provider: Treating Provider/Extender: Payton Doughty  Weeks in Treatment: 2 Verbal / Phone Orders: No Diagnosis Coding Follow-up Appointments Return Appointment in 1 week. Bathing/ [employer] wounds with antibacterial soap and water. Anesthetic (Use 'Patient Medications' Section for Anesthetic Order Entry) Lidocaine applied to wound bed Wound Treatment Wound #1 - T Great oe Wound Laterality: Right, Lateral Cleanser: [place] Ancillary Kit - 15 Day Supply (Generic) 1 x Per Day/30 Days Discharge Instructions: Use supplies as instructed; Kit contains: (15) Saline Bullets; (15) 3x3 Gauze; 15 pr Gloves Cleanser: Soap and Water 1 x Per Day/30 Days Discharge Instructions: Gently cleanse wound with antibacterial soap, rinse and pat dry prior to dressing wounds Prim Dressing: Xeroform-HBD 2x2 (in/in) (Generic) 1 x Per Day/30 Days ary Discharge Instructions: Apply Xeroform-HBD 2x2 (in/in) as directed Secondary Dressing: Conforming Guaze Roll-Small 1 x Per Day/30 Days Discharge Instructions: Apply Conforming Stretch Guaze Bandage as directed Secured With: Medipore T - 90M Medipore H Soft Cloth Surgical T ape ape, 2x2  (in/yd) (Generic) 1 x Per Day/30 Days Wound #2 - Foot Wound Laterality: Right, Lateral Cleanser: Soap and Water 1 x Per Day/30 Days Discharge Instructions: Gently cleanse wound with antibacterial soap, rinse and pat dry prior to dressing wounds Prim Dressing: Xeroform-HBD 2x2 (in/in) (Generic) 1 x Per Day/30 Days ary Discharge Instructions: Apply Xeroform-HBD 2x2 (in/in) as directed Secondary Dressing: Conforming Guaze Roll-Small (Generic) 1 x Per Day/30 Days Discharge Instructions: Apply Conforming Stretch Guaze Bandage as directed Secured With: Medipore T - 90M Medipore H Soft Cloth Surgical T ape ape, 2x2 (in/yd) 1 x Per Day/30 Days ROBINN, LOVECCHIO ([id number]) [id number] Page 5 of 9 Wound #3 - T Fifth oe Wound Laterality: Right, Lateral Cleanser: Soap and Water 1 x Per Day/30 Days Discharge Instructions: Gently cleanse wound with antibacterial soap, rinse and pat dry prior to dressing wounds Prim Dressing: Xeroform-HBD 2x2 (in/in) (Generic) 1 x Per Day/30 Days ary Discharge Instructions: Apply Xeroform-HBD 2x2 (in/in) as directed Secondary Dressing: Conforming Guaze Roll-Small (Generic) 1 x Per Day/30 Days Discharge Instructions: Apply Conforming Stretch Guaze Bandage as directed Secured With: Medipore T - 90M Medipore H Soft Cloth Surgical T ape ape, 2x2 (in/yd) 1 x Per Day/30 Days Wound #4 - T Fourth oe Wound Laterality: Right, Lateral Cleanser: Soap and Water 1 x Per Day/30 Days Discharge Instructions: Gently cleanse wound with antibacterial soap, rinse and pat dry prior to dressing wounds Prim Dressing: Xeroform-HBD 2x2 (in/in) (Generic) 1 x Per Day/30 Days ary Discharge Instructions: Apply Xeroform-HBD 2x2 (in/in) as directed Secondary Dressing: Conforming Guaze Roll-Small (Generic) 1 x Per Day/30 Days Discharge Instructions: Apply Conforming Stretch Guaze Bandage as directed Secured With: Medipore T - 90M Medipore H Soft Cloth Surgical T ape ape, 2x2  (in/yd) 1 x Per Day/30 Days Electronic Signature(s) Signed: 03/03/2023 3:46:00 PM By: Kalman Shan DO Signed: 03/03/2023 4:34:40 PM By: Levora Dredge Previous Signature: 03/03/2023 1:27:41 PM Version By: Kalman Shan DO Entered By: Levora Dredge on 03/03/2023 15:43:58 -------------------------------------------------------------------------------- Problem List Details Patient Name: Date of Service: [name], [place] 03/03/2023 12:00 PM Medical Record Number: [id number] Patient Account Number: [id number] Date of Birth/Sex: Treating RN: 1951/11/24 (72 y.o. 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 02/16/2023 No Yes L97.512 Non-pressure chronic ulcer of other part of right foot with fat layer exposed 02/16/2023 No Yes Z79.01 Long term (current) use of anticoagulants 02/16/2023 No Yes J44.9  Chronic obstructive pulmonary disease, unspecified 02/16/2023 No Yes LOWELL, FELICIA ([id number]) [id number] Page 6 of 9 I25.10 Atherosclerotic heart disease of native coronary artery without angina pectoris 02/16/2023 No Yes Inactive Problems Resolved Problems Electronic Signature(s) Signed: 03/03/2023 1:27:41 PM By: Kalman Shan DO Entered By: Kalman Shan on 03/03/2023 12:54:00 -------------------------------------------------------------------------------- Progress Note Details Patient Name: Date of Service: [name], [place] 03/03/2023 12:00 PM Medical Record Number: [id number] Patient Account Number: [id number] Date of Birth/Sex: Treating RN: [birth year] (72 y.o. [name] Primary Care Provider: PA TIENT, [place] Other Clinician: Referring Provider: Treating Provider/Extender: Clemon Chambers in Treatment: 2 Subjective Chief Complaint Information obtained from Patient Right foot ulcers History of Present Illness (HPI) 02-16-2023 upon evaluation today patient presents for initial inspection here in our clinic concerning issues that she has been having with wounds over the right foot in the realm of several toes as well as the lateral portion of her foot fifth metatarsal location. Subsequently she unfortunately has somewhat poor arterial flow. I do have notes that I reviewed in epic and subsequently the patient did have an angiogram as well back in January. The testing following the angiogram revealed after stent placement on 01-09-2023 that she subsequently still had somewhat compromised arterial flow following with a right ABI of 0.70 with a TBI of 0.34 and on the left an ABI of 0.76 and the TBI could not be measured. 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 With that being said the follow-up as far as her arterial testing they did not do ABIs and TBI's but it showed that she was monophasic pretty much throughout and in general I think this still shows that there is a significant issue here and the vascular doctors seem to be in agreement as far as this is concerned they have recommended that she really needs to have repeat arterial angiogram with likely intervention to try to improve that monophasic flow. With that being said the patient is somewhat reluctant to do this though I think that she is leaning towards it even though she had a lot of bleeding issues in the groin area following the last time she had this done. In regard to her wound she is actually having quite a bit of an issue here with these wounds specifically in the realm of getting them to turn around and heal appropriately. She tells me that the areas are looking much better than they were but nonetheless she still is noticing that the Betadine seems to be keeping things a little bit dry but some areas are  starting to drain a lot more unfortunately. She is on aspirin and Plavix following the stent placement. This is probably part of the bleeding issues that she had previously in the groin to be peripherally honest. She does have a history of COPD and coronary artery disease as well. 3/5; patient is in for her second clinic visit. She has significant PAD status post revascularization a month ago. According to the patient she is being scheduled for another 1 in a month's time. She has 4 wounds on the right foot lateral foot proximal foot fourth toe and the medial part of the right first toe 3/12; patient presents for follow-up. She has been using Xeroform to the wound beds. She has no issues or complaints today. 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 Objective Constitutional Vitals Time Taken: 12:15 PM, Height: 62 in, Weight: 162 lbs, BMI: 29.6, Temperature: 97.6 F, Pulse: 66 bpm, Respiratory  Rate: 18 breaths/min, Blood Pressure: 112/66 mmHg. [place], Johnathan Rowland ([place]:[id number]) [id number] Page 7 of 9 General Notes: For open wounds to her right foot with granulation tissue and nonviable tissue. No signs of surrounding infection. Integumentary (Hair, Skin) Wound #1 status is Open. Original cause of wound was Gradually Appeared. The date acquired was: 12/29/2022. The wound has been in treatment 2 weeks. The wound is located on the Right,Lateral T Great. The wound measures 4.1cm length x 2cm width x 0.1cm depth; 6.44cm^2 area and 0.644cm^3 volume. There is oe Fat Layer (Subcutaneous Tissue) exposed. There is no tunneling or undermining noted. There is a medium amount of serosanguineous drainage noted. There is medium (34-66%) red, pink granulation within the wound bed. There is a medium (34-66%) amount of necrotic tissue within the wound bed including Eschar. Wound #2 status is Open. Original cause of wound was Pressure Injury. The date acquired  was: 12/29/2022. The wound has been in treatment 2 weeks. The wound is located on the Right,Lateral Foot. The wound measures 1.8cm length x 1.9cm width x 0.1cm depth; 2.686cm^2 area and 0.269cm^3 volume. There is Fat Layer (Subcutaneous Tissue) exposed. There is no tunneling or undermining noted. There is a medium amount of serosanguineous drainage noted. There is small (1-33%) red, pink granulation within the wound bed. There is a large (67-100%) amount of necrotic tissue within the wound bed including Adherent Slough. Wound #3 status is Open. Original cause of wound was Pressure Injury. The date acquired was: 12/29/2022. The wound has been in treatment 2 weeks. The wound is located on the Right,Lateral T Fifth. The wound measures 0.6cm length x 0.7cm width x 0.1cm depth; 0.33cm^2 area and 0.033cm^3 volume. There oe is no tunneling or undermining noted. There is a medium amount of serosanguineous drainage noted. There is no granulation within the wound bed. There is a large (67-100%) amount of necrotic tissue within the wound bed including Eschar. Wound #4 status is Open. Original cause of wound was Pressure Injury. The date acquired was: 12/29/2022. The wound has been in treatment 2 weeks. The wound is located on the Right,Lateral T Fourth. The wound measures 0.4cm length x 0.6cm width x 0.1cm depth; 0.188cm^2 area and 0.019cm^3 volume. oe There is Fat Layer (Subcutaneous Tissue) exposed. There is no tunneling or undermining noted. There is a medium amount of serosanguineous drainage noted. There is large (67-100%) red, pink granulation within the wound bed. There is a small (1-33%) amount of necrotic tissue within the wound bed including Eschar. 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 I debrided nonviable tissue. No surrounding signs of infection. I recommended continuing with Xeroform for dressing changes. She follows with vein and vascular for her PAD Procedures Wound #1 Pre-procedure diagnosis of Wound #1 is a Neuropathic Ulcer-Non Diabetic located on the Right,Lateral T Great . There was a Selective/Open Wound Non- oe Viable Tissue Debridement with a total area of 8.2 sq cm performed by Kalman Shan, MD. With the following instrument(s): Curette to remove Non-Viable tissue/material. Material removed includes Eschar and Slough and after achieving pain control using Lidocaine 4% Topical Solution. No specimens were taken. A time out was conducted at 12:49, prior to the start of the procedure. A Minimum amount of bleeding was controlled with Pressure. The  procedure was tolerated well. Post Debridement Measurements: 4.1cm length x 2cm width x 0.1cm depth; 0.644cm^3 volume. Character of Wound/Ulcer Post Debridement is stable. Post procedure Diagnosis Wound #1: Same as Pre-Procedure Wound #2 Pre-procedure diagnosis of Wound #2 is a Neuropathic Ulcer-Non Diabetic located on the Right,Lateral Foot . There was a Selective/Open Wound Non-Viable Tissue Debridement with a total area of 3.42 sq cm performed by Kalman Shan, MD. With the following instrument(s): Curette, Forceps, and Scissors to remove Non-Viable tissue/material. Material removed includes [facility] after achieving pain control using Lidocaine 4% Topical Solution. No specimens were taken. A time out was conducted at 12:50, prior to the start of the procedure. A Minimum amount of bleeding was controlled with Pressure. The procedure was tolerated well. Post Debridement Measurements: 1.8cm length x 1.9cm width x 0.1cm depth; 0.269cm^3 volume. Character of Wound/Ulcer Post Debridement is  stable. Post procedure Diagnosis Wound #2: Same as Pre-Procedure Plan 1. In office sharp debridement 2. Xeroform 3. Follow-up in 1 week Electronic Signature(s) Signed: 03/03/2023 1:27:41 PM By: Kalman Shan DO Entered By: Kalman Shan on 03/03/2023 12:57:56 [place], Johnathan Rowland ([id number]) [id number] Page 8 of 9 -------------------------------------------------------------------------------- ROS/PFSH Details Patient Name: Date of Service: VERNIDA, POWNELL 03/03/2023 12:00 PM Medical Record Number: [id number] Patient Account Number: [id number] Date of Birth/Sex: Treating RN: March 18, 1951 (72 y.o. [name] Primary Care Provider: PA Haig Prophet, [place] Other Clinician: Referring Provider: Treating Provider/Extender: Clemon Chambers in Treatment: 2 Information Obtained From Patient Respiratory Medical History: Positive for: Asthma; Chronic Obstructive Pulmonary Disease (COPD) Cardiovascular Medical History: Positive for: Coronary Artery Disease Immunizations Pneumococcal Vaccine: Received Pneumococcal Vaccination: Yes Received Pneumococcal Vaccination On or After 60th Birthday: Yes Implantable Devices None Family and Social History Former smoker; Alcohol Use: Never; Drug Use: No History; Caffeine Use: Never Electronic Signature(s) Signed: 03/03/2023 1:27:41 PM By: Kalman Shan DO Signed: 03/03/2023 4:34:40 PM By: Levora Dredge Entered By: Kalman Shan on 03/03/2023 12:58:30 -------------------------------------------------------------------------------- SuperBill Details Patient Name: Date of Service: [name], [place] 03/03/2023 Medical Record Number: [id number] Patient Account Number: [id number] Date of Birth/Sex: Treating RN: 1951-01-29 (72 y.o. 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 Z79.01 Long term (current) use of anticoagulants J44.9 Chronic obstructive pulmonary disease, unspecified I25.10 Atherosclerotic heart disease of native coronary artery without angina pectoris Facility Procedures : CPT4 Code: [id number] Description: [id number] - DEBRIDE WOUND 1ST 20 SQ CM OR < ICD-10 Diagnosis Description L97.512 Non-pressure chronic ulcer of other part of right foot with fat layer exposed Modifier: Quantity: 1 Physician Procedures : CPT4 Code Description Modifier [id number] - WC PHYS DEBR WO ANESTH 20 SQ CM ICD-10 Diagnosis Description L97.512 Non-pressure chronic ulcer of other part of right foot with fat layer exposed Quantity: 1 Electronic Signature(s) Signed: 03/03/2023 3:44:18 PM By: Levora Dredge Signed: 03/03/2023 3:46:00 PM By: Kalman Shan DO Previous Signature: 03/03/2023 1:27:41 PM Version By: Kalman Shan DO Entered By: Levora Dredge on 03/03/2023 15:44:17

## 2023-03-04 NOTE — Progress Notes (Signed)
[name], [name] Rowland ([place]:[id number]) [id number] Page 1 of 13 Visit Report for 03/03/2023 Arrival Information Details Patient Name: Date of Service: [name] Rowland, [name] Rowland 03/03/2023 12:00 PM Medical Record Number: [id number] Patient Account Number: [id number] Date of Birth/Sex: Treating RN: October 22, 1951 (72 y.o. [name] Rowland Close Primary Care Shanay Woolman: PA [place], [place] Other Clinician: Referring Vue Pavon: Treating Doyle Kunath/Extender: Clemon Chambers in Treatment: 2 Visit Information History Since Last Visit Added or deleted any medications: No Patient Arrived: Wheel Chair Any new allergies or adverse reactions: No Arrival Time: 12:16 Had a fall or experienced change in No Accompanied By: daughter activities of daily living that may affect Transfer Assistance: EasyPivot Patient Lift risk of falls: Patient Identification Verified: Yes Hospitalized since last visit: No Secondary Verification Process Completed: Yes Has Dressing in Place as Prescribed: Yes Patient Requires Transmission-Based Precautions: No Has Footwear/Offloading in Place as Yes Patient Has Alerts: Yes Prescribed: Patient Alerts: Patient on Blood Thinner Right: Surgical Shoe with Pressure Relief Insole PLAVIX Pain Present Now: No ASA 81 mg R ABI 0.70 TBI 0.34 L ABI 0.76 TBI 0.00 Electronic Signature(s) Signed: 03/03/2023 4:34:40 PM By: Levora Dredge Entered By: Levora Dredge on 03/03/2023 12:18:13 -------------------------------------------------------------------------------- Clinic Level of Care Assessment Details Patient Name: Date of Service: [name] Rowland, [name] Rowland 03/03/2023 12:00 PM Medical Record Number: [id number] Patient Account Number: [id number] Date of Birth/Sex: Treating RN: 14-Aug-1951 (72 y.o. [name] Rowland Close Primary Care Cejay Cambre: PA [place]ig [place], [place] Other Clinician: Referring Xareni Kelch: Treating Osias Resnick/Extender: Clemon Chambers in Treatment: 2 Clinic  Level of Care Assessment Items TOOL 1 Quantity Score '[]'$  - 0 Use when EandM and Procedure is performed on INITIAL visit ASSESSMENTS - Nursing Assessment / Reassessment '[]'$  - 0 General Physical Exam (combine w/ comprehensive assessment (listed just below) when performed on new pt. evals) '[]'$  - 0 Comprehensive Assessment (HX, ROS, Risk Assessments, Wounds Hx, etc.) Jeffries, Marguarite ([place]:[id number]) [phone].pdf Page 2 of 13 ASSESSMENTS - Wound and Skin Assessment / Reassessment '[]'$  - 0 Dermatologic / Skin Assessment (not related to wound area) ASSESSMENTS - Ostomy and/or Continence Assessment and Care '[]'$  - 0 Incontinence Assessment and Management '[]'$  - 0 Ostomy Care Assessment and Management (repouching, etc.) PROCESS - Coordination of Care '[]'$  - 0 Simple Patient / Family Education for ongoing care '[]'$  - 0 Complex (extensive) Patient / Family Education for ongoing care '[]'$  - 0 Staff obtains Programmer, systems, Records, T Results / Process Orders est '[]'$  - 0 Staff telephones HHA, Nursing Homes / Clarify orders / etc '[]'$  - 0 Routine Transfer to another Facility (non-emergent condition) '[]'$  - 0 Routine Hospital Admission (non-emergent condition) '[]'$  - 0 New Admissions / Biomedical engineer / Ordering NPWT Apligraf, etc. , '[]'$  - 0 Emergency Hospital Admission (emergent condition) PROCESS - Special Needs '[]'$  - 0 Pediatric / Minor Patient Management '[]'$  - 0 Isolation Patient Management '[]'$  - 0 Hearing / Language / Visual special needs '[]'$  - 0 Assessment of Community assistance (transportation, D/C planning, etc.) '[]'$  - 0 Additional assistance / Altered mentation '[]'$  - 0 Support Surface(s) Assessment (bed, cushion, seat, etc.) INTERVENTIONS - Miscellaneous '[]'$  - 0 External ear exam '[]'$  - 0 Patient Transfer (multiple staff / Civil Service fast streamer / Similar devices) '[]'$  - 0 Simple Staple / Suture removal (25 or less) '[]'$  - 0 Complex Staple / Suture removal (26 or more) '[]'$  -  0 Hypo/Hyperglycemic Management (do not check if billed separately) '[]'$  - 0 Ankle / Brachial Index (ABI) - do not check if billed separately Has the patient been seen at  the hospital within the last three years: Yes Total Score: 0 Level Of Care: ____ Electronic Signature(s) Signed: 03/03/2023 4:34:40 PM By: Levora Dredge Entered By: Levora Dredge on 03/03/2023 15:44:06 -------------------------------------------------------------------------------- Encounter Discharge Information Details Patient Name: Date of Service: [name] Rowland, [name] Rowland 03/03/2023 12:00 PM Medical Record Number: [id number] Patient Account Number: [id number] Date of Birth/Sex: Treating RN: Apr 08, 1951 (72 y.o. [name] Rowland Close Primary Care Renatta Shrieves: PA [place], [place] Other Clinician: Referring Leiland Mihelich: Treating Graviel Payeur/Extender: Clemon Chambers in Treatment: [place], [place] ([place]:[id number]) [id number] Page 3 of 13 Encounter Discharge Information Items Post Procedure Vitals Discharge Condition: Stable Temperature (F): 97.6 Ambulatory Status: Wheelchair Pulse (bpm): 66 Discharge Destination: Home Respiratory Rate (breaths/min): 18 Transportation: Private Auto Blood Pressure (mmHg): 112/66 Accompanied By: daughter Schedule Follow-up Appointment: Yes Clinical Summary of Care: Electronic Signature(s) Signed: 03/03/2023 3:48:08 PM By: Levora Dredge Entered By: Levora Dredge on 03/03/2023 15:48:08 -------------------------------------------------------------------------------- Lower Extremity Assessment Details Patient Name: Date of Service: [name] Rowland, [name] Rowland 03/03/2023 12:00 PM Medical Record Number: [place]:[id number] Patient Account Number: [id number] Date of Birth/Sex: Treating RN: 20-Aug-1951 (72 y.o. [name] Rowland Close Primary Care Toula Miyasaki: PA [place], [place] Other Clinician: Referring Lancer Thurner: Treating Herlinda Heady/Extender: Clemon Chambers in Treatment:  2 Edema Assessment Assessed: [Left: No] [Right: No] Edema: [Left: Ye] [Right: s] Vascular Assessment Pulses: Dorsalis Pedis Doppler Audible: [Right:Yes] Posterior Tibial Doppler Audible: [Right:Yes] Electronic Signature(s) Signed: 03/03/2023 4:34:40 PM By: Levora Dredge Entered By: Levora Dredge on 03/03/2023 12:36:58 -------------------------------------------------------------------------------- Multi Wound Chart Details Patient Name: Date of Service: [name] Rowland, [place] 03/03/2023 12:00 PM Medical Record Number: [id number] Patient Account Number: [id number] Date of Birth/Sex: Treating RN: Apr 29, 1951 (72 y.o. [name] Rowland Close Primary Care Dedrick Heffner: PA [address], [place] Other Clinician: LATRESE, HANLEY ([place]:[id number]) [id number] Page 4 of 13 Referring Damali Broadfoot: Treating Elijah Michaelis/Extender: Clemon Chambers in Treatment: 2 Vital Signs Height(in): 68 Pulse(bpm): 3 Weight(lbs): 162 Blood Pressure(mmHg): 112/66 Body Mass Index(BMI): 29.6 Temperature(F): 97.6 Respiratory Rate(breaths/min): 18 [1:Photos:] Right, Lateral T Great oe Right, Lateral Foot Right, Lateral T Fifth oe Wound Location: Gradually Appeared Pressure Injury Pressure Injury 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 Etiology: Asthma, Chronic Obstructive Asthma, Chronic Obstructive Asthma, Chronic Obstructive Comorbid History: Pulmonary Disease (COPD), Coronary Pulmonary Disease (COPD), Coronary Pulmonary Disease (COPD), Coronary Artery Disease Artery Disease Artery Disease 12/29/2022 12/29/2022 12/29/2022 Date Acquired: '2 2 2 '$ Weeks of Treatment: Open Open Open Wound Status: No No No Wound Recurrence: Yes Yes Yes Pending A mputation on Presentation: 2x4.1x0.1 1.8x1.9x0.1 0.6x0.7x0.1 Measurements L x W x D (cm) 6.44 2.686 0.33 A (cm) : rea 0.644 0.269 0.033 Volume (cm) : -21.50% -14.00%  88.80% % Reduction in A rea: 39.20% 42.90% 88.80% % Reduction in Volume: Full Thickness Without Exposed Full Thickness Without Exposed Unclassifiable Classification: Support Structures Support Structures Medium Medium Medium Exudate A mount: Serosanguineous Serosanguineous Serosanguineous Exudate Type: red, brown red, brown red, brown Exudate Color: Medium (34-66%) Small (1-33%) None Present (0%) Granulation Amount: Red, Pink Red, Pink N/A Granulation Quality: Medium (34-66%) Large (67-100%) Large (67-100%) Necrotic Amount: Eschar Adherent Slough Eschar Necrotic Tissue: Fat Layer (Subcutaneous Tissue): Yes Fat Layer (Subcutaneous Tissue): Yes Fascia: No Exposed Structures: Fascia: No Fascia: No Fat Layer (Subcutaneous Tissue): No Tendon: No Tendon: No Tendon: No Muscle: No Muscle: No Muscle: No Joint: No Joint: No Joint: No Bone: No Bone: No Bone: No None None None Epithelialization: Wound Number: 4 N/A N/A Photos: N/A N/A Right, Lateral T Fourth oe N/A N/A Wound Location: Pressure Injury N/A  N/A Wounding Event: Neuropathic Ulcer-Non Diabetic N/A N/A Primary Etiology: Asthma, Chronic Obstructive N/A N/A Comorbid History: Pulmonary Disease (COPD), Coronary Artery Disease 12/29/2022 N/A N/A Date Acquired: 2 N/A N/A Weeks of Treatment: Open N/A N/A Wound Status: No N/A N/A Wound Recurrence: Yes N/A N/A Pending A mputation on Presentation: 0.4x0.6x0.1 N/A N/A Measurements L x W x D (cm) 0.188 N/A N/A A (cm) : rea 0.019 N/A N/A Volume (cm) [name] ([place]:[id number].pdf Page 5 of 13 65.80% N/A N/A % Reduction in A rea: 65.50% N/A N/A % Reduction in Volume: Partial Thickness N/A N/A Classification: Medium N/A N/A Exudate A mount: Serosanguineous N/A N/A Exudate Type: red, brown N/A N/A Exudate Color: Large (67-100%) N/A N/A Granulation A mount: Red, Pink N/A N/A Granulation Quality: Small (1-33%) N/A  N/A Necrotic A mount: Eschar N/A N/A Necrotic Tissue: Fat Layer (Subcutaneous Tissue): Yes N/A N/A Exposed Structures: Fascia: No Tendon: No Muscle: No Joint: No Bone: No None N/A N/A Epithelialization: Treatment Notes Electronic Signature(s) Signed: 03/03/2023 4:34:40 PM By: Levora Dredge Entered By: Levora Dredge on 03/03/2023 12:37:06 -------------------------------------------------------------------------------- Multi-Disciplinary Care Plan Details Patient Name: Date of Service: [name] Rowland, [place] 03/03/2023 12:00 PM Medical Record Number: [place]:[id number] Patient Account Number: [id number] Date of Birth/Sex: Treating RN: 1951-07-20 (72 y.o. [name] Rowland Close Primary Care Arita Severtson: PA [place], [place] Other Clinician: Referring Josemaria Brining: Treating Veronika Heard/Extender: Clemon Chambers in Treatment: 2 Active Inactive Necrotic Tissue Nursing Diagnoses: Impaired tissue integrity related to necrotic/devitalized tissue Knowledge deficit related to management of necrotic/devitalized tissue Goals: Necrotic/devitalized tissue will be minimized in the wound bed Date Initiated: 02/16/2023 Target Resolution Date: 03/17/2023 Goal Status: Active Patient/caregiver will verbalize understanding of reason and process for debridement of necrotic tissue Date Initiated: 02/16/2023 Target Resolution Date: 03/17/2023 Goal Status: Active Interventions: Assess patient pain level pre-, during and post procedure and prior to discharge Provide education on necrotic tissue and debridement process Treatment Activities: Apply topical anesthetic as ordered : 02/16/2023 Excisional debridement : 02/16/2023 Notes: Wound/Skin Impairment AITZA, [name] Rowland ([place]:[id number]) [phone].pdf Page 6 of 13 Nursing Diagnoses: Impaired tissue integrity Knowledge deficit related to ulceration/compromised skin integrity Goals: Patient will have a decrease in wound volume by X% from date:  (specify in notes) Date Initiated: 02/16/2023 Target Resolution Date: 03/17/2023 Goal Status: Active Patient/caregiver will verbalize understanding of skin care regimen Date Initiated: 02/16/2023 Date Inactivated: 03/03/2023 Target Resolution Date: 03/17/2023 Goal Status: Met Ulcer/skin breakdown will have a volume reduction of 30% by week 4 Date Initiated: 02/16/2023 Target Resolution Date: 03/17/2023 Goal Status: Active Ulcer/skin breakdown will have a volume reduction of 50% by week 8 Date Initiated: 02/16/2023 Target Resolution Date: 04/17/2023 Goal Status: Active Ulcer/skin breakdown will have a volume reduction of 80% by week 12 Date Initiated: 02/16/2023 Target Resolution Date: 05/17/2023 Goal Status: Active Interventions: Assess patient/caregiver ability to obtain necessary supplies Assess patient/caregiver ability to perform ulcer/skin care regimen upon admission and as needed Assess ulceration(s) every visit Provide education on ulcer and skin care Treatment Activities: Referred to DME Tenzin Pavon for dressing supplies : 02/16/2023 Skin care regimen initiated : 02/16/2023 Notes: Electronic Signature(s) Signed: 03/03/2023 3:44:39 PM By: Levora Dredge Entered By: Levora Dredge on 03/03/2023 15:44:39 -------------------------------------------------------------------------------- Pain Assessment Details Patient Name: Date of Service: [name] Rowland, [name] Rowland 03/03/2023 12:00 PM Medical Record Number: [place]:[id number] Patient Account Number: [id number] Date of Birth/Sex: Treating RN: 1951/07/19 (72 y.o. [name] Rowland Close Primary Care Jensyn Shave: PA [place], [place] Other Clinician: Referring Lera Gaines: Treating Enna Warwick/Extender: Clemon Chambers in Treatment: 2 Active Problems  Location of Pain Severity and Description of Pain Patient Has Paino No Site Locations Rate the pain. [place], [name] Rowland ([place]:[id number]) [id number] Page 7 of 13 Rate the pain. Current  Pain Level: 0 Pain Management and Medication Current Pain Management: Electronic Signature(s) Signed: 03/03/2023 4:34:40 PM By: Levora Dredge Entered By: Levora Dredge on 03/03/2023 12:18:35 -------------------------------------------------------------------------------- Patient/Caregiver Education Details Patient Name: Date of Service: [name] Rowland, [place] 3/12/2024andnbsp12:00 PM Medical Record Number: [place]:[id number] Patient Account Number: [id number] Date of Birth/Gender: Treating RN: 11/14/1951 (72 y.o. [name] Rowland Close Primary Care Physician: PA [place]ig [place], [place] Other Clinician: Referring Physician: Treating Physician/Extender: Clemon Chambers in Treatment: 2 Education Assessment Education Provided To: Patient Education Topics Provided Wound Debridement: Handouts: Wound Debridement Methods: Explain/Verbal Responses: State content correctly Wound/Skin Impairment: Handouts: Caring for Your Ulcer Methods: Explain/Verbal Responses: State content correctly Electronic Signature(s) Signed: 03/03/2023 4:34:40 PM By: Levora Dredge Entered By: Levora Dredge on 03/03/2023 15:44:53 Bultman, Donye ([place]:[id number]) [id number] Page 8 of 13 -------------------------------------------------------------------------------- Wound Assessment Details Patient Name: Date of Service: [name] Rowland, [name] Rowland 03/03/2023 12:00 PM Medical Record Number: [place]:[id number] Patient Account Number: [id number] Date of Birth/Sex: Treating RN: 1951-01-07 (72 y.o. [name] Rowland Close Primary Care Samarion Ehle: PA [place], [place] Other Clinician: Referring Samadhi Mahurin: Treating Shylo Dillenbeck/Extender: Clemon Chambers in Treatment: 2 Wound Status Wound Number: 1 Primary Neuropathic Ulcer-Non Diabetic Etiology: Wound Location: Right, Lateral T Great oe Wound Open Wounding Event: Gradually Appeared Status: Date Acquired: 12/29/2022 Comorbid Asthma, Chronic Obstructive Pulmonary  Disease (COPD), Weeks Of Treatment: 2 History: Coronary Artery Disease Clustered Wound: No Pending Amputation On Presentation Photos Wound Measurements Length: (cm) 4.1 Width: (cm) 2 Depth: (cm) 0.1 Area: (cm) 6.44 Volume: (cm) 0.644 % Reduction in Area: -21.5% % Reduction in Volume: 39.2% Epithelialization: None Tunneling: No Undermining: No Wound Description Classification: Full Thickness Without Exposed Support Structures Exudate Amount: Medium Exudate Type: Serosanguineous Exudate Color: red, brown Foul Odor After Cleansing: No Slough/Fibrino Yes Wound Bed Granulation Amount: Medium (34-66%) Exposed Structure Granulation Quality: Red, Pink Fascia Exposed: No Necrotic Amount: Medium (34-66%) Fat Layer (Subcutaneous Tissue) Exposed: Yes Necrotic Quality: Eschar Tendon Exposed: No Muscle Exposed: No Joint Exposed: No Bone Exposed: No Treatment Notes Wound #1 (Toe Great) Wound Laterality: Right, Lateral Cleanser [place] Ancillary Kit - 15 Day Supply Discharge Instruction: Use supplies as instructed; Kit contains: (15) Saline Bullets; (15) 3x3 Gauze; 15 pr Gloves Hanke, Lashala ([place]:[id number].pdf Page 9 of 13 Soap and Water Discharge Instruction: Gently cleanse wound with antibacterial soap, rinse and pat dry prior to dressing wounds Peri-Wound Care Topical Primary Dressing Xeroform-HBD 2x2 (in/in) Discharge Instruction: Apply Xeroform-HBD 2x2 (in/in) as directed Secondary Dressing Conforming [place] Roll-Small Discharge Instruction: [place] as directed Secured With [place] Soft Cloth Surgical T ape ape, 2x2 (in/yd) Compression Wrap Compression Stockings Add-Ons Electronic Signature(s) Signed: 03/03/2023 12:39:30 PM By: Levora Dredge Entered By: Levora Dredge on 03/03/2023 12:39:30 -------------------------------------------------------------------------------- Wound Assessment  Details Patient Name: Date of Service: CHRISHAWNA, MILLIKIN 03/03/2023 12:00 PM Medical Record Number: [place]:[id number] Patient Account Number: [id number] Date of Birth/Sex: Treating RN: 04-27-1951 (72 y.o. [name] Rowland Close Primary Care Shanisha Lech: PA [place], [place] Other Clinician: Referring Heber Hoog: Treating Davis Vannatter/Extender: Clemon Chambers in Treatment: 2 Wound Status Wound Number: 2 Primary Neuropathic Ulcer-Non Diabetic Etiology: Wound Location: Right, Lateral Foot Wound Open Wounding Event: Pressure Injury Status: Date Acquired: 12/29/2022 Comorbid Asthma, Chronic Obstructive Pulmonary Disease (COPD), Weeks Of Treatment: 2 History: Coronary Artery Disease Clustered Wound: No Pending Amputation On  Presentation Photos Wound Measurements Length: (cm) 1.8 Width: (cm) 1.9 [name] Rowland, [name] Rowland ([id number]) Depth: (cm) 0.1 Area: (cm) 2.686 Volume: (cm) 0.269 % Reduction in Area: -14% % Reduction in Volume: 42.9% [id number] Page 10 of 13 Epithelialization: None Tunneling: No Undermining: No Wound Description Classification: Full Thickness Without Exposed Suppor Exudate Amount: Medium Exudate Type: Serosanguineous Exudate Color: red, brown t Structures Foul Odor After Cleansing: No Slough/Fibrino Yes Wound Bed Granulation Amount: Small (1-33%) Exposed Structure Granulation Quality: Red, Pink Fascia Exposed: No Necrotic Amount: Large (67-100%) Fat Layer (Subcutaneous Tissue) Exposed: Yes Necrotic Quality: Adherent Slough Tendon Exposed: No Muscle Exposed: No Joint Exposed: No Bone Exposed: No Treatment Notes Wound #2 (Foot) Wound Laterality: Right, Lateral Cleanser Soap and Water Discharge Instruction: Gently cleanse wound with antibacterial soap, rinse and pat dry prior to dressing wounds Peri-Wound Care Topical Primary Dressing Xeroform-HBD 2x2 (in/in) Discharge Instruction: Apply Xeroform-HBD 2x2 (in/in) as directed Secondary  Dressing [place] Roll-Small Discharge Instruction: Apply Conforming Stretch Guaze Bandage as directed Secured With Medipore T - 54M Medipore H Soft Cloth Surgical T ape ape, 2x2 (in/yd) Compression Wrap Compression Stockings Add-Ons Electronic Signature(s) Signed: 03/03/2023 4:34:40 PM By: Levora Dredge Entered By: Levora Dredge on 03/03/2023 12:34:50 -------------------------------------------------------------------------------- Wound Assessment Details Patient Name: Date of Service: [name] Rowland, [name] Rowland 03/03/2023 12:00 PM Medical Record Number: [id number] Patient Account Number: [id number] Date of Birth/Sex: Treating RN: [birth year] (72 y.o. [name] Rowland Close Primary Care Deloros Beretta: PA [place], [place] Other Clinician: Referring Krystalynn Ridgeway: Treating Chanie Soucek/Extender: Clemon Chambers in Treatment: 2 Wound Status [name] Rowland, [name] Rowland ([id number]) [id number] Page 11 of 13 Wound Number: 3 Primary Neuropathic Ulcer-Non Diabetic Etiology: Wound Location: Right, Lateral T Fifth oe Wound Open Wounding Event: Pressure Injury Status: Date Acquired: 12/29/2022 Comorbid Asthma, Chronic Obstructive Pulmonary Disease (COPD), Weeks Of Treatment: 2 History: Coronary Artery Disease Clustered Wound: No Pending Amputation On Presentation Photos Wound Measurements Length: (cm) 0.6 Width: (cm) 0.7 Depth: (cm) 0.1 Area: (cm) 0.33 Volume: (cm) 0.033 % Reduction in Area: 88.8% % Reduction in Volume: 88.8% Epithelialization: None Tunneling: No Undermining: No Wound Description Classification: Unclassifiable Exudate Amount: Medium Exudate Type: Serosanguineous Exudate Color: red, brown Foul Odor After Cleansing: No Slough/Fibrino Yes Wound Bed Granulation Amount: None Present (0%) Exposed Structure Necrotic Amount: Large (67-100%) Fascia Exposed: No Necrotic Quality: Eschar Fat Layer (Subcutaneous Tissue) Exposed: No Tendon Exposed:  No Muscle Exposed: No Joint Exposed: No Bone Exposed: No Treatment Notes Wound #3 (Toe Fifth) Wound Laterality: Right, Lateral Cleanser Soap and Water Discharge Instruction: Gently cleanse wound with antibacterial soap, rinse and pat dry prior to dressing wounds Peri-Wound Care Topical Primary Dressing Xeroform-HBD 2x2 (in/in) Discharge Instruction: Apply Xeroform-HBD 2x2 (in/in) as directed Secondary Dressing [place] Roll-Small Discharge Instruction: [place] as directed Secured With [place] H Soft Cloth Surgical T ape ape, 2x2 (in/yd) Compression Wrap Compression Stockings Add-Ons Electronic Signature(s) Signed: 03/03/2023 4:34:40 PM By: Vedia Coffer, Bekki ([id number]) [id number].pdf Page 12 of 13 Entered By: Levora Dredge on 03/03/2023 12:35:35 -------------------------------------------------------------------------------- Wound Assessment Details Patient Name: Date of Service: [name] Rowland, [name] Rowland 03/03/2023 12:00 PM Medical Record Number: [id number] Patient Account Number: [id number] Date of Birth/Sex: Treating RN: [birth year] (72 y.o. [name] Rowland Close Primary Care Syleena Mchan: PA [place], [place] Other Clinician: Referring Rayden Scheper: Treating Biance Moncrief/Extender: Clemon Chambers in Treatment: 2 Wound Status Wound Number: 4 Primary Neuropathic Ulcer-Non Diabetic Etiology: Wound Location: Right, Lateral T Fourth oe Wound Open Wounding Event: Pressure Injury Status: Date Acquired: 12/29/2022 Comorbid Asthma,  Chronic Obstructive Pulmonary Disease (COPD), Weeks Of Treatment: 2 History: Coronary Artery Disease Clustered Wound: No Pending Amputation On Presentation Photos Wound Measurements Length: (cm) 0.4 Width: (cm) 0.6 Depth: (cm) 0.1 Area: (cm) 0.188 Volume: (cm) 0.019 % Reduction in Area: 65.8% % Reduction in Volume: 65.5% Epithelialization: None Tunneling:  No Undermining: No Wound Description Classification: Partial Thickness Exudate Amount: Medium Exudate Type: Serosanguineous Exudate Color: red, brown Foul Odor After Cleansing: No Slough/Fibrino Yes Wound Bed Granulation Amount: Large (67-100%) Exposed Structure Granulation Quality: Red, Pink Fascia Exposed: No Necrotic Amount: Small (1-33%) Fat Layer (Subcutaneous Tissue) Exposed: Yes Necrotic Quality: Eschar Tendon Exposed: No Muscle Exposed: No Joint Exposed: No Bone Exposed: No Treatment Notes Wound #4 (Toe Fourth) Wound Laterality: Right, Lateral [name] Rowland, [name] Rowland ([place]:[id number].pdf Page 13 of 13 Cleanser Soap and Water Discharge Instruction: Gently cleanse wound with antibacterial soap, rinse and pat dry prior to dressing wounds Peri-Wound Care Topical Primary Dressing Xeroform-HBD 2x2 (in/in) Discharge Instruction: Apply Xeroform-HBD 2x2 (in/in) as directed Secondary Dressing Conforming [place] Roll-Small Discharge Instruction: [place] as directed Secured With [place] H Soft Cloth Surgical T ape ape, 2x2 (in/yd) Compression Wrap Compression Stockings Add-Ons Electronic Signature(s) Signed: 03/03/2023 4:34:40 PM By: Levora Dredge Entered By: Levora Dredge on 03/03/2023 12:36:38 -------------------------------------------------------------------------------- Vitals Details Patient Name: Date of Service: [name] Rowland, [place] 03/03/2023 12:00 PM Medical Record Number: [id number] Patient Account Number: [id number] Date of Birth/Sex: Treating RN: 04-13-1951 (72 y.o. [name] Rowland Close [facility] Rawson Minix: PA [place], [place] Other Clinician: Referring Gearld Kerstein: Treating Evalee Gerard/Extender: Clemon Chambers in Treatment: 2 Vital Signs Time Taken: 12:15 Temperature (F): 97.6 Height (in): 62 Pulse (bpm): 66 Weight (lbs): 162 Respiratory Rate (breaths/min): 18 Body Mass  Index (BMI): 29.6 Blood Pressure (mmHg): 112/66 Reference Range: 80 - 120 mg / dl Electronic Signature(s) Signed: 03/03/2023 4:34:40 PM By: Levora Dredge Entered By: Levora Dredge on 03/03/2023 12:18:29

## 2023-03-10 ENCOUNTER — Encounter: Payer: Medicare Other | Admitting: Physician Assistant

## 2023-03-10 DIAGNOSIS — G603 Idiopathic progressive neuropathy: Secondary | ICD-10-CM | POA: Diagnosis not present

## 2023-03-10 NOTE — Progress Notes (Signed)
[place], [name] ([place]:[id number]) [id number] Page 1 of 10 Visit Report for 03/10/2023 Chief Complaint Document Details Patient Name: Date of Service: [name], [name] 03/10/2023 12:00 PM Medical Record Number: [id number] Patient Account Number: [id number] Date of Birth/Sex: Treating RN: 1951/10/09 (72 y.o. [name] Primary Care Provider: PA [name], [place] Other Clinician: Referring Provider: Treating Provider/Extender: [name] in Treatment: 3 Information Obtained from: Patient Chief Complaint Right foot ulcers Electronic Signature(s) Signed: 03/10/2023 12:34:42 PM By: Victoria Keeler PA-C Entered By: [name] on 03/10/2023 12:34:42 -------------------------------------------------------------------------------- Debridement Details Patient Name: Date of Service: [name], [place] 03/10/2023 12:00 PM Medical Record Number: [id number]:[id number] Patient Account Number: [id number] Date of Birth/Sex: Treating RN: 09-May-1951 (72 y.o. [name] Primary Care Provider: PA [name], [place] Other Clinician: Referring Provider: Treating Provider/Extender: [name] in Treatment: 3 Debridement Performed for Assessment: Wound #1 Right,Lateral T Great oe Performed By: Physician [name]., PA-C Debridement Type: Debridement Level of Consciousness (Pre-procedure): Awake and Alert Pre-procedure Verification/Time Out Yes - 12:42 Taken: Start Time: 12:42 Pain Control: Lidocaine 4% T opical Solution T Area Debrided (L x W): otal 4 (cm) x 1.7 (cm) = 6.8 (cm) Tissue and other material debrided: Viable, Non-Viable, Slough, Subcutaneous, Slough Level: Skin/Subcutaneous Tissue Debridement Description: Excisional Instrument: Curette Bleeding: Minimum Hemostasis Achieved: Pressure Response to Treatment: Procedure was tolerated well Level of Consciousness (Post- Awake and Alert procedure): [place], [name] ([place]:[id number])  [id number] Page 2 of 10 Post Debridement Measurements of Total Wound Length: (cm) 4 Width: (cm) 1.7 Depth: (cm) 0.1 Volume: (cm) 0.534 Character of Wound/Ulcer Post Debridement: Stable Post Procedure Diagnosis Same as Pre-procedure Electronic Signature(s) Signed: 03/10/2023 4:28:49 PM By: [name] Signed: 03/10/2023 6:19:02 PM By: Victoria Keeler PA-C Entered By: [name] on 03/10/2023 12:46:25 -------------------------------------------------------------------------------- Debridement Details Patient Name: Date of Service: [name], [place] 03/10/2023 12:00 PM Medical Record Number: [id number] Patient Account Number: [id number] Date of Birth/Sex: Treating RN: September 08, 1951 (72 y.o. [name] Primary Care Provider: PA [name], [place] Other Clinician: Referring Provider: Treating Provider/Extender: [name] in Treatment: 3 Debridement Performed for Assessment: Wound #4 Right,Lateral T Fourth oe Performed By: Physician [name]., PA-C Debridement Type: Debridement Level of Consciousness (Pre-procedure): Awake and Alert Pre-procedure Verification/Time Out Yes - 12:46 Taken: Start Time: 12:46 Pain Control: Lidocaine 4% T opical Solution T Area Debrided (L x W): otal 0.1 (cm) x 0.2 (cm) = 0.02 (cm) Tissue and other material debrided: Viable, Non-Viable, Slough, Subcutaneous, Slough Level: Skin/Subcutaneous Tissue Debridement Description: Excisional Instrument: Curette Bleeding: Minimum Hemostasis Achieved: Pressure Response to Treatment: Procedure was tolerated well Level of Consciousness (Post- Awake and Alert procedure): Post Debridement Measurements of Total Wound Length: (cm) 0.1 Width: (cm) 0.2 Depth: (cm) 0.1 Volume: (cm) 0.002 Character of Wound/Ulcer Post Debridement: Stable Post Procedure Diagnosis Same as Pre-procedure Electronic Signature(s) Signed: 03/10/2023 4:28:49 PM By: [name] Signed: 03/10/2023 6:19:02 PM By: Victoria Keeler PA-C Entered By: [name] on 03/10/2023 12:48:32 [name] ([place]:[id number]) [id number] Page 3 of 10 -------------------------------------------------------------------------------- Debridement Details Patient Name: Date of Service: [name], [name] 03/10/2023 12:00 PM Medical Record Number: [place]:[id number] Patient Account Number: [id number] Date of Birth/Sex: Treating RN: 1951-11-29 (72 y.o. [name] Primary Care Provider: PA [name], [place] Other Clinician: Referring Provider: Treating Provider/Extender: [name] in Treatment: 3 Debridement Performed for Assessment: Wound #3 Right,Lateral T Fifth oe Performed By: Physician [name]., PA-C Debridement Type: Debridement Level of Consciousness (Pre-procedure): Awake  and Alert Pre-procedure Verification/Time Out Yes - 12:48 Taken: T Area Debrided (L x W): otal 0.5 (cm) x 1 (cm) = 0.5 (cm) Tissue and other material debrided: Viable, Non-Viable, Slough, Subcutaneous, Slough Level: Skin/Subcutaneous Tissue Debridement Description: Excisional Instrument: Curette Bleeding: Minimum Hemostasis Achieved: Pressure Response to Treatment: Procedure was tolerated well Level of Consciousness (Post- Awake and Alert procedure): Post Debridement Measurements of Total Wound Length: (cm) 0.5 Width: (cm) 1 Depth: (cm) 0.1 Volume: (cm) 0.039 Character of Wound/Ulcer Post Debridement: Stable Post Procedure Diagnosis Same as Pre-procedure Electronic Signature(s) Signed: 03/10/2023 4:28:49 PM By: [name] Signed: 03/10/2023 6:19:02 PM By: Victoria Keeler PA-C Entered By: [name] on 03/10/2023 12:50:25 -------------------------------------------------------------------------------- Debridement Details Patient Name: Date of Service: [name], [place] 03/10/2023 12:00 PM Medical Record Number: [id number]:[id number] Patient Account  Number: [id number] Date of Birth/Sex: Treating RN: November 01, 1951 (72 y.o. [name] Primary Care Provider: PA [name], [place] Other Clinician: Referring Provider: Treating Provider/Extender: [name] in Treatment: 3 Debridement Performed for Assessment: Wound #2 Right,Lateral Foot [place], Melis ([place]:[id number]) [id number] Page 4 of 10 Performed By: Physician [name]., PA-C Debridement Type: Debridement Level of Consciousness (Pre-procedure): Awake and Alert Pre-procedure Verification/Time Out Yes - 12:50 Taken: Pain Control: Lidocaine 4% T opical Solution T Area Debrided (L x W): otal 0.6 (cm) x 1.7 (cm) = 1.02 (cm) Tissue and other material debrided: Viable, Non-Viable, Slough, Subcutaneous, Slough Level: Skin/Subcutaneous Tissue Debridement Description: Excisional Instrument: Curette Bleeding: Minimum Hemostasis Achieved: Pressure Response to Treatment: Procedure was tolerated well Level of Consciousness (Post- Awake and Alert procedure): Post Debridement Measurements of Total Wound Length: (cm) 1.6 Width: (cm) 1.7 Depth: (cm) 0.1 Volume: (cm) 0.214 Character of Wound/Ulcer Post Debridement: Stable Post Procedure Diagnosis Same as Pre-procedure Electronic Signature(s) Signed: 03/10/2023 4:28:49 PM By: [name] Signed: 03/10/2023 6:19:02 PM By: Victoria Keeler PA-C Entered By: [name] on 03/10/2023 12:52:26 -------------------------------------------------------------------------------- HPI Details Patient Name: Date of Service: [name], [place] 03/10/2023 12:00 PM Medical Record Number: [id number] Patient Account Number: [id number] Date of Birth/Sex: Treating RN: [birth year] (72 y.o. [name] Primary Care Provider: PA [place], [place] Other Clinician: Referring Provider: Treating Provider/Extender: [name] in Treatment: 3 History of Present Illness HPI Description:  02-16-2023 upon evaluation today patient presents for initial inspection here in our clinic concerning issues that she has been having with wounds over the right foot in the realm of several toes as well as the lateral portion of her foot fifth metatarsal location. Subsequently she unfortunately has somewhat poor arterial flow. I do have notes that I reviewed in epic and subsequently the patient did have an angiogram as well back in January. The testing following the angiogram revealed after stent placement on 01-09-2023 that she subsequently still had somewhat compromised arterial flow following with a right ABI of 0.70 with a TBI of 0.34 and on the left an ABI of 0.76 and the TBI could not be measured. 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 With that being said the follow-up as far as her arterial testing they did not do ABIs and TBI's but it showed that she was monophasic pretty much throughout and in general I think this still shows that there is a significant issue here and the vascular doctors seem to be in agreement as far as this is concerned they have recommended that she really needs to have repeat arterial angiogram with likely intervention to try to improve that monophasic flow. With that being said  the patient is somewhat reluctant to do this though I think that she is leaning towards it even though she had a lot of bleeding issues in the groin area following the last time she had this done. In regard to her wound she is actually having quite a bit of an issue here with these wounds specifically in the realm of getting them to turn around and heal appropriately. She tells me that the areas are looking much better than they were but nonetheless she still is noticing that the Betadine seems to be keeping things a little bit dry but some areas are starting to drain a lot more unfortunately. She is on aspirin and Plavix following the stent placement. This is probably part of the bleeding issues  that she had previously in the groin to be peripherally honest. She does have a history of COPD and coronary artery disease as well. 3/5; patient is in for her second clinic visit. She has significant PAD status post revascularization a month ago. According to the patient she is being scheduled for another 1 in a month's time. She has 4 wounds on the right foot lateral foot proximal foot fourth toe and the medial part of the right first toe [place], [name] ([place]:[id number]) [phone].pdf Page 5 of 10 3/12; patient presents for follow-up. She has been using Xeroform to the wound beds. She has no issues or complaints today. 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 03-10-2023 upon evaluation today patient presents for follow-up concerning her ongoing issues with her right lower extremity ulcers on foot. She has not seen vascular yet they called them but they have not actually gotten her scheduled. They state they are to call back and see if they can get things moving along. With that being said she does seem to be making some improvements with regard to her toes and I am very pleased in that regard I feel like the wounds are looking better today. Electronic Signature(s) Signed: 03/10/2023 1:01:54 PM By: Victoria Keeler PA-C Entered By: [name] on 03/10/2023 13:01:54 -------------------------------------------------------------------------------- Physical Exam Details Patient Name: Date of Service: [name], [name] 03/10/2023 12:00 PM Medical Record Number: [id number] Patient Account Number: [id number] Date of Birth/Sex: Treating RN: 13-Oct-1951 (72 y.o. [name] Primary Care Provider: PA [name], [place] Other Clinician: Referring Provider: Treating Provider/Extender: [name] in Treatment: 3 Constitutional Well-nourished and well-hydrated in no acute distress. Respiratory normal breathing without  difficulty. Psychiatric this patient is able to make decisions and demonstrates good insight into disease process. Alert and Oriented x 3. pleasant and cooperative. Notes Upon inspection patient's wound bed actually showed signs of the need for sharp debridement I did perform a light but thorough debridement of all wounds today which she tolerated without complication actually seems to have pretty decent flow I am very pleased in this regard. Fortunately I do not see any signs of infection locally nor systemically which is great news. Electronic Signature(s) Signed: 03/10/2023 1:02:17 PM By: Victoria Keeler PA-C Entered By: [name] on 03/10/2023 13:02:16 -------------------------------------------------------------------------------- Physician Orders Details Patient Name: Date of Service: [name], [place] 03/10/2023 12:00 PM Medical Record Number: [place]:[id number] Patient Account Number: [id number] Date of Birth/Sex: Treating RN: 05/10/1951 (72 y.o. 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 Bathing/ L[employer] wounds with antibacterial soap and water. Anesthetic (Use 'Patient Medications' Section for Anesthetic Order Entry) Lidocaine applied to wound bed Wound Treatment Wound #1 - T Great oe Wound Laterality: Right,  Lateral Cleanser: [place] Ancillary Kit - 15 Day Supply (Generic) 1 x Per Day/30 Days Discharge Instructions: Use supplies as instructed; Kit contains: (15) Saline Bullets; (15) 3x3 Gauze; 15 pr Gloves Cleanser: Soap and Water 1 x Per Day/30 Days Discharge Instructions: Gently cleanse wound with antibacterial soap, rinse and pat dry prior to dressing wounds Prim Dressing: Gauze 1 x Per Day/30 Days ary Discharge Instructions: As directed: dry Prim Dressing: Hydrofera Blue Ready Transfer Foam, 2.5x2.5 (in/in) 1 x Per Day/30 Days ary Discharge Instructions: Apply Hydrofera Blue Ready to wound bed as directed Secondary Dressing: Conforming Guaze Roll-Small 1 x Per Day/30 Days Discharge Instructions: Apply Conforming Stretch Guaze Bandage as directed Secured With: Medipore T - 9M Medipore H Soft Cloth Surgical T ape ape, 2x2 (in/yd) (Generic) 1 x Per Day/30 Days Wound #2 - Foot Wound Laterality: Right, Lateral Cleanser: Soap and Water 1 x Per Day/30 Days Discharge Instructions: Gently cleanse wound with antibacterial soap, rinse and pat dry prior to dressing wounds Prim Dressing: Xeroform-HBD 2x2 (in/in) (Generic) 1 x Per Day/30 Days ary Discharge Instructions: Apply Xeroform-HBD 2x2 (in/in) as directed Secondary Dressing: Conforming Guaze Roll-Small (Generic) 1 x Per Day/30 Days Discharge Instructions: Apply Conforming Stretch Guaze Bandage as directed Secured With: Medipore T - 9M Medipore H Soft Cloth Surgical T ape ape, 2x2 (in/yd) 1 x Per Day/30 Days Wound #3 - T Fifth oe Wound Laterality: Right, Lateral Cleanser: Soap and Water 1 x Per Day/30 Days Discharge Instructions: Gently cleanse wound with antibacterial soap, rinse and pat dry prior to dressing wounds Prim Dressing: Xeroform-HBD 2x2 (in/in) (Generic) 1 x Per Day/30 Days ary Discharge Instructions: Apply Xeroform-HBD 2x2 (in/in) as directed Secondary Dressing: Conforming Guaze Roll-Small (Generic) 1 x Per Day/30 Days Discharge  Instructions: Apply Conforming Stretch Guaze Bandage as directed Secured With: Medipore T - 9M Medipore H Soft Cloth Surgical T ape ape, 2x2 (in/yd) 1 x Per Day/30 Days Wound #4 - T Fourth oe Wound Laterality: Right, Lateral Cleanser: Soap and Water 1 x Per Day/30 Days Discharge Instructions: Gently cleanse wound with antibacterial soap, rinse and pat dry prior to dressing wounds Prim Dressing: Xeroform-HBD 2x2 (in/in) (Generic) 1 x Per Day/30 Days ary Discharge Instructions: Apply Xeroform-HBD 2x2 (in/in) as directed Secondary Dressing: Conforming Guaze Roll-Small (Generic) 1 x Per Day/30 Days Discharge Instructions: Apply Conforming Stretch Guaze Bandage as directed [name], [name] ([place]:[id number]) [id number] Page 7 of 10 Secured With: Medipore T - 9M Medipore H Soft Cloth Surgical T ape ape, 2x2 (in/yd) 1 x Per Day/30 Days Electronic Signature(s) Signed: 03/10/2023 4:28:49 PM By: [name] Signed: 03/10/2023 6:19:02 PM By: Victoria Keeler PA-C Entered By: [name] on 03/10/2023 13:12:01 -------------------------------------------------------------------------------- Problem List Details Patient Name: Date of Service: [name], [place] 03/10/2023 12:00 PM Medical Record Number: [id number] Patient Account Number: [id number] Date of Birth/Sex: Treating RN: 12-31-1950 (72 y.o. 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 02/16/2023 No Yes L97.512 Non-pressure chronic ulcer of other part of right foot with fat layer exposed 02/16/2023 No Yes Z79.01 Long term (current) use of anticoagulants 02/16/2023 No Yes J44.9 Chronic obstructive pulmonary disease, unspecified 02/16/2023 No Yes I25.10 Atherosclerotic heart disease of  native coronary artery without angina pectoris 02/16/2023 No Yes Inactive Problems Resolved Problems Electronic Signature(s) Signed: 03/10/2023 12:34:38 PM By: Victoria Keeler PA-C Entered By: [name] on 03/10/2023 12:34:37 [name] ([place]:[id number]) [id number] Page 8 of 10 -------------------------------------------------------------------------------- Progress Note Details Patient Name: Date of Service: [name], [name] 03/10/2023 12:00 PM Medical Record Number: [place]:[id number] Patient Account Number: [id number] Date of Birth/Sex: Treating RN: November 20, 1951 (72 y.o. [name] Primary Care Provider: PA [place], [place] Other Clinician: Referring Provider: Treating Provider/Extender: [name] in Treatment: 3 Subjective Chief Complaint Information obtained from Patient Right foot ulcers History of Present Illness (HPI) 02-16-2023 upon evaluation today patient presents for initial inspection here in our clinic concerning issues that she has been having with wounds over the right foot in the realm of several toes as well as the lateral portion of her foot fifth metatarsal location. Subsequently she unfortunately has somewhat poor arterial flow. I do have notes that I reviewed in epic and subsequently the patient did have an angiogram as well back in January. The testing following the angiogram revealed after stent placement on 01-09-2023 that she subsequently still had somewhat compromised arterial flow following with a right ABI of 0.70 with a TBI of 0.34 and on the left an ABI of 0.76 and the TBI could not be measured. 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 With that being said the follow-up as far as her arterial testing they did not do ABIs and TBI's but it showed that she was monophasic pretty much throughout and in general I think this still shows that there is a significant issue here and the vascular doctors seem to be in agreement as  far as this is concerned they have recommended that she really needs to have repeat arterial angiogram with likely intervention to try to improve that monophasic flow. With that being said the patient is somewhat reluctant to do this though I think that she is leaning towards it even though she had a lot of bleeding issues in the groin area following the last time she had this done. In regard to her wound she is actually having quite a bit of an issue here with these wounds specifically in the realm of getting them to turn around and heal appropriately. She tells me that the areas are looking much better than they were but nonetheless she still is noticing that the Betadine seems to be keeping things a little bit dry but some areas are starting to drain a lot more unfortunately. She is on aspirin and Plavix following the stent placement. This is probably part of the bleeding issues that she had previously in the groin to be peripherally honest. She does have a history of COPD and coronary artery disease as well. 3/5; patient is in for her second clinic visit. She has significant PAD status post revascularization a month ago. According to the patient she is being scheduled for another 1 in a month's time. She has 4 wounds on the right foot lateral foot proximal foot fourth toe and the medial part of the right first toe 3/12; patient presents for follow-up. She has  been using Xeroform to the wound beds. She has no issues or complaints today. 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 03-10-2023 upon evaluation today patient presents for follow-up concerning her ongoing issues with her right lower extremity ulcers on foot. She has not seen vascular yet they called them but they have not actually gotten her scheduled. They state they are to call back and see if they can get things moving along. With that being said she does seem to be making some improvements with regard  to her toes and I am very pleased in that regard I feel like the wounds are looking better today. Objective Constitutional Well-nourished and well-hydrated in no acute distress. Vitals Time Taken: 12:09 PM, Height: 62 in, Weight: 162 lbs, BMI: 29.6, Temperature: 97.6 F, Pulse: 82 bpm, Respiratory Rate: 18 breaths/min, Blood Pressure: 125/75 mmHg. Respiratory normal breathing without difficulty. Psychiatric this patient is able to make decisions and demonstrates good insight into disease process. Alert and Oriented x 3. pleasant and cooperative. General Notes: Upon inspection patient's wound bed actually showed signs of the need for sharp debridement I did perform a light but thorough debridement of all wounds today which she tolerated without complication actually seems to have pretty decent flow I am very pleased in this regard. Fortunately I do not see any signs of infection locally nor systemically which is great news. Integumentary (Hair, Skin) [name], [name] ([id number]) [id number] Page 9 of 10 Wound #1 status is Open. Original cause of wound was Gradually Appeared. The date acquired was: 12/29/2022. The wound has been in treatment 3 weeks. The wound is located on the Right,Lateral T Great. The wound measures 4cm length x 1.7cm width x 0.1cm depth; 5.341cm^2 area and 0.534cm^3 volume. There oe is Fat Layer (Subcutaneous Tissue) exposed. There is no tunneling or undermining noted. There is a medium amount of serosanguineous drainage noted. There is medium (34-66%) red, pink granulation within the wound bed. There is a medium (34-66%) amount of necrotic tissue within the wound bed including Eschar. Wound #2 status is Open. Original cause of wound was Pressure Injury. The date acquired was: 12/29/2022. The wound has been in treatment 3 weeks. The wound is located on the Right,Lateral Foot. The wound measures 1.6cm length x 1.7cm width x 0.1cm depth; 2.136cm^2 area and  0.214cm^3 volume. There is Fat Layer (Subcutaneous Tissue) exposed. There is no tunneling or undermining noted. There is a medium amount of serosanguineous drainage noted. There is medium (34-66%) red, pink granulation within the wound bed. There is a medium (34-66%) amount of necrotic tissue within the wound bed including Adherent Slough. Wound #3 status is Open. Original cause of wound was Pressure Injury. The date acquired was: 12/29/2022. The wound has been in treatment 3 weeks. The wound is located on the Right,Lateral T Fifth. The wound measures 0.5cm length x 1cm width x 0.1cm depth; 0.393cm^2 area and 0.039cm^3 volume. There oe is no tunneling or undermining noted. There is a medium amount of serosanguineous drainage noted. There is no granulation within the wound bed. There is a large (67-100%) amount of necrotic tissue within the wound bed including Eschar. Wound #4 status is Open. Original cause of wound was Pressure Injury. The date acquired was: 12/29/2022. The wound has been in treatment 3 weeks. The wound is located on the Right,Lateral T Fourth. The wound measures 0.1cm length x 0.2cm width x 0.1cm depth; 0.016cm^2 area and 0.002cm^3 volume. oe There is Fat Layer (Subcutaneous Tissue) exposed. There is  no tunneling or undermining noted. There is a medium amount of serosanguineous drainage noted. There is small (1-33%) red, pink granulation within the wound bed. There is a large (67-100%) amount of necrotic tissue within the wound bed including Eschar. 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 There was a Excisional Skin/Subcutaneous oe Tissue Debridement with a total area of 6.8 sq cm performed by [name]., PA-C. With the following instrument(s): Curette to remove Viable and Non- Viable tissue/material. Material removed includes Subcutaneous Tissue and Slough and after achieving pain control using Lidocaine 4% Topical Solution. No specimens were taken. A time out was conducted at 12:42, prior to the start of the procedure. A Minimum amount of bleeding was controlled with Pressure. The procedure was tolerated well. Post Debridement Measurements: 4cm length x 1.7cm width x 0.1cm depth; 0.534cm^3 volume. Character of Wound/Ulcer Post Debridement is stable. Post procedure Diagnosis Wound #1: Same as Pre-Procedure Wound #2 Pre-procedure diagnosis of Wound #2 is a Neuropathic Ulcer-Non Diabetic located on the Right,Lateral Foot . There was a Excisional Skin/Subcutaneous Tissue Debridement with a total area of 1.02 sq cm performed by [name]., PA-C. With the following instrument(s): Curette to remove Viable and Non-Viable tissue/material. Material removed includes Subcutaneous Tissue and Slough and after achieving pain control using Lidocaine 4% T opical Solution. No specimens were taken. A time out was conducted at 12:50, prior to the start of the procedure. A Minimum amount of bleeding was controlled with Pressure. The procedure was tolerated well. Post Debridement Measurements: 1.6cm length x 1.7cm width x 0.1cm depth; 0.214cm^3 volume. Character of Wound/Ulcer Post Debridement is stable. Post procedure Diagnosis Wound #2: Same as Pre-Procedure Wound #3 Pre-procedure diagnosis of Wound #3 is a Neuropathic Ulcer-Non Diabetic located on the Right,Lateral T Fifth . There was a Excisional Skin/Subcutaneous oe Tissue Debridement with a total area of 0.5 sq cm performed by [name]., PA-C. With the following instrument(s): Curette to remove Viable and  Non- Viable tissue/material. Material removed includes Subcutaneous Tissue and Slough and. No specimens were taken. A time out was conducted at 12:48, prior to the start of the procedure. A Minimum amount of bleeding was controlled with Pressure. The procedure was tolerated well. Post Debridement Measurements: 0.5cm length x 1cm width x 0.1cm depth; 0.039cm^3 volume. Character of Wound/Ulcer Post Debridement is stable. Post procedure Diagnosis Wound #3: Same as Pre-Procedure Wound #4 Pre-procedure diagnosis of Wound #4 is a Neuropathic Ulcer-Non Diabetic located on the Right,Lateral T Fourth . There was a Excisional Skin/Subcutaneous oe Tissue Debridement with a total area of 0.02 sq cm performed by [name]., PA-C. With the following instrument(s): Curette to remove Viable and Non- Viable tissue/material. Material removed includes Subcutaneous Tissue and Slough and after achieving pain control using Lidocaine 4% Topical Solution. No specimens were taken. A time out was conducted at 12:46, prior to the start of the procedure. A Minimum amount of bleeding was controlled with Pressure. The procedure was tolerated well. Post Debridement Measurements: 0.1cm length x 0.2cm width x 0.1cm depth; 0.002cm^3 volume. Character of Wound/Ulcer Post Debridement is stable. Post procedure Diagnosis Wound #4: Same as Pre-Procedure Plan [name], [name] ([id number]) [id number] Page 10  of 10 1. I am recommend currently that we have the patient going continue to monitor for any signs of infection or worsening. Obviously if anything changes she knows contact the office let me know. 2. I am going to recommend that we switch to [facility] for the great toe for the remainder areas we will continue with the Xeroform gauze which is doing a good job. We will see patient back for reevaluation in 1 week here in the clinic. If anything worsens or changes patient will contact our office for  additional recommendations. She is going to go ahead and get in touch with vascular about scheduling for the angiogram repeat. Electronic Signature(s) Signed: 03/10/2023 1:02:57 PM By: Victoria Keeler PA-C Entered By: [name] on 03/10/2023 13:02:56 -------------------------------------------------------------------------------- SuperBill Details Patient Name: Date of Service: [name], Lane 03/10/2023 Medical Record Number: [id number] Patient Account Number: [id number] Date of Birth/Sex: Treating RN: Jun 16, 1951 (72 y.o. [name] Primary Care Provider: PA [name], [place] Other Clinician: Referring Provider: Treating Provider/Extender: [name] in Treatment: 3 Diagnosis Coding ICD-10 Codes Code Description G60.3 Idiopathic progressive neuropathy L97.512 Non-pressure chronic ulcer of other part of right foot with fat layer exposed Z79.01 Long term (current) use of anticoagulants J44.9 Chronic obstructive pulmonary disease, unspecified I25.10 Atherosclerotic heart disease of native coronary artery without angina pectoris Facility Procedures : CPT4 Code: [id number] Description: [id number] - DEB SUBQ TISSUE 20 SQ CM/< ICD-10 Diagnosis Description L97.512 Non-pressure chronic ulcer of other part of right foot with fat layer exposed Modifier: Quantity: 1 Physician Procedures : CPT4 Code Description Modifier [id number] - WC PHYS SUBQ TISS 20 SQ CM ICD-10 Diagnosis Description L97.512 Non-pressure chronic ulcer of other part of right foot with fat layer exposed Quantity: 1 Electronic Signature(s) Signed: 03/10/2023 4:28:49 PM By: [name] Signed: 03/10/2023 6:19:02 PM By: Victoria Keeler PA-C Previous Signature: 03/10/2023 1:03:08 PM Version By: Victoria Keeler PA-C Entered By: [name] on 03/10/2023 13:12:13

## 2023-03-11 NOTE — Progress Notes (Signed)
[place], [name] ([place]:[id number]) [id number] Page 1 of 13 Visit Report for 03/10/2023 Arrival Information Details Patient Name: Date of Service: OTHELL, FORNASH 03/10/2023 12:00 PM Medical Record Number: [id number] Patient Account Number: [id number] Date of Birth/Sex: Treating RN: June 26, 1951 (71 y.o. [name] Primary Care Shlomie [name]: PA Haig [place], [place] Other Clinician: Referring Tangy Drozdowski: Treating Aydien Majette/Extender: Luiz Ochoa in Treatment: 3 Visit Information History Since Last Visit Added or deleted any medications: No Patient Arrived: Wheel Chair Any new allergies or adverse reactions: No Arrival Time: 12:04 Had a fall or experienced change in No Accompanied By: daughter activities of daily living that may affect Transfer Assistance: EasyPivot Patient Lift risk of falls: Patient Identification Verified: Yes Hospitalized since last visit: No Secondary Verification Process Completed: Yes Has Dressing in Place as Prescribed: Yes Patient Requires Transmission-Based Precautions: No Pain Present Now: Yes Patient Has Alerts: Yes Patient Alerts: Patient on Blood Thinner PLAVIX ASA 81 mg R ABI 0.70 TBI 0.34 L ABI 0.76 TBI 0.00 Electronic Signature(s) Signed: 03/10/2023 4:28:49 PM By: Levora Dredge Entered By: Levora Dredge on 03/10/2023 12:09:25 -------------------------------------------------------------------------------- Clinic Level of Care Assessment Details Patient Name: Date of Service: ANIELKA, KAMINSKAS 03/10/2023 12:00 PM Medical Record Number: [id number] Patient Account Number: [id number] Date of Birth/Sex: Treating RN: 1951-10-28 (72 y.o. [name] Primary Care Aleksey Newbern: PA Haig Prophet, [place] Other Clinician: Referring Gelene Recktenwald: Treating Mora Pedraza/Extender: Luiz Ochoa in Treatment: 3 Clinic Level of Care Assessment Items TOOL 1 Quantity Score []  - 0 Use when EandM and Procedure is performed on INITIAL  visit ASSESSMENTS - Nursing Assessment / Reassessment []  - 0 General Physical Exam (combine w/ comprehensive assessment (listed just below) when performed on new pt. evals) []  - 0 Comprehensive Assessment (HX, ROS, Risk Assessments, Wounds Hx, etc.) Menz, Lolitha (:[id number]) [phone].pdf Page 2 of 13 ASSESSMENTS - Wound and Skin Assessment / Reassessment []  - 0 Dermatologic / Skin Assessment (not related to wound area) ASSESSMENTS - Ostomy and/or Continence Assessment and Care []  - 0 Incontinence Assessment and Management []  - 0 Ostomy Care Assessment and Management (repouching, etc.) PROCESS - Coordination of Care []  - 0 Simple Patient / Family Education for ongoing care []  - 0 Complex (extensive) Patient / Family Education for ongoing care []  - 0 Staff obtains Programmer, systems, Records, T Results / Process Orders est []  - 0 Staff telephones HHA, Nursing Homes / Clarify orders / etc []  - 0 Routine Transfer to another Facility (non-emergent condition) []  - 0 Routine Hospital Admission (non-emergent condition) []  - 0 New Admissions / Biomedical engineer / Ordering NPWT Apligraf, etc. , []  - 0 Emergency Hospital Admission (emergent condition) PROCESS - Special Needs []  - 0 Pediatric / Minor Patient Management []  - 0 Isolation Patient Management []  - 0 Hearing / Language / Visual special needs []  - 0 Assessment of Community assistance (transportation, D/C planning, etc.) []  - 0 Additional assistance / Altered mentation []  - 0 Support Surface(s) Assessment (bed, cushion, seat, etc.) INTERVENTIONS - Miscellaneous []  - 0 External ear exam []  - 0 Patient Transfer (multiple staff / Civil Service fast streamer / Similar devices) []  - 0 Simple Staple / Suture removal (25 or less) []  - 0 Complex Staple / Suture removal (26 or more) []  - 0 Hypo/Hyperglycemic Management (do not check if billed separately) []  - 0 Ankle / Brachial Index (ABI) - do not check if billed  separately Has the patient been seen at the hospital within the last three years: Yes Total Score: 0 Level Of Care:  ____ Electronic Signature(s) Signed: 03/10/2023 4:28:49 PM By: Levora Dredge Entered By: Levora Dredge on 03/10/2023 13:12:07 -------------------------------------------------------------------------------- Encounter Discharge Information Details Patient Name: Date of Service: [name], [place] 03/10/2023 12:00 PM Medical Record Number: [id number] Patient Account Number: [id number] Date of Birth/Sex: Treating RN: 03/01/1951 (72 y.o. [name] Primary Care Amilia Vandenbrink: PA Haig Prophet, [place] Other Clinician: Referring Icesis Renn: Treating Keirstan Iannello/Extender: Luiz Ochoa in Treatment: [place], [place] ([place]:[id number]) [id number] Page 3 of 13 Encounter Discharge Information Items Post Procedure Vitals Discharge Condition: Stable Temperature (F): 97.6 Ambulatory Status: Wheelchair Pulse (bpm): 82 Discharge Destination: Home Respiratory Rate (breaths/min): 18 Transportation: Private Auto Blood Pressure (mmHg): 128/75 Accompanied By: daughter Schedule Follow-up Appointment: Yes Clinical Summary of Care: Electronic Signature(s) Signed: 03/10/2023 4:28:49 PM By: Levora Dredge Entered By: Levora Dredge on 03/10/2023 13:13:27 -------------------------------------------------------------------------------- Lower Extremity Assessment Details Patient Name: Date of Service: EVALY, GINGER 03/10/2023 12:00 PM Medical Record Number: [id number] Patient Account Number: [id number] Date of Birth/Sex: Treating RN: 1951/11/03 (72 y.o. [name] Primary Care Foy Vanduyne: PA Haig Prophet, [place] Other Clinician: Referring Adalyn Pennock: Treating Ithiel Liebler/Extender: Luiz Ochoa in Treatment: 3 Electronic Signature(s) Signed: 03/10/2023 4:28:49 PM By: Levora Dredge Entered By: Levora Dredge on 03/10/2023  12:25:07 -------------------------------------------------------------------------------- Multi Wound Chart Details Patient Name: Date of Service: [name], [place] 03/10/2023 12:00 PM Medical Record Number: [id number] Patient Account Number: [id number] Date of Birth/Sex: Treating RN: Aug 17, 1951 (72 y.o. 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 Etiology: Asthma, Chronic Obstructive Asthma, Chronic Obstructive Asthma, Chronic Obstructive Comorbid History: Pulmonary Disease (COPD), Coronary Pulmonary Disease (COPD), Coronary Pulmonary Disease (COPD), Coronary Artery Disease Artery Disease Artery Disease 12/29/2022 12/29/2022 12/29/2022 Date Acquired: 3 3 3  Weeks of Treatment: Open Open Open Wound Status: No No No Wound Recurrence: Yes Yes Yes Pending A mputation on Presentation: 4x1.7x0.1 1.6x1.7x0.1 0.5x1x0.1 Measurements L x W x D (cm) 5.341 2.136 0.393 A (cm) : rea 0.534 0.214 0.039 Volume (cm) : -0.80% 9.30% 86.70% % Reduction in A rea: 49.60% 54.60% 86.80% % Reduction in Volume: Full Thickness Without Exposed Full Thickness Without Exposed Unclassifiable Classification: Support Structures Support Structures Medium Medium Medium Exudate A mount: Serosanguineous Serosanguineous Serosanguineous Exudate Type: red, brown red, brown red, brown Exudate  Color: Medium (34-66%) Medium (34-66%) None Present (0%) Granulation Amount: Red, Pink Red, Pink N/A Granulation Quality: Medium (34-66%) Medium (34-66%) Large (67-100%) Necrotic Amount: Eschar Adherent Slough Eschar Necrotic Tissue: Fat Layer (Subcutaneous Tissue): Yes Fat Layer (Subcutaneous Tissue): Yes Fascia: No Exposed Structures: Fascia: No Fascia: No Fat Layer (Subcutaneous Tissue): No Tendon: No Tendon: No Tendon: No Muscle: No Muscle: No Muscle: No Joint: No Joint: No Joint: No Bone: No Bone: No Bone: No None None None Epithelialization: Wound Number: 4 N/A N/A Photos: N/A N/A Right, Lateral T Fourth oe N/A N/A Wound Location: Pressure Injury N/A N/A Wounding Event: Neuropathic Ulcer-Non Diabetic N/A N/A Primary Etiology: Asthma, Chronic Obstructive N/A N/A Comorbid History: Pulmonary Disease (COPD), Coronary Artery Disease 12/29/2022 N/A N/A Date Acquired: 3 N/A N/A Weeks of Treatment: Open N/A N/A Wound Status: No N/A N/A  Wound Recurrence: Yes N/A N/A Pending A mputation on Presentation: 0.1x0.2x0.1 N/A N/A Measurements L x W x D (cm) 0.016 N/A N/A A (cm) : rea 0.002 N/A N/A Volume (cm) : 97.10% N/A N/A % Reduction in A rea: 96.40% N/A N/A % Reduction in Volume: Partial Thickness N/A N/A Classification: Medium N/A N/A Exudate A mount: Serosanguineous N/A N/A Exudate Type: red, brown N/A N/A Exudate Color: Small (1-33%) N/A N/A Granulation A mount: Red, Pink N/A N/A Granulation Quality: Large (67-100%) N/A N/A Necrotic A mount: Eschar N/A N/A Necrotic Tissue: Fat Layer (Subcutaneous Tissue): Yes N/A N/A Exposed Structures: Fascia: No Tendon: No Muscle: No Joint: No Bone: No None N/A N/A EpithelializationGlee Hiniker, [name] ([id number].pdf Page 5 of 13 Treatment Notes Electronic Signature(s) Signed: 03/10/2023 4:28:49 PM By: Levora Dredge Entered By: Levora Dredge on 03/10/2023  12:39:51 -------------------------------------------------------------------------------- Multi-Disciplinary Care Plan Details Patient Name: Date of Service: [name], [place] 03/10/2023 12:00 PM Medical Record Number: [id number] Patient Account Number: [id number] Date of Birth/Sex: Treating RN: 05-May-1951 (72 y.o. [name] Primary Care Petra Sargeant: PA Haig Prophet, [place] Other Clinician: Referring Neelah Mannings: Treating Hasson Gaspard/Extender: Luiz Ochoa in Treatment: 3 Active Inactive Necrotic Tissue Nursing Diagnoses: Impaired tissue integrity related to necrotic/devitalized tissue Knowledge deficit related to management of necrotic/devitalized tissue Goals: Necrotic/devitalized tissue will be minimized in the wound bed Date Initiated: 02/16/2023 Target Resolution Date: 03/17/2023 Goal Status: Active Patient/caregiver will verbalize understanding of reason and process for debridement of necrotic tissue Date Initiated: 02/16/2023 Target Resolution Date: 03/17/2023 Goal Status: Active Interventions: Assess patient pain level pre-, during and post procedure and prior to discharge Provide education on necrotic tissue and debridement process Treatment Activities: Apply topical anesthetic as ordered : 02/16/2023 Excisional debridement : 02/16/2023 Notes: Wound/Skin Impairment Nursing Diagnoses: Impaired tissue integrity Knowledge deficit related to ulceration/compromised skin integrity Goals: Patient will have a decrease in wound volume by X% from date: (specify in notes) Date Initiated: 02/16/2023 Target Resolution Date: 03/17/2023 Goal Status: Active Patient/caregiver will verbalize understanding of skin care regimen Date Initiated: 02/16/2023 Date Inactivated: 03/03/2023 Target Resolution Date: 03/17/2023 Goal Status: Met Ulcer/skin breakdown will have a volume reduction of 30% by week 4 Date Initiated: 02/16/2023 Target Resolution Date: 03/17/2023 Goal Status:  Active Ulcer/skin breakdown will have a volume reduction of 50% by week [place], Chayah ([id number]) [phone].pdf Page 6 of 13 Date Initiated: 02/16/2023 Target Resolution Date: 04/17/2023 Goal Status: Active Ulcer/skin breakdown will have a volume reduction of 80% by week 12 Date Initiated: 02/16/2023 Target Resolution Date: 05/17/2023 Goal Status: Active Interventions: Assess patient/caregiver ability to obtain necessary supplies Assess patient/caregiver ability to perform ulcer/skin care regimen upon admission and as needed Assess ulceration(s) every visit Provide education on ulcer and skin care Treatment Activities: Referred to DME Vernon Ariel for dressing supplies : 02/16/2023 Skin care regimen initiated : 02/16/2023 Notes: Electronic Signature(s) Signed: 03/10/2023 4:28:49 PM By: Levora Dredge Entered By: Levora Dredge on 03/10/2023 13:12:23 -------------------------------------------------------------------------------- Pain Assessment Details Patient Name: Date of Service: [name], [place] 03/10/2023 12:00 PM Medical Record Number: [id number] Patient Account Number: [id number] Date of Birth/Sex: Treating RN: [birth year] (72 y.o. [name] Primary Care Valon Glasscock: PA Haig Prophet, [place] Other Clinician: Referring Jiyan Walkowski: Treating Isella Slatten/Extender: Luiz Ochoa in Treatment: 3 Active Problems Location of Pain Severity and Description of Pain Patient Has Paino Yes Site Locations Rate the pain. Current Pain Level: 2 Pain Management and Medication Current Pain Management: Notes pt states pain in great toe right foot CRAIGE, Dajiah ([id number]) [id number].pdf Page  7 of 13 Electronic Signature(s) Signed: 03/10/2023 4:28:49 PM By: Levora Dredge Entered By: Levora Dredge on 03/10/2023 12:10:06 -------------------------------------------------------------------------------- Patient/Caregiver Education  Details Patient Name: Date of Service: [name], [place] 3/19/2024andnbsp12:00 PM Medical Record Number: [id number] Patient Account Number: [id number] Date of Birth/Gender: Treating RN: 1951/10/09 (72 y.o. [name] Primary Care Physician: PA Haig Prophet, [place] Other Clinician: Referring Physician: Treating Physician/Extender: Luiz Ochoa in Treatment: 3 Education Assessment Education Provided To: Patient Education Topics Provided Wound Debridement: Handouts: Wound Debridement Methods: Explain/Verbal Responses: State content correctly Wound/Skin Impairment: Handouts: Caring for Your Ulcer Methods: Explain/Verbal Responses: State content correctly Electronic Signature(s) Signed: 03/10/2023 4:28:49 PM By: Levora Dredge Entered By: Levora Dredge on 03/10/2023 13:12:37 -------------------------------------------------------------------------------- Wound Assessment Details Patient Name: Date of Service: [name], [place] 03/10/2023 12:00 PM Medical Record Number: [id number] Patient Account Number: [id number] Date of Birth/Sex: Treating RN: 11-Feb-1951 (72 y.o. [name] Primary Care Bret Stamour: PA Haig Prophet, [place] Other Clinician: Referring Janijah Symons: Treating Elmond Poehlman/Extender: Luiz Ochoa in Treatment: 3 Wound Status Wound Number: 1 Primary Neuropathic Ulcer-Non Diabetic Etiology: WAKEELAH, BRYNER ([place]:[id number]) [phone].pdf Page 8 of 13 Etiology: Wound Location: Right, Lateral T Great oe Wound Open Wounding Event: Gradually Appeared Status: Date Acquired: 12/29/2022 Comorbid Asthma, Chronic Obstructive Pulmonary Disease (COPD), Weeks Of Treatment: 3 History: Coronary Artery Disease Clustered Wound: No Pending Amputation On Presentation Photos Wound Measurements Length: (cm) 4 Width: (cm) 1.7 Depth: (cm) 0.1 Area: (cm) 5.341 Volume: (cm) 0.534 % Reduction in Area: -0.8% % Reduction in Volume:  49.6% Epithelialization: None Tunneling: No Undermining: No Wound Description Classification: Full Thickness Without Exposed Suppor Exudate Amount: Medium Exudate Type: Serosanguineous Exudate Color: red, brown t Structures Foul Odor After Cleansing: No Slough/Fibrino Yes Wound Bed Granulation Amount: Medium (34-66%) Exposed Structure Granulation Quality: Red, Pink Fascia Exposed: No Necrotic Amount: Medium (34-66%) Fat Layer (Subcutaneous Tissue) Exposed: Yes Necrotic Quality: Eschar Tendon Exposed: No Muscle Exposed: No Joint Exposed: No Bone Exposed: No Treatment Notes Wound #1 (Toe Great) Wound Laterality: Right, Lateral Cleanser [place] Ancillary Kit - 15 Day Supply Discharge Instruction: Use supplies as instructed; Kit contains: (15) Saline Bullets; (15) 3x3 Gauze; 15 pr Gloves Soap and Water Discharge Instruction: Gently cleanse wound with antibacterial soap, rinse and pat dry prior to dressing wounds Peri-Wound Care Topical Primary Dressing Gauze Discharge Instruction: As directed: dry Hydrofera Blue Ready Transfer Foam, 2.5x2.5 (in/in) Discharge Instruction: Apply Hydrofera Blue Ready to wound bed as directed Secondary Dressing Conforming [place] Roll-Small Discharge Instruction: [place] as directed Secured With Medipore T - 49M Medipore H Soft Cloth Surgical T ape ape, 2x2 (in/yd) Compression Wrap Compression Stockings Add-Ons [place], [name] ([place]:[id number]) [phone].pdf Page 9 of 13 Electronic Signature(s) Signed: 03/10/2023 4:28:49 PM By: Levora Dredge Entered By: Levora Dredge on 03/10/2023 12:22:43 -------------------------------------------------------------------------------- Wound Assessment Details Patient Name: Date of Service: SHAIANN, HEINISCH 03/10/2023 12:00 PM Medical Record Number: [id number] Patient Account Number: [id number] Date of Birth/Sex: Treating RN: October 27, 1951 (72 y.o. [name] Primary Care Ireanna Finlayson: PA [place], [place] Other Clinician: Referring Terik Haughey: Treating Aaiden Depoy/Extender: Luiz Ochoa in Treatment: 3 Wound Status Wound Number: 2 Primary Neuropathic Ulcer-Non Diabetic Etiology: Wound Location: Right, Lateral Foot Wound Open Wounding Event: Pressure Injury Status: Date Acquired: 12/29/2022 Comorbid Asthma, Chronic Obstructive Pulmonary Disease (COPD), Weeks Of Treatment: 3 History: Coronary Artery Disease Clustered Wound: No Pending Amputation On Presentation Photos Wound Measurements Length: (cm) 1.6 Width: (cm) 1.7 Depth: (cm) 0.1 Area: (cm) 2.136 Volume: (cm) 0.214 % Reduction  in Area: 9.3% % Reduction in Volume: 54.6% Epithelialization: None Tunneling: No Undermining: No Wound Description Classification: Full Thickness Without Exposed Suppor Exudate Amount: Medium Exudate Type: Serosanguineous Exudate Color: red, brown t Structures Foul Odor After Cleansing: No Slough/Fibrino Yes Wound Bed Granulation Amount: Medium (34-66%) Exposed Structure Granulation Quality: Red, Pink Fascia Exposed: No Necrotic Amount: Medium (34-66%) Fat Layer (Subcutaneous Tissue) Exposed: Yes Necrotic Quality: Adherent Slough Tendon Exposed: No Muscle Exposed: No Joint Exposed: No Bone Exposed: No [place], [place] ([place]:[id number].pdf Page 10 of 13 Treatment Notes Wound #2 (Foot) Wound Laterality: Right, Lateral Cleanser Soap and Water Discharge Instruction: Gently cleanse wound with antibacterial soap, rinse and pat dry prior to dressing wounds Peri-Wound Care Topical Primary Dressing Xeroform-HBD 2x2 (in/in) Discharge Instruction: Apply Xeroform-HBD 2x2 (in/in) as directed Secondary Dressing Conforming [place] Roll-Small Discharge Instruction: [place] as directed Secured With [place] H Soft Cloth Surgical T ape ape, 2x2 (in/yd) Compression  Wrap Compression Stockings Add-Ons Electronic Signature(s) Signed: 03/10/2023 4:28:49 PM By: Levora Dredge Entered By: Levora Dredge on 03/10/2023 12:23:41 -------------------------------------------------------------------------------- Wound Assessment Details Patient Name: Date of Service: [name], [place] 03/10/2023 12:00 PM Medical Record Number: [id number] Patient Account Number: [id number] Date of Birth/Sex: Treating RN: [birth year] (72 y.o. [name] Primary Care Geovanni Rahming: PA Haig Prophet, [place] Other Clinician: Referring Jaqua Ching: Treating Sangeeta Youse/Extender: Luiz Ochoa in Treatment: 3 Wound Status Wound Number: 3 Primary Neuropathic Ulcer-Non Diabetic Etiology: Wound Location: Right, Lateral T Fifth oe Wound Open Wounding Event: Pressure Injury Status: Date Acquired: 12/29/2022 Comorbid Asthma, Chronic Obstructive Pulmonary Disease (COPD), Weeks Of Treatment: 3 History: Coronary Artery Disease Clustered Wound: No Pending Amputation On Presentation Photos [place], Charisse ([place]:[id number]) [id number] Page 11 of 13 Wound Measurements Length: (cm) 0.5 Width: (cm) 1 Depth: (cm) 0.1 Area: (cm) 0.393 Volume: (cm) 0.039 % Reduction in Area: 86.7% % Reduction in Volume: 86.8% Epithelialization: None Tunneling: No Undermining: No Wound Description Classification: Unclassifiable Exudate Amount: Medium Exudate Type: Serosanguineous Exudate Color: red, brown Foul Odor After Cleansing: No Slough/Fibrino Yes Wound Bed Granulation Amount: None Present (0%) Exposed Structure Necrotic Amount: Large (67-100%) Fascia Exposed: No Necrotic Quality: Eschar Fat Layer (Subcutaneous Tissue) Exposed: No Tendon Exposed: No Muscle Exposed: No Joint Exposed: No Bone Exposed: No Treatment Notes Wound #3 (Toe Fifth) Wound Laterality: Right, Lateral Cleanser Soap and Water Discharge Instruction: Gently cleanse wound with antibacterial  soap, rinse and pat dry prior to dressing wounds Peri-Wound Care Topical Primary Dressing Xeroform-HBD 2x2 (in/in) Discharge Instruction: Apply Xeroform-HBD 2x2 (in/in) as directed Secondary Dressing [place] Roll-Small Discharge Instruction: [place] as directed Secured With Medipore T - 84M Medipore H Soft Cloth Surgical T ape ape, 2x2 (in/yd) Compression Wrap Compression Stockings Add-Ons Electronic Signature(s) Signed: 03/10/2023 4:28:49 PM By: Levora Dredge Entered By: Levora Dredge on 03/10/2023 12:24:19 [name] ([place]:[id number]) [id number] Page 12 of 13 -------------------------------------------------------------------------------- Wound Assessment Details Patient Name: Date of Service: TICEY, COTTLE 03/10/2023 12:00 PM Medical Record Number: [id number] Patient Account Number: [id number] Date of Birth/Sex: Treating RN: 1951-12-05 (72 y.o. [name] Primary Care Mable Lashley: PA Haig Prophet, [place] Other Clinician: Referring Blanton Kardell: Treating Segundo Makela/Extender: Luiz Ochoa in Treatment: 3 Wound Status Wound Number: 4 Primary Neuropathic Ulcer-Non Diabetic Etiology: Wound Location: Right, Lateral T Fourth oe Wound Open Wounding Event: Pressure Injury Status: Date Acquired: 12/29/2022 Comorbid Asthma, Chronic Obstructive Pulmonary Disease (COPD), Weeks Of Treatment: 3 History: Coronary Artery Disease Clustered Wound: No Pending Amputation On Presentation Photos  Wound Measurements Length: (cm) 0.1 Width: (cm) 0.2 Depth: (cm) 0.1 Area: (cm) 0.016 Volume: (cm) 0.002 % Reduction in Area: 97.1% % Reduction in Volume: 96.4% Epithelialization: None Tunneling: No Undermining: No Wound Description Classification: Partial Thickness Exudate Amount: Medium Exudate Type: Serosanguineous Exudate Color: red, brown Foul Odor After Cleansing: No Slough/Fibrino Yes Wound  Bed Granulation Amount: Small (1-33%) Exposed Structure Granulation Quality: Red, Pink Fascia Exposed: No Necrotic Amount: Large (67-100%) Fat Layer (Subcutaneous Tissue) Exposed: Yes Necrotic Quality: Eschar Tendon Exposed: No Muscle Exposed: No Joint Exposed: No Bone Exposed: No Treatment Notes Wound #4 (Toe Fourth) Wound Laterality: Right, Lateral Cleanser Soap and Water Discharge Instruction: Gently cleanse wound with antibacterial soap, rinse and pat dry prior to dressing wounds Moncayo, Maretta ([id number].pdf Page 13 of 13 Peri-Wound Care Topical Primary Dressing Xeroform-HBD 2x2 (in/in) Discharge Instruction: Apply Xeroform-HBD 2x2 (in/in) as directed Secondary Dressing Conforming [place] Roll-Small Discharge Instruction: Apply Conforming Stretch Guaze Bandage as directed Secured With [place] H Soft Cloth Surgical T ape ape, 2x2 (in/yd) Compression Wrap Compression Stockings Add-Ons Electronic Signature(s) Signed: 03/10/2023 4:28:49 PM By: Levora Dredge Entered By: Levora Dredge on 03/10/2023 12:24:59 -------------------------------------------------------------------------------- Vitals Details Patient Name: Date of Service: [name], [place] 03/10/2023 12:00 PM Medical Record Number: [place]:[id number] Patient Account Number: [id number] Date of Birth/Sex: Treating RN: 27-Aug-1951 (72 y.o. [name] Primary Care Plumer [place]edt: PA [place], [place] Other Clinician: Referring Mandy Peeks: Treating Caeson Filippi/Extender: Luiz Ochoa in Treatment: 3 Vital Signs Time Taken: 12:09 Temperature (F): 97.6 Height (in): 62 Pulse (bpm): 82 Weight (lbs): 162 Respiratory Rate (breaths/min): 18 Body Mass Index (BMI): 29.6 Blood Pressure (mmHg): 125/75 Reference Range: 80 - 120 mg / dl Electronic Signature(s) Signed: 03/10/2023 4:28:49 PM By: Levora Dredge Entered By: Levora Dredge on 03/10/2023 12:09:42

## 2023-03-17 ENCOUNTER — Encounter: Payer: Medicare Other | Admitting: Physician Assistant

## 2023-03-17 DIAGNOSIS — G603 Idiopathic progressive neuropathy: Secondary | ICD-10-CM | POA: Diagnosis not present

## 2023-03-17 NOTE — Progress Notes (Signed)
[place], [name] ([place]:[id number]) [id number] Page 1 of 11 Visit Report for 03/17/2023 Arrival Information Details Patient Name: Date of Service: VOILET, DIGRAZIA 03/17/2023 2:00 PM Medical Record Number: [id number] Patient Account Number: [id number] Date of Birth/Sex: Treating RN: [birth year] (72 y.o. [name] Primary Care Knoah Nedeau: PA Haig Prophet, [place] Other Clinician: Referring Nancie Bocanegra: Treating Evert Wenrich/Extender: Luiz Ochoa in Treatment: 4 Visit Information History Since Last Visit Added or deleted any medications: No Patient Arrived: Wheel Chair Any new allergies or adverse reactions: No Arrival Time: 14:18 Had a fall or experienced change in No Accompanied By: daughter activities of daily living that may affect Transfer Assistance: EasyPivot Patient Lift risk of falls: Patient Identification Verified: Yes Hospitalized since last visit: No Secondary Verification Process Completed: Yes Has Dressing in Place as Prescribed: Yes Patient Requires Transmission-Based Precautions: No Pain Present Now: No Patient Has Alerts: Yes Patient Alerts: Patient on Blood Thinner PLAVIX ASA 81 mg R ABI 0.70 TBI 0.34 L ABI 0.76 TBI 0.00 Electronic Signature(s) Signed: 03/17/2023 4:21:05 PM By: Levora Dredge Entered By: Levora Dredge on 03/17/2023 14:19:17 -------------------------------------------------------------------------------- Clinic Level of Care Assessment Details Patient Name: Date of Service: LEAR, KAINA 03/17/2023 2:00 PM Medical Record Number: [id number] Patient Account Number: [id number] Date of Birth/Sex: Treating RN: 1951/10/28 (72 y.o. [name] Primary Care Antwonette Feliz: PA Haig Prophet, [place] Other Clinician: Referring Nayara Taplin: Treating Jahmeir Geisen/Extender: Luiz Ochoa in Treatment: 4 Clinic Level of Care Assessment Items TOOL 1 Quantity Score []  - 0 Use when EandM and Procedure is performed on INITIAL  visit ASSESSMENTS - Nursing Assessment / Reassessment []  - 0 General Physical Exam (combine w/ comprehensive assessment (listed just below) when performed on new pt. evals) []  - 0 Comprehensive Assessment (HX, ROS, Risk Assessments, Wounds Hx, etc.) ASSESSMENTS - Wound and Skin Assessment / Reassessment []  - 0 Dermatologic / Skin Assessment (not related to wound area) ASSESSMENTS - Ostomy and/or Continence Assessment and Care []  - 0 Incontinence Assessment and Management []  - 0 Ostomy Care Assessment and Management (repouching, etc.) PROCESS - Coordination of Care []  - 0 Simple Patient / Family Education for ongoing care []  - 0 Complex (extensive) Patient / Family Education for ongoing care []  - 0 Staff obtains Programmer, systems, Records, T Results / Process Orders est []  - 0 Staff telephones HHA, Nursing Homes / Clarify orders / etc []  - 0 Routine Transfer to another Facility (non-emergent condition) []  - 0 Routine Hospital Admission (non-emergent condition) []  - 0 New Admissions / Insurance Authorizations / Ordering NPWT Apligraf, etc. , Gray Bernhardt, M[place] ([place]:[id number]) [id number] Page 2 of 11 []  - 0 Emergency Hospital Admission (emergent condition) PROCESS - Special Needs []  - 0 Pediatric / Minor Patient Management []  - 0 Isolation Patient Management []  - 0 Hearing / Language / Visual special needs []  - 0 Assessment of Community assistance (transportation, D/C planning, etc.) []  - 0 Additional assistance / Altered mentation []  - 0 Support Surface(s) Assessment (bed, cushion, seat, etc.) INTERVENTIONS - Miscellaneous []  - 0 External ear exam []  - 0 Patient Transfer (multiple staff / Civil Service fast streamer / Similar devices) []  - 0 Simple Staple / Suture removal (25 or less) []  - 0 Complex Staple / Suture removal (26 or more) []  - 0 Hypo/Hyperglycemic Management (do not check if billed separately) []  - 0 Ankle / Brachial Index (ABI) - do not check if billed  separately Has the patient been seen at the hospital within the last three years: Yes Total Score: 0 Level Of Care:  ____ Electronic Signature(s) Signed: 03/17/2023 4:21:05 PM By: Levora Dredge Entered By: Levora Dredge on 03/17/2023 14:51:34 -------------------------------------------------------------------------------- Encounter Discharge Information Details Patient Name: Date of Service: [name], [place] 03/17/2023 2:00 PM Medical Record Number: [id number] Patient Account Number: [id number] Date of Birth/Sex: Treating RN: 05/25/1951 (72 y.o. [name] Primary Care Aston Lieske: PA Haig Prophet, [place] Other Clinician: Referring Kismet Facemire: Treating Addeline Calarco/Extender: Luiz Ochoa in Treatment: 4 Encounter Discharge Information Items Post Procedure Vitals Discharge Condition: Stable Temperature (F): 98.2 Ambulatory Status: Wheelchair Pulse (bpm): 85 Discharge Destination: Home Respiratory Rate (breaths/min): 18 Transportation: Private Auto Blood Pressure (mmHg): 126/76 Accompanied By: daughter Schedule Follow-up Appointment: Yes Clinical Summary of Care: Electronic Signature(s) Signed: 03/17/2023 4:00:33 PM By: Levora Dredge Entered By: Levora Dredge on 03/17/2023 16:00:33 -------------------------------------------------------------------------------- Lower Extremity Assessment Details Patient Name: Date of Service: LEONILDA, CABREROS 03/17/2023 2:00 PM Medical Record Number: [id number] Patient Account Number: [id number] Date of Birth/Sex: Treating RN: Dec 20, 1951 (71 y.o. [name] Primary Care Mallerie Blok: PA Haig Prophet, [place] Other Clinician: Referring Filipe Greathouse: Treating Davaris Youtsey/Extender: Luiz Ochoa in Treatment: 4 Edema Assessment Assessed: [Left: No] [Right: No] Edema: [Left: Ye] [Right: s] B[LeftClaris Che, Emiline [id number] [[id number] Page 3 of 11] Vascular Assessment Pulses: Dorsalis  Pedis Doppler Audible: [Right:Yes] Posterior Tibial Doppler Audible: [Right:Yes] Electronic Signature(s) Signed: 03/17/2023 4:21:05 PM By: Levora Dredge Entered By: Levora Dredge on 03/17/2023 14:31:35 -------------------------------------------------------------------------------- Multi Wound Chart Details Patient Name: Date of Service: [name], [place] 03/17/2023 2:00 PM Medical Record Number: [id number] Patient Account Number: [id number] Date of Birth/Sex: Treating RN: 1951-06-09 (72 y.o. 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 Etiology: Asthma, Chronic Obstructive Asthma, Chronic Obstructive Asthma, Chronic Obstructive Comorbid History: Pulmonary Disease (COPD), Coronary Pulmonary Disease (COPD), Coronary Pulmonary Disease (COPD), Coronary Artery Disease Artery Disease Artery Disease 12/29/2022 12/29/2022 12/29/2022 Date Acquired: 4 4 4  Weeks of Treatment: Open Open Open Wound Status: No No No Wound Recurrence: Yes Yes Yes Pending A mputation on Presentation: 3.3x1.9x0.1 1.5x1.4x0.1 0.1x0.1x0.1 Measurements L x W x D (cm) 4.924 1.649 0.008 A (cm) : rea 0.492 0.165 0.001 Volume (cm) : 7.10% 30.00% 99.70% % Reduction in A rea: 53.60% 65.00% 99.70% % Reduction in Volume: Full Thickness Without Exposed Full Thickness Without Exposed Unclassifiable Classification: Support Structures Support  Structures Medium Medium None Present Exudate Amount: Serosanguineous Serosanguineous N/A Exudate Type: red, brown red, brown N/A Exudate Color: Medium (34-66%) Medium (34-66%) None Present (0%) Granulation Amount: Red, Pink Red, Pink N/A Granulation Quality: Medium (34-66%) Medium (34-66%) None Present (0%) Necrotic Amount: Fat Layer (Subcutaneous Tissue): Yes Fat Layer (Subcutaneous Tissue): Yes Fascia: No Exposed Structures: Fascia: No Fascia: No Fat Layer (Subcutaneous Tissue): No Tendon: No Tendon: No Tendon: No Muscle: No Muscle: No Muscle: No Joint: No Joint: No Joint: No Bone: No Bone: No Bone: No None None Large (67-100%) Epithelialization: Wound Number: 4 N/A N/A [name] ([place]:[id number].pdf Page 4 of 11 Photos: N/A N/A Right, Lateral T Fourth oe N/A N/A Wound Location: Pressure Injury N/A N/A Wounding Event: Neuropathic Ulcer-Non Diabetic N/A N/A Primary Etiology: Asthma, Chronic Obstructive N/A N/A Comorbid History:  Pulmonary Disease (COPD), Coronary Artery Disease 12/29/2022 N/A N/A Date Acquired: 4 N/A N/A Weeks of Treatment: Open N/A N/A Wound Status: No N/A N/A Wound Recurrence: Yes N/A N/A Pending A mputation on Presentation: 0.1x0.1x0.1 N/A N/A Measurements L x W x D (cm) 0.008 N/A N/A A (cm) : rea 0.001 N/A N/A Volume (cm) : 98.50% N/A N/A % Reduction in A rea: 98.20% N/A N/A % Reduction in Volume: Partial Thickness N/A N/A Classification: None Present N/A N/A Exudate A mount: N/A N/A N/A Exudate Type: N/A N/A N/A Exudate Color: None Present (0%) N/A N/A Granulation A mount: N/A N/A N/A Granulation Quality: None Present (0%) N/A N/A Necrotic A mount: Fascia: No N/A N/A Exposed Structures: Fat Layer (Subcutaneous Tissue): No Tendon: No Muscle: No Joint: No Bone: No Large (67-100%) N/A N/A Epithelialization: Treatment Notes Electronic Signature(s) Signed: 03/17/2023 4:21:05  PM By: Levora Dredge Entered By: Levora Dredge on 03/17/2023 14:42:11 -------------------------------------------------------------------------------- Multi-Disciplinary Care Plan Details Patient Name: Date of Service: [name], [place] 03/17/2023 2:00 PM Medical Record Number: [id number] Patient Account Number: [id number] Date of Birth/Sex: Treating RN: 01-Sep-1951 (71 y.o. [name] Primary Care Zanita Millman: PA Haig Prophet, [place] Other Clinician: Referring Neiva Maenza: Treating Airel Magadan/Extender: Luiz Ochoa in Treatment: 4 Active Inactive Necrotic Tissue Nursing Diagnoses: Impaired tissue integrity related to necrotic/devitalized tissue Knowledge deficit related to management of necrotic/devitalized tissue Goals: Necrotic/devitalized tissue will be minimized in the wound bed Date Initiated: 02/16/2023 Date Inactivated: 03/17/2023 Target Resolution Date: 03/17/2023 Goal Status: Met Patient/caregiver will verbalize understanding of reason and process for debridement of necrotic tissue Date Initiated: 02/16/2023 Target Resolution Date: 03/17/2023 Goal Status: Active [place], [name] ([place]:[id number]) [id number] Page 5 of 11 Interventions: Assess patient pain level pre-, during and post procedure and prior to discharge Provide education on necrotic tissue and debridement process Treatment Activities: Apply topical anesthetic as ordered : 02/16/2023 Excisional debridement : 02/16/2023 Notes: Wound/Skin Impairment Nursing Diagnoses: Impaired tissue integrity Knowledge deficit related to ulceration/compromised skin integrity Goals: Patient will have a decrease in wound volume by X% from date: (specify in notes) Date Initiated: 02/16/2023 Target Resolution Date: 03/17/2023 Goal Status: Active Patient/caregiver will verbalize understanding of skin care regimen Date Initiated: 02/16/2023 Date Inactivated: 03/03/2023 Target Resolution Date: 03/17/2023 Goal  Status: Met Ulcer/skin breakdown will have a volume reduction of 30% by week 4 Date Initiated: 02/16/2023 Target Resolution Date: 03/17/2023 Goal Status: Active Ulcer/skin breakdown will have a volume reduction of 50% by week 8 Date Initiated: 02/16/2023 Target Resolution Date: 04/17/2023 Goal Status: Active Ulcer/skin breakdown will have a volume reduction of 80% by week 12 Date Initiated: 02/16/2023 Target Resolution Date: 05/17/2023 Goal Status: Active Interventions: Assess patient/caregiver ability to obtain necessary supplies Assess patient/caregiver ability to perform ulcer/skin care regimen upon admission and as needed Assess ulceration(s) every visit Provide education on ulcer and skin care Treatment Activities: Referred to DME Shivaan Tierno for dressing supplies : 02/16/2023 Skin care regimen initiated : 02/16/2023 Notes: Electronic Signature(s) Signed: 03/17/2023 3:59:21 PM By: Levora Dredge Entered By: Levora Dredge on 03/17/2023 15:59:21 -------------------------------------------------------------------------------- Pain Assessment Details Patient Name: Date of Service: LEISLY, SHECK 03/17/2023 2:00 PM Medical Record Number: [id number] Patient Account Number: [id number] Date of Birth/Sex: Treating RN: Jun 19, 1951 (72 y.o. [name] Primary Care Monseratt Ledin: PA Haig Prophet, [place] Other Clinician: Referring Anice Wilshire: Treating Garnetta Fedrick/Extender: Luiz Ochoa in Treatment: 4 Active Problems Location of Pain Severity and Description of Pain Patient Has Paino No Site Locations Rate the pain. [place], [name] ([place]:[id number]) [id number] Page 6 of 11  Rate the pain. Current Pain Level: 0 Pain Management and Medication Current Pain Management: Electronic Signature(s) Signed: 03/17/2023 4:21:05 PM By: Levora Dredge Entered By: Levora Dredge on 03/17/2023  14:19:41 -------------------------------------------------------------------------------- Patient/Caregiver Education Details Patient Name: Date of Service: [name], [place] 3/26/2024andnbsp2:00 PM Medical Record Number: [id number] Patient Account Number: [id number] Date of Birth/Gender: Treating RN: [birth year] (72 y.o. [name] Primary Care Physician: PA Haig Prophet, [place] Other Clinician: Referring Physician: Treating Physician/Extender: Luiz Ochoa in Treatment: 4 Education Assessment Education Provided To: Patient Education Topics Provided Wound Debridement: Handouts: Wound Debridement Methods: Explain/Verbal Responses: State content correctly Wound/Skin Impairment: Handouts: Caring for Your Ulcer Methods: Explain/Verbal Responses: State content correctly Electronic Signature(s) Signed: 03/17/2023 4:21:05 PM By: Levora Dredge Entered By: Levora Dredge on 03/17/2023 15:59:34 -------------------------------------------------------------------------------- Wound Assessment Details Patient Name: Date of Service: [name], [place] 03/17/2023 2:00 PM Medical Record Number: [id number] Patient Account Number: [id number] Date of Birth/Sex: Treating RN: 11/22/1951 (72 y.o. [name] Primary Care Amil Moseman: PA Haig Prophet, [place] Other Clinician: Referring Federick Levene: Treating Arlind Klingerman/Extender: Luiz Ochoa in Treatment: 4 Wound Status [place], [place] ([place]:[id number]) [id number] Page 7 of 11 Wound Number: 1 Primary Neuropathic Ulcer-Non Diabetic Etiology: Wound Location: Right, Lateral T Great oe Wound Open Wounding Event: Gradually Appeared Status: Date Acquired: 12/29/2022 Comorbid Asthma, Chronic Obstructive Pulmonary Disease (COPD), Weeks Of Treatment: 4 History: Coronary Artery Disease Clustered Wound: No Pending Amputation On Presentation Photos Wound Measurements Length: (cm) 3.3 Width: (cm) 1.9 Depth: (cm)  0.1 Area: (cm) 4.924 Volume: (cm) 0.492 % Reduction in Area: 7.1% % Reduction in Volume: 53.6% Epithelialization: None Tunneling: No Undermining: No Wound Description Classification: Full Thickness Without Exposed Suppor Exudate Amount: Medium Exudate Type: Serosanguineous Exudate Color: red, brown t Structures Foul Odor After Cleansing: No Slough/Fibrino Yes Wound Bed Granulation Amount: Medium (34-66%) Exposed Structure Granulation Quality: Red, Pink Fascia Exposed: No Necrotic Amount: Medium (34-66%) Fat Layer (Subcutaneous Tissue) Exposed: Yes Necrotic Quality: Adherent Slough Tendon Exposed: No Muscle Exposed: No Joint Exposed: No Bone Exposed: No Treatment Notes Wound #1 (Toe Great) Wound Laterality: Right, Lateral Cleanser [place] Ancillary Kit - 15 Day Supply Discharge Instruction: Use supplies as instructed; Kit contains: (15) Saline Bullets; (15) 3x3 Gauze; 15 pr Gloves Soap and Water Discharge Instruction: Gently cleanse wound with antibacterial soap, rinse and pat dry prior to dressing wounds Peri-Wound Care Topical Primary Dressing Gauze Discharge Instruction: As directed: dry Hydrofera Blue Ready Transfer Foam, 2.5x2.5 (in/in) Discharge Instruction: Apply Hydrofera Blue Ready to wound bed as directed Secondary Dressing Conforming [place] Roll-Small Discharge Instruction: [place] as directed Secured With Medipore T - 69M Medipore H Soft Cloth Surgical T ape ape, 2x2 (in/yd) Compression Wrap Compression Stockings [place]ornado, Khadijah ([place]:[id number]) [phone].pdf Page 8 of 11 Add-Ons Electronic Signature(s) Signed: 03/17/2023 4:21:05 PM By: Levora Dredge Entered By: Levora Dredge on 03/17/2023 14:29:42 -------------------------------------------------------------------------------- Wound Assessment Details Patient Name: Date of Service: CELISSE, GRIBBEN 03/17/2023 2:00 PM Medical Record Number:  [id number] Patient Account Number: [id number] Date of Birth/Sex: Treating RN: May 12, 1951 (72 y.o. [name] Primary Care Triana Coover: PA Haig Prophet, [place] Other Clinician: Referring Kodey Xue: Treating Shanie Mauzy/Extender: Luiz Ochoa in Treatment: 4 Wound Status Wound Number: 2 Primary Neuropathic Ulcer-Non Diabetic Etiology: Wound Location: Right, Lateral Foot Wound Open Wounding Event: Pressure Injury Status: Date Acquired: 12/29/2022 Comorbid Asthma, Chronic Obstructive Pulmonary Disease (COPD), Weeks Of Treatment: 4 History: Coronary Artery Disease Clustered Wound: No Pending Amputation On Presentation Photos Wound Measurements Length: (cm) 1.5 Width: (cm) 1.4  Depth: (cm) 0.1 Area: (cm) 1.649 Volume: (cm) 0.165 % Reduction in Area: 30% % Reduction in Volume: 65% Epithelialization: None Tunneling: No Undermining: No Wound Description Classification: Full Thickness Without Exposed Suppor Exudate Amount: Medium Exudate Type: Serosanguineous Exudate Color: red, brown t Structures Foul Odor After Cleansing: No Slough/Fibrino Yes Wound Bed Granulation Amount: Medium (34-66%) Exposed Structure Granulation Quality: Red, Pink Fascia Exposed: No Necrotic Amount: Medium (34-66%) Fat Layer (Subcutaneous Tissue) Exposed: Yes Necrotic Quality: Adherent Slough Tendon Exposed: No Muscle Exposed: No Joint Exposed: No Bone Exposed: No Treatment Notes Wound #2 (Foot) Wound Laterality: Right, Lateral Cleanser Soap and Water Discharge Instruction: Gently cleanse wound with antibacterial soap, rinse and pat dry prior to dressing wounds Peri-Wound Care [place], [place] ([place]:[id number].pdf Page 9 of 11 Topical Primary Dressing Xeroform-HBD 2x2 (in/in) Discharge Instruction: Apply Xeroform-HBD 2x2 (in/in) as directed Secondary Dressing Conforming [place] Roll-Small Discharge Instruction: Apply Conforming Stretch [place] Bandage as  directed Secured With [place] H Soft Cloth Surgical T ape ape, 2x2 (in/yd) Compression Wrap Compression Stockings Add-Ons Electronic Signature(s) Signed: 03/17/2023 4:21:05 PM By: Levora Dredge Entered By: Levora Dredge on 03/17/2023 14:30:18 -------------------------------------------------------------------------------- Wound Assessment Details Patient Name: Date of Service: KENYAH, OLLER 03/17/2023 2:00 PM Medical Record Number: [id number] Patient Account Number: [id number] Date of Birth/Sex: Treating RN: 1951-10-20 (72 y.o. [name] Primary Care Mylo Choi: PA [place], [place] Other Clinician: Referring Oather Muilenburg: Treating Reonna Finlayson/Extender: Luiz Ochoa in Treatment: 4 Wound Status Wound Number: 3 Primary Neuropathic Ulcer-Non Diabetic Etiology: Wound Location: Right, Lateral T Fifth oe Wound Open Wounding Event: Pressure Injury Status: Date Acquired: 12/29/2022 Comorbid Asthma, Chronic Obstructive Pulmonary Disease (COPD), Weeks Of Treatment: 4 History: Coronary Artery Disease Clustered Wound: No Pending Amputation On Presentation Photos Wound Measurements Length: (cm) 0.1 Width: (cm) 0.1 Depth: (cm) 0.1 Area: (cm) 0.008 Volume: (cm) 0.001 % Reduction in Area: 99.7% % Reduction in Volume: 99.7% Epithelialization: Large (67-100%) Tunneling: No Undermining: No Wound Description Classification: Unclassifiable Exudate Amount: None Present Foul Odor After Cleansing: No Slough/Fibrino No Wound Bed Granulation Amount: None Present (0%) Exposed Structure Necrotic Amount: None Present (0%) Fascia Exposed: No Fat Layer (Subcutaneous Tissue) Exposed: No Tendon Exposed: No [place], Amory ([place]:[id number].pdf Page 10 of 11 Muscle Exposed: No Joint Exposed: No Bone Exposed: No Treatment Notes Wound #3 (Toe Fifth) Wound Laterality: Right, Lateral Cleanser Soap and Water Discharge Instruction:  Gently cleanse wound with antibacterial soap, rinse and pat dry prior to dressing wounds Peri-Wound Care Topical Primary Dressing Xeroform-HBD 2x2 (in/in) Discharge Instruction: Apply Xeroform-HBD 2x2 (in/in) as directed Secondary Dressing Conforming [place] Roll-Small Discharge Instruction: [place] as directed Secured With [place] H Soft Cloth Surgical T ape ape, 2x2 (in/yd) Compression Wrap Compression Stockings Add-Ons Electronic Signature(s) Signed: 03/17/2023 4:21:05 PM By: Levora Dredge Entered By: Levora Dredge on 03/17/2023 14:30:51 -------------------------------------------------------------------------------- Wound Assessment Details Patient Name: Date of Service: AAHLIYAH, GAYTON 03/17/2023 2:00 PM Medical Record Number: [id number] Patient Account Number: [id number] Date of Birth/Sex: Treating RN: 1951-12-12 (72 y.o. [name] Primary Care Anner Baity: PA Haig Prophet, [place] Other Clinician: Referring Linzee Depaul: Treating Sonia Stickels/Extender: Luiz Ochoa in Treatment: 4 Wound Status Wound Number: 4 Primary Neuropathic Ulcer-Non Diabetic Etiology: Wound Location: Right, Lateral T Fourth oe Wound Open Wounding Event: Pressure Injury Status: Date Acquired: 12/29/2022 Comorbid Asthma, Chronic Obstructive Pulmonary Disease (COPD), Weeks Of Treatment: 4 History: Coronary Artery Disease Clustered Wound: No Pending Amputation On Presentation Photos Wound Measurements Length: (cm)  0.1 Width: (cm) 0.1 Depth: (cm) 0.1 [place], Wandy ([id number]) Area: (cm) 0.008 Volume: (cm) 0.001 % Reduction in Area: 98.5% % Reduction in Volume: 98.2% Epithelialization: Large (67-100%) [id number] Page 11 of 11 Tunneling: No Undermining: No Wound Description Classification: Partial Thickness Exudate Amount: None Present Foul Odor After Cleansing: No Slough/Fibrino No Wound  Bed Granulation Amount: None Present (0%) Exposed Structure Necrotic Amount: None Present (0%) Fascia Exposed: No Fat Layer (Subcutaneous Tissue) Exposed: No Tendon Exposed: No Muscle Exposed: No Joint Exposed: No Bone Exposed: No Treatment Notes Wound #4 (Toe Fourth) Wound Laterality: Right, Lateral Cleanser Soap and Water Discharge Instruction: Gently cleanse wound with antibacterial soap, rinse and pat dry prior to dressing wounds Peri-Wound Care Topical Primary Dressing Xeroform-HBD 2x2 (in/in) Discharge Instruction: Apply Xeroform-HBD 2x2 (in/in) as directed Secondary Dressing [place] Roll-Small Discharge Instruction: Apply Conforming Stretch Guaze Bandage as directed Secured With Medipore T - 14M Medipore H Soft Cloth Surgical T ape ape, 2x2 (in/yd) Compression Wrap Compression Stockings Add-Ons Electronic Signature(s) Signed: 03/17/2023 4:21:05 PM By: Levora Dredge Entered By: Levora Dredge on 03/17/2023 14:31:21 -------------------------------------------------------------------------------- Vitals Details Patient Name: Date of Service: [name], [place] 03/17/2023 2:00 PM Medical Record Number: [id number] Patient Account Number: [id number] Date of Birth/Sex: Treating RN: 1951/09/15 (72 y.o. [name] Primary Care Toinette Lackie: PA [place], [place] Other Clinician: Referring Nuriyah Hanline: Treating Dorris Vangorder/Extender: Luiz Ochoa in Treatment: 4 Vital Signs Time Taken: 14:19 Temperature (F): 98.2 Height (in): 62 Pulse (bpm): 85 Weight (lbs): 162 Respiratory Rate (breaths/min): 18 Body Mass Index (BMI): 29.6 Blood Pressure (mmHg): 126/76 Reference Range: 80 - 120 mg / dl Electronic Signature(s) Signed: 03/17/2023 4:21:05 PM By: Levora Dredge Entered By: Levora Dredge on 03/17/2023 14:19:33

## 2023-03-17 NOTE — Progress Notes (Signed)
[name] ([id number]) [id number] Page 1 of 8 Visit Report for 03/17/2023 Chief Complaint Document Details Patient Name: Date of Service: [name], [name] 03/17/2023 2:00 PM Medical Record Number: [id number] Patient Account Number: [id number] Date of Birth/Sex: Treating RN: 10/18/1951 (72 y.o. [name] Primary Care Provider: PA [name], [place] Other Clinician: Referring Provider: Treating Provider/Extender: [name] in Treatment: 4 Information Obtained from: Patient Chief Complaint Right foot ulcers Electronic Signature(s) Signed: 03/17/2023 2:39:14 PM By: Worthy Keeler PA-C Entered By: Worthy Keeler on 03/17/2023 14:39:14 -------------------------------------------------------------------------------- Debridement Details Patient Name: Date of Service: [name], [name] 03/17/2023 2:00 PM Medical Record Number: [id number] Patient Account Number: [id number] Date of Birth/Sex: Treating RN: [birth year] (72 y.o. [name] Primary Care Provider: PA [name], [place] Other Clinician: Referring Provider: Treating Provider/Extender: [name] in Treatment: 4 Debridement Performed for Assessment: Wound #2 Right,Lateral Foot Performed By: Physician Tommie Sams., PA-C Debridement Type: Debridement Level of Consciousness (Pre-procedure): Awake and Alert Pre-procedure Verification/Time Out Yes - 14:44 Taken: Pain Control: Lidocaine 4% T opical Solution T Area Debrided (L x W): otal 1.5 (cm) x 1.4 (cm) = 2.1 (cm) Tissue and other material debrided: Viable, Non-Viable, Slough, Subcutaneous, Slough Level: Skin/Subcutaneous Tissue Debridement Description: Excisional Instrument: Curette Bleeding: Minimum Hemostasis Achieved: Pressure Response to Treatment: Procedure was tolerated well Level of Consciousness (Post- Awake and Alert procedure): Post Debridement Measurements of Total Wound Length: (cm)  1.5 Width: (cm) 1.4 Depth: (cm) 0.1 Volume: (cm) 0.165 Character of Wound/Ulcer Post Debridement: Stable Post Procedure Diagnosis Same as Pre-procedure Electronic Signature(s) Signed: 03/17/2023 4:21:05 PM By: Levora Dredge Signed: 03/17/2023 6:13:56 PM By: Worthy Keeler PA-C Entered By: Levora Dredge on 03/17/2023 14:45:14 Debridement Details -------------------------------------------------------------------------------- [name] ([id number]) [id number] Page 2 of 8 Patient Name: Date of Service: [name], [name] 03/17/2023 2:00 PM Medical Record Number: [id number] Patient Account Number: [id number] Date of Birth/Sex: Treating RN: 04/20/1951 (72 y.o. [name] Primary Care Provider: PA [name], [place] Other Clinician: Referring Provider: Treating Provider/Extender: [name] in Treatment: 4 Debridement Performed for Assessment: Wound #1 Right,Lateral T Great oe Performed By: Physician Tommie Sams., PA-C Debridement Type: Debridement Level of Consciousness (Pre-procedure): Awake and Alert Pre-procedure Verification/Time Out Yes - 14:45 Taken: Pain Control: Lidocaine 4% T opical Solution T Area Debrided (L x W): otal 3.3 (cm) x 1.9 (cm) = 6.27 (cm) Tissue and other material debrided: Viable, Non-Viable, Callus, Slough, Subcutaneous, Slough Level: Skin/Subcutaneous Tissue Debridement Description: Excisional Instrument: Curette Bleeding: Moderate Hemostasis Achieved: Pressure Response to Treatment: Procedure was tolerated well Level of Consciousness (Post- Awake and Alert procedure): Post Debridement Measurements of Total Wound Length: (cm) 3.3 Width: (cm) 1.9 Depth: (cm) 0.1 Volume: (cm) 0.492 Character of Wound/Ulcer Post Debridement: Stable Post Procedure Diagnosis Same as Pre-procedure Electronic Signature(s) Signed: 03/17/2023 4:21:05 PM By: Levora Dredge Signed: 03/17/2023 6:13:56 PM By: Worthy Keeler PA-C Entered By: Levora Dredge on 03/17/2023 14:51:05 -------------------------------------------------------------------------------- HPI Details Patient Name: Date of Service: [name], [name] 03/17/2023 2:00 PM Medical Record Number: [id number] Patient Account Number: [id number] Date of Birth/Sex: Treating RN: 1951/12/17 (72 y.o. [name] Primary Care Provider: PA [name], [place] Other Clinician: Referring Provider: Treating Provider/Extender: [name] in Treatment: 4 History of Present Illness HPI Description: 02-16-2023 upon evaluation today patient presents for initial inspection here in our clinic concerning issues that she has been having with wounds over the right foot in the realm of several toes as well  as the lateral portion of her foot fifth metatarsal location. Subsequently she unfortunately has somewhat poor arterial flow. I do have notes that I reviewed in epic and subsequently the patient did have an angiogram as well back in January. The testing following the angiogram revealed after stent placement on 01-09-2023 that she subsequently still had somewhat compromised arterial flow following with a right ABI of 0.70 with a TBI of 0.34 and on the left an ABI of 0.76 and the TBI could not be measured. 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 With that being said the follow-up as far as her arterial testing they did not do ABIs and TBI's but it showed that she was monophasic pretty much throughout and in general I think this still shows that there is a significant issue here and the vascular doctors seem to be in agreement as far as this is concerned they have recommended that she really needs to have repeat arterial angiogram with likely intervention to try to improve that monophasic flow. With that being said the patient is somewhat reluctant to do this though I think that she is leaning towards it even though she had a lot of bleeding issues in the  groin area following the last time she had this done. In regard to her wound she is actually having quite a bit of an issue here with these wounds specifically in the realm of getting them to turn around and heal appropriately. She tells me that the areas are looking much better than they were but nonetheless she still is noticing that the Betadine seems to be keeping things a little bit dry but some areas are starting to drain a lot more unfortunately. She is on aspirin and Plavix following the stent placement. This is probably part of the bleeding issues that she had previously in the groin to be peripherally honest. She does have a history of COPD and coronary artery disease as well. 3/5; patient is in for her second clinic visit. She has significant PAD status post revascularization a month ago. According to the patient she is being scheduled for another 1 in a month's time. She has 4 wounds on the right foot lateral foot proximal foot fourth toe and the medial part of the right first toe 3/12; patient presents for follow-up. She has been using Xeroform to the wound beds. She has no issues or complaints today. 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 03-10-2023 upon evaluation today patient presents for follow-up concerning her ongoing issues with her right lower extremity ulcers on foot. She has not seen Bonsall, Dwayna ([id number]) [id number] Page 3 of 8 vascular yet they called them but they have not actually gotten her scheduled. They state they are to call back and see if they can get things moving along. With that being said she does seem to be making some improvements with regard to her toes and I am very pleased in that regard I feel like the wounds are looking better today. 3B-26-24 upon evaluation today patient appears to be doing well currently in regard to her wounds all things considered I definitely see signs of  improvement which is great news. Fortunately there does not appear to be any evidence of active infection locally nor systemically which is great news. No fevers, chills, nausea, vomiting, or diarrhea. Electronic Signature(s) Signed: 03/17/2023 5:25:45 PM By: Worthy Keeler PA-C Entered By: Worthy Keeler on 03/17/2023 17:25:45 -------------------------------------------------------------------------------- Physical Exam Details Patient Name: Date  of ServiceBRIARA, [name] 03/17/2023 2:00 PM Medical Record Number: [id number] Patient Account Number: [id number] Date of Birth/Sex: Treating RN: 09/18/1951 (72 y.o. [name] Primary Care Provider: PA [name], [place] Other Clinician: Referring Provider: Treating Provider/Extender: [name] in Treatment: 4 Constitutional Well-nourished and well-hydrated in no acute distress. Respiratory normal breathing without difficulty. Psychiatric this patient is able to make decisions and demonstrates good insight into disease process. Alert and Oriented x 3. pleasant and cooperative. Notes Upon inspection patient's wound bed actually showed signs of good granulation and epithelization at this point. Overall I am extremely pleased with where we stand and I do believe that the patient is making good progress and I think that week by week we are seeing this improvement in the overall appearance of the wound she is extremely happy with where we are and I agree that this does seem to be doing quite well. Electronic Signature(s) Signed: 03/17/2023 5:26:29 PM By: Worthy Keeler PA-C Entered By: Worthy Keeler on 03/17/2023 17:26:29 -------------------------------------------------------------------------------- Physician Orders Details Patient Name: Date of Service: [name], [name] 03/17/2023 2:00 PM Medical Record Number: [id number] Patient Account Number: [id number] Date of Birth/Sex: Treating RN: May 09, 1951 (72 y.o. 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 Bathing/ [employer] wounds with antibacterial soap and water. Anesthetic (Use 'Patient Medications' Section for Anesthetic Order Entry) Lidocaine applied to wound bed Douglas, Malene ([id number]) [phone].pdf Page 4 of 8 Wound Treatment Wound #1 - T Great oe Wound Laterality: Right, Lateral Cleanser: [place] Ancillary Kit - 15 Day Supply (Generic) 1 x Per Day/30 Days Discharge Instructions: Use supplies as instructed; Kit contains: (15) Saline Bullets; (15) 3x3 Gauze; 15 pr Gloves Cleanser: Soap and Water 1 x Per Day/30 Days Discharge Instructions: Gently cleanse wound with antibacterial soap, rinse and pat dry prior to dressing wounds Prim Dressing: Gauze 1 x Per Day/30 Days ary Discharge Instructions: As directed: dry Prim Dressing: Hydrofera Blue Ready Transfer Foam, 2.5x2.5 (in/in) 1 x Per Day/30 Days ary Discharge Instructions: Apply Hydrofera Blue Ready to wound bed as directed Secondary Dressing: Conforming Guaze Roll-Small 1 x Per Day/30 Days Discharge Instructions: Apply Conforming Stretch Guaze Bandage as directed Secured With: Medipore T - 29M Medipore H Soft Cloth Surgical T ape ape, 2x2 (in/yd) (Generic) 1 x Per Day/30 Days Wound #2 - Foot Wound Laterality: Right, Lateral Cleanser: Soap and Water 1 x Per Day/30 Days Discharge  Instructions: Gently cleanse wound with antibacterial soap, rinse and pat dry prior to dressing wounds Prim Dressing: Xeroform-HBD 2x2 (in/in) (Generic) 1 x Per Day/30 Days ary Discharge Instructions: Apply Xeroform-HBD 2x2 (in/in) as directed Secondary Dressing: Conforming Guaze Roll-Small (Generic) 1 x Per Day/30 Days Discharge Instructions: Apply Conforming Stretch Guaze Bandage as directed Secured With: Medipore T - 29M Medipore H Soft Cloth Surgical T ape ape, 2x2 (in/yd) 1 x Per Day/30 Days Wound #3 - T Fifth oe Wound Laterality: Right, Lateral Cleanser: Soap and Water 1 x Per Day/30 Days Discharge Instructions: Gently cleanse wound with antibacterial soap, rinse and pat dry  prior to dressing wounds Prim Dressing: Xeroform-HBD 2x2 (in/in) (Generic) 1 x Per Day/30 Days ary Discharge Instructions: Apply Xeroform-HBD 2x2 (in/in) as directed Secondary Dressing: Conforming Guaze Roll-Small (Generic) 1 x Per Day/30 Days Discharge Instructions: Apply Conforming Stretch Guaze Bandage as directed Secured With: Medipore T - 68M Medipore H Soft Cloth Surgical T ape ape, 2x2 (in/yd) 1 x Per Day/30 Days Wound #4 - T Fourth oe Wound Laterality: Right, Lateral Cleanser: Soap and Water 1 x Per Day/30 Days Discharge Instructions: Gently cleanse wound with antibacterial soap, rinse and pat dry prior to dressing wounds Prim Dressing: Xeroform-HBD 2x2 (in/in) (Generic) 1 x Per Day/30 Days ary Discharge Instructions: Apply Xeroform-HBD 2x2 (in/in) as directed Secondary Dressing: Conforming Guaze Roll-Small (Generic) 1 x Per Day/30 Days Discharge Instructions: Apply Conforming Stretch Guaze Bandage as directed Secured With: Medipore T - 68M Medipore H Soft Cloth Surgical T ape ape, 2x2 (in/yd) 1 x Per Day/30 Days Electronic Signature(s) Signed: 03/17/2023 4:21:05 PM By: Levora Dredge Signed: 03/17/2023 6:13:56 PM By: Worthy Keeler PA-C Entered By: Levora Dredge on 03/17/2023  14:51:27 -------------------------------------------------------------------------------- Problem List Details Patient Name: Date of Service: [name], [place] 03/17/2023 2:00 PM Medical Record Number: [id number] Patient Account Number: [id number] Date of Birth/Sex: Treating RN: Feb 27, 1951 (72 y.o. 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 02/16/2023 No Yes L97.512 Non-pressure chronic ulcer of other part of right foot with fat layer exposed 02/16/2023 No Yes Z79.01 Long term (current) use of anticoagulants 02/16/2023 No Yes J44.9 Chronic obstructive pulmonary disease, unspecified 02/16/2023 No Yes I25.10 Atherosclerotic heart disease of native coronary artery without angina pectoris 02/16/2023 No Yes Inactive Problems Resolved Problems Electronic Signature(s) Signed: 03/17/2023 2:38:34 PM By: Worthy Keeler PA-C Entered By: Worthy Keeler on 03/17/2023 14:38:34 -------------------------------------------------------------------------------- Progress Note Details Patient Name: Date of Service: [name], [place] 03/17/2023 2:00 PM Medical Record Number: [id number] Patient Account Number: [id number] Date of Birth/Sex: Treating RN: 10/03/1951 (72 y.o. [name] Primary Care Provider: PA [place], [place] Other Clinician: Referring Provider: Treating Provider/Extender: [name] in Treatment: 4 Subjective Chief Complaint Information obtained from Patient Right foot ulcers History of Present Illness (HPI) 02-16-2023 upon evaluation today patient presents for initial inspection here in our clinic concerning issues that she has been having with wounds  over the right foot in the realm of several toes as well as the lateral portion of her foot fifth metatarsal location. Subsequently she unfortunately has somewhat poor arterial flow. I do have notes that I reviewed in epic and subsequently the patient did have an angiogram as well back in January. The testing following the angiogram revealed after stent placement on 01-09-2023 that she subsequently still had somewhat compromised arterial flow following with a right ABI of 0.70 with a TBI of 0.34 and on the left an ABI of 0.76 and the TBI could not be measured. 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 With that being said the follow-up as far as her arterial testing they did not do ABIs and TBI's but it showed that she was monophasic pretty much throughout and in general I think this still shows that there is a significant issue here and the vascular doctors seem to be in agreement as far as this is concerned they have recommended  that she really needs to have repeat arterial angiogram with likely intervention to try to improve that monophasic flow. With that being said the patient is somewhat reluctant to do this though I think that she is leaning towards it even though she had a lot of bleeding issues in the groin area following the last time she had this done. In regard to her wound she is actually having quite a bit of an issue here with these wounds specifically in the realm of getting them to turn around and heal appropriately. She tells me that the areas are looking much better than they were but nonetheless she still is noticing that the Betadine seems to be keeping things a little bit dry but some areas are starting to drain a lot more unfortunately. She is on aspirin and Plavix following the stent placement. This is probably part of the bleeding issues that she had previously in the groin to be peripherally honest. She does have a history of COPD and coronary artery disease as well. 3/5; patient  is in for her second clinic visit. She has significant PAD status post revascularization a month ago. According to the patient she is being scheduled for another 1 in a month's time. She has 4 wounds on the right foot lateral foot proximal foot fourth toe and the medial part of the right first toe 3/12; patient presents for follow-up. She has been using Xeroform to the wound beds. She has no issues or complaints today. 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 03-10-2023 upon evaluation today patient presents for follow-up concerning her ongoing issues with her right lower extremity ulcers on foot. She has not seen vascular yet they called them but they have not actually gotten her scheduled. They state they are to call back and see if they can get things moving along. With that being said she does seem to be making some improvements with regard to her toes and I am very pleased in that regard I feel like the wounds are [name], [name] ([id number]) [id number] Page 6 of 8 looking better today. 3B-26-24 upon evaluation today patient appears to be doing well currently in regard to her wounds all things considered I definitely see signs of improvement which is great news. Fortunately there does not appear to be any evidence of active infection locally nor systemically which is great news. No fevers, chills, nausea, vomiting, or diarrhea. Objective Constitutional Well-nourished and well-hydrated in no acute distress. Vitals Time Taken: 2:19 PM, Height: 62 in, Weight: 162 lbs, BMI: 29.6, Temperature: 98.2 F, Pulse: 85 bpm, Respiratory Rate: 18 breaths/min, Blood Pressure: 126/76 mmHg. Respiratory normal breathing without difficulty. Psychiatric this patient is able to make decisions and demonstrates good insight into disease process. Alert and Oriented x 3. pleasant and cooperative. General Notes: Upon inspection patient's wound bed  actually showed signs of good granulation and epithelization at this point. Overall I am extremely pleased with where we stand and I do believe that the patient is making good progress and I think that week by week we are seeing this improvement in the overall appearance of the wound she is extremely happy with where we are and I agree that this does seem to be doing quite well. Integumentary (Hair, Skin) Wound #1 status is Open. Original cause of wound was Gradually Appeared. The date acquired was: 12/29/2022. The wound has been in treatment 4 weeks. The wound is located on the Right,Lateral T Great. The wound measures 3.3cm length  x 1.9cm width x 0.1cm depth; 4.924cm^2 area and 0.492cm^3 volume. oe There is Fat Layer (Subcutaneous Tissue) exposed. There is no tunneling or undermining noted. There is a medium amount of serosanguineous drainage noted. There is medium (34-66%) red, pink granulation within the wound bed. There is a medium (34-66%) amount of necrotic tissue within the wound bed including Adherent Slough. Wound #2 status is Open. Original cause of wound was Pressure Injury. The date acquired was: 12/29/2022. The wound has been in treatment 4 weeks. The wound is located on the Right,Lateral Foot. The wound measures 1.5cm length x 1.4cm width x 0.1cm depth; 1.649cm^2 area and 0.165cm^3 volume. There is Fat Layer (Subcutaneous Tissue) exposed. There is no tunneling or undermining noted. There is a medium amount of serosanguineous drainage noted. There is medium (34-66%) red, pink granulation within the wound bed. There is a medium (34-66%) amount of necrotic tissue within the wound bed including Adherent Slough. Wound #3 status is Open. Original cause of wound was Pressure Injury. The date acquired was: 12/29/2022. The wound has been in treatment 4 weeks. The wound is located on the Right,Lateral T Fifth. The wound measures 0.1cm length x 0.1cm width x 0.1cm depth; 0.008cm^2 area and 0.001cm^3  volume. There oe is no tunneling or undermining noted. There is a none present amount of drainage noted. There is no granulation within the wound bed. There is no necrotic tissue within the wound bed. Wound #4 status is Open. Original cause of wound was Pressure Injury. The date acquired was: 12/29/2022. The wound has been in treatment 4 weeks. The wound is located on the Right,Lateral T Fourth. The wound measures 0.1cm length x 0.1cm width x 0.1cm depth; 0.008cm^2 area and 0.001cm^3 volume. oe There is no tunneling or undermining noted. There is a none present amount of drainage noted. There is no granulation within the wound bed. There is no necrotic tissue within the wound bed. 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 There was a Excisional Skin/Subcutaneous oe Tissue Debridement with a total area of 6.27 sq cm performed by Tommie Sams., PA-C. With the following instrument(s): Curette to remove Viable and Non- Viable tissue/material. Material removed includes Callus, Subcutaneous Tissue, and Slough after achieving pain control using Lidocaine 4% Topical Solution. No specimens were taken. A time out was conducted at 14:45, prior to the start of the procedure. A Moderate amount of bleeding was controlled with Pressure. The procedure was tolerated well. Post Debridement Measurements: 3.3cm length x 1.9cm width x 0.1cm depth; 0.492cm^3 volume. Character of Wound/Ulcer Post Debridement is stable. Post procedure Diagnosis Wound #1: Same as Pre-Procedure [name], [name] (:[id number])  [phone] Page 7 of 8 Wound #2 Pre-procedure diagnosis of Wound #2 is a Neuropathic Ulcer-Non Diabetic located on the Right,Lateral Foot . There was a Excisional Skin/Subcutaneous Tissue Debridement with a total area of 2.1 sq cm performed by Tommie Sams., PA-C. With the following instrument(s): Curette to remove Viable and Non-Viable tissue/material. Material removed includes Subcutaneous Tissue and Slough and after achieving pain control using Lidocaine 4% T opical Solution. No specimens were taken. A time out was conducted at 14:44, prior to the start of the procedure. A Minimum amount  of bleeding was controlled with Pressure. The procedure was tolerated well. Post Debridement Measurements: 1.5cm length x 1.4cm width x 0.1cm depth; 0.165cm^3 volume. Character of Wound/Ulcer Post Debridement is stable. Post procedure Diagnosis Wound #2: Same as Pre-Procedure Plan Follow-up Appointments: Return Appointment in 1 week. Bathing/ Shower/ Hygiene: Wash wounds with antibacterial soap and water. Anesthetic (Use 'Patient Medications' Section for Anesthetic Order Entry): Lidocaine applied to wound bed WOUND #1: - T Great Wound Laterality: Right, Lateral oe Cleanser: [employer]ram Ancillary Kit - 15 Day Supply (Generic) 1 x Per Day/30 Days Discharge Instructions: Use supplies as instructed; Kit contains: (15) Saline Bullets; (15) 3x3 Gauze; 15 pr Gloves Cleanser: Soap and Water 1 x Per Day/30 Days Discharge Instructions: Gently cleanse wound with antibacterial soap, rinse and pat dry prior to dressing wounds Prim Dressing: Gauze 1 x Per Day/30 Days ary Discharge Instructions: As directed: dry Prim Dressing: Hydrofera Blue Ready Transfer Foam, 2.5x2.5 (in/in) 1 x Per Day/30 Days ary Discharge Instructions: Apply Hydrofera Blue Ready to wound bed as directed Secondary Dressing: Conforming Guaze Roll-Small 1 x Per Day/30 Days Discharge Instructions: Apply Conforming Stretch  Guaze Bandage as directed Secured With: Medipore T - 48M Medipore H Soft Cloth Surgical T ape ape, 2x2 (in/yd) (Generic) 1 x Per Day/30 Days WOUND #2: - Foot Wound Laterality: Right, Lateral Cleanser: Soap and Water 1 x Per Day/30 Days Discharge Instructions: Gently cleanse wound with antibacterial soap, rinse and pat dry prior to dressing wounds Prim Dressing: Xeroform-HBD 2x2 (in/in) (Generic) 1 x Per Day/30 Days ary Discharge Instructions: Apply Xeroform-HBD 2x2 (in/in) as directed Secondary Dressing: Conforming Guaze Roll-Small (Generic) 1 x Per Day/30 Days Discharge Instructions: Apply Conforming Stretch Guaze Bandage as directed Secured With: Medipore T - 48M Medipore H Soft Cloth Surgical T ape ape, 2x2 (in/yd) 1 x Per Day/30 Days WOUND #3: - T Fifth Wound Laterality: Right, Lateral oe Cleanser: Soap and Water 1 x Per Day/30 Days Discharge Instructions: Gently cleanse wound with antibacterial soap, rinse and pat dry prior to dressing wounds Prim Dressing: Xeroform-HBD 2x2 (in/in) (Generic) 1 x Per Day/30 Days ary Discharge Instructions: Apply Xeroform-HBD 2x2 (in/in) as directed Secondary Dressing: Conforming Guaze Roll-Small (Generic) 1 x Per Day/30 Days Discharge Instructions: Apply Conforming Stretch Guaze Bandage as directed Secured With: Medipore T - 48M Medipore H Soft Cloth Surgical T ape ape, 2x2 (in/yd) 1 x Per Day/30 Days WOUND #4: - T Fourth Wound Laterality: Right, Lateral oe Cleanser: Soap and Water 1 x Per Day/30 Days Discharge Instructions: Gently cleanse wound with antibacterial soap, rinse and pat dry prior to dressing wounds Prim Dressing: Xeroform-HBD 2x2 (in/in) (Generic) 1 x Per Day/30 Days ary Discharge Instructions: Apply Xeroform-HBD 2x2 (in/in) as directed Secondary Dressing: Conforming Guaze Roll-Small (Generic) 1 x Per Day/30 Days Discharge Instructions: Apply Conforming Stretch Guaze Bandage as directed Secured With: Medipore T - 48M Medipore H Soft  Cloth Surgical T ape ape, 2x2 (in/yd) 1 x Per Day/30 Days 1. I am going to recommend currently that we have the patient continue to monitor any signs of infection or worsening. Based on what I am seeing I do believe that we are on the right track here. With that being said I am going to stick with the [facility] on the great toe for now. And Xeroform to the lateral foot. 2. I am also can recommend at this point that we have the patient continue with the roll gauze to secure in place. 3. I am also can recommend that the  patient should continue to monitor for any signs of worsening if obviously anything changes she should let me know as quickly as possible. We will see patient back for reevaluation in 1 week here in the clinic. If anything worsens or changes patient will contact our office for additional recommendations. Electronic Signature(s) Signed: 03/17/2023 5:37:05 PM By: Worthy Keeler PA-C Entered By: Worthy Keeler on 03/17/2023 17:37:04 -------------------------------------------------------------------------------- SuperBill Details Patient Name: Date of Service: [name], [place] 03/17/2023 Medical Record Number: [id number] Patient Account Number: [id number] Date of Birth/Sex: Treating RN: 28-Jul-1951 (72 y.o. 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 Z79.01 Long term (current) use of anticoagulants J44.9 Chronic obstructive pulmonary disease, unspecified I25.10 Atherosclerotic heart disease of native coronary artery without angina pectoris Facility Procedures : CPT4 Code: [id number] Description: [id number]  - DEB SUBQ TISSUE 20 SQ CM/< ICD-10 Diagnosis Description L97.512 Non-pressure chronic ulcer of other part of right foot with fat layer exposed Modifier: Quantity: 1 Physician Procedures : CPT4 Code Description Modifier [id number] - WC PHYS SUBQ TISS 20 SQ CM ICD-10 Diagnosis Description L97.512 Non-pressure chronic ulcer of other part of right foot with fat layer exposed Quantity: 1 Electronic Signature(s) Signed: 03/17/2023 5:37:18 PM By: Worthy Keeler PA-C Entered By: Worthy Keeler on 03/17/2023 17:37:17

## 2023-03-24 ENCOUNTER — Encounter: Payer: Medicare Other | Attending: Physician Assistant | Admitting: Physician Assistant

## 2023-03-24 DIAGNOSIS — L97512 Non-pressure chronic ulcer of other part of right foot with fat layer exposed: Secondary | ICD-10-CM | POA: Insufficient documentation

## 2023-03-24 DIAGNOSIS — I2510 Atherosclerotic heart disease of native coronary artery without angina pectoris: Secondary | ICD-10-CM | POA: Insufficient documentation

## 2023-03-24 DIAGNOSIS — Z7901 Long term (current) use of anticoagulants: Secondary | ICD-10-CM | POA: Insufficient documentation

## 2023-03-24 DIAGNOSIS — G603 Idiopathic progressive neuropathy: Secondary | ICD-10-CM | POA: Diagnosis not present

## 2023-03-24 NOTE — Progress Notes (Signed)
Taylor, [name] ([place]:[id number]) [id number] Page 1 of 12 Visit Report for 03/24/2023 Arrival Information Details Patient Name: Date of Service: KARNA, DOWNHOUR 03/24/2023 2:00 PM Medical Record Number: [id number] Patient Account Number: [id number] Date of Birth/Sex: Treating RN: 07-30-1951 (72 y.o. [name] Primary Care Pamelia Botto: PA [place], [place] Other Clinician: Referring Blanca Carreon: Treating Alisen Marsiglia/Extender: Luiz Ochoa in Treatment: 5 Visit Information History Since Last Visit Added or deleted any medications: No Patient Arrived: Wheel Chair Any new allergies or adverse reactions: No Arrival Time: 14:22 Had a fall or experienced change in No Accompanied By: daughter activities of daily living that may affect Transfer Assistance: EasyPivot Patient Lift risk of falls: Patient Identification Verified: Yes Hospitalized since last visit: No Secondary Verification Process Completed: Yes Has Dressing in Place as Prescribed: Yes Patient Requires Transmission-Based Precautions: No Has Footwear/Offloading in Place as Prescribed: Yes Patient Has Alerts: Yes Right: Other:surgical shoe Patient Alerts: Patient on Blood Thinner Pain Present Now: No PLAVIX ASA 81 mg R ABI 0.70 TBI 0.34 L ABI 0.76 TBI 0.00 Electronic Signature(s) Signed: 03/24/2023 3:15:32 PM By: Levora Dredge Entered By: Levora Dredge on 03/24/2023 15:15:32 -------------------------------------------------------------------------------- Clinic Level of Care Assessment Details Patient Name: Date of Service: BEATRYCE, SISTRUNK 03/24/2023 2:00 PM Medical Record Number: [id number] Patient Account Number: [id number] Date of Birth/Sex: Treating RN: [birth year] (72 y.o. [name] Primary Care Devan Babino: PA [place], [place] Other Clinician: Referring Ricci Paff: Treating Amarrion Pastorino/Extender: Luiz Ochoa in Treatment: 5 Clinic Level of Care Assessment Items TOOL 4  Quantity Score []  - 0 Use when only an EandM is performed on FOLLOW-UP visit ASSESSMENTS - Nursing Assessment / Reassessment X- 1 10 Reassessment of Co-morbidities (includes updates in patient status) X- 1 5 Reassessment of Adherence to Treatment Plan ASSESSMENTS - Wound and Skin A ssessment / Reassessment []  - 0 Simple Wound Assessment / Reassessment - one wound X- 4 5 Complex Wound Assessment / Reassessment - multiple wounds []  - 0 Dermatologic / Skin Assessment (not related to wound area) ASSESSMENTS - Focused Assessment []  - 0 Circumferential Edema Measurements - multi extremities []  - 0 Nutritional Assessment / Counseling / Intervention []  - 0 Lower Extremity Assessment (monofilament, tuning fork, pulses) []  - 0 Peripheral Arterial Disease Assessment (using hand held doppler) ASSESSMENTS - Ostomy and/or Continence Assessment and Care []  - 0 Incontinence Assessment and Management []  - 0 Ostomy Care Assessment and Management (repouching, etc.) PROCESS - Coordination of Care X - Simple Patient / Family Education for ongoing care 1 15 [place], [place] ([id number]) [phone].pdf Page 2 of 12 []  - 0 Complex (extensive) Patient / Family Education for ongoing care X- 1 10 Staff obtains Programmer, systems, Records, T Results / Process Orders est []  - 0 Staff telephones HHA, Nursing Homes / Clarify orders / etc []  - 0 Routine Transfer to another Facility (non-emergent condition) []  - 0 Routine Hospital Admission (non-emergent condition) []  - 0 New Admissions / Biomedical engineer / Ordering NPWT Apligraf, etc. , []  - 0 Emergency Hospital Admission (emergent condition) X- 1 10 Simple Discharge Coordination []  - 0 Complex (extensive) Discharge Coordination PROCESS - Special Needs []  - 0 Pediatric / Minor Patient Management []  - 0 Isolation Patient Management []  - 0 Hearing / Language / Visual special needs []  - 0 Assessment of Community assistance  (transportation, D/C planning, etc.) []  - 0 Additional assistance / Altered mentation []  - 0 Support Surface(s) Assessment (bed, cushion, seat, etc.) INTERVENTIONS - Wound Cleansing / Measurement []  - 0 Simple Wound  Cleansing - one wound X- 4 5 Complex Wound Cleansing - multiple wounds X- 1 5 Wound Imaging (photographs - any number of wounds) []  - 0 Wound Tracing (instead of photographs) []  - 0 Simple Wound Measurement - one wound X- 4 5 Complex Wound Measurement - multiple wounds INTERVENTIONS - Wound Dressings X - Small Wound Dressing one or multiple wounds 2 10 []  - 0 Medium Wound Dressing one or multiple wounds []  - 0 Large Wound Dressing one or multiple wounds X- 1 5 Application of Medications - topical []  - 0 Application of Medications - injection INTERVENTIONS - Miscellaneous []  - 0 External ear exam []  - 0 Specimen Collection (cultures, biopsies, blood, body fluids, etc.) []  - 0 Specimen(s) / Culture(s) sent or taken to Lab for analysis []  - 0 Patient Transfer (multiple staff / Civil Service fast streamer / Similar devices) []  - 0 Simple Staple / Suture removal (25 or less) []  - 0 Complex Staple / Suture removal (26 or more) []  - 0 Hypo / Hyperglycemic Management (close monitor of Blood Glucose) []  - 0 Ankle / Brachial Index (ABI) - do not check if billed separately X- 1 5 Vital Signs Has the patient been seen at the hospital within the last three years: Yes Total Score: 145 Level Of Care: New/Established - Level 4 Electronic Signature(s) Signed: 03/24/2023 4:27:12 PM By: Levora Dredge Entered By: Levora Dredge on 03/24/2023 15:09:10 [name] ([id number].pdf Page 3 of 12 -------------------------------------------------------------------------------- Encounter Discharge Information Details Patient Name: Date of Service: EMALEA, ARO 03/24/2023 2:00 PM Medical Record Number: [id number] Patient Account Number: [id number] Date of  Birth/Sex: Treating RN: 03-13-1951 (72 y.o. [name] Primary Care Anastaisa Wooding: PA [place], [place] Other Clinician: Referring Mikia Delaluz: Treating Happy Begeman/Extender: Luiz Ochoa in Treatment: 5 Encounter Discharge Information Items Discharge Condition: Stable Ambulatory Status: Wheelchair Discharge Destination: Home Transportation: Private Auto Accompanied By: daughter Schedule Follow-up Appointment: Yes Clinical Summary of Care: Electronic Signature(s) Signed: 03/24/2023 3:13:21 PM By: Levora Dredge Entered By: Levora Dredge on 03/24/2023 15:13:21 -------------------------------------------------------------------------------- Lower Extremity Assessment Details Patient Name: Date of Service: JATERIA, FRANCESCO 03/24/2023 2:00 PM Medical Record Number: [id number] Patient Account Number: [id number] Date of Birth/Sex: Treating RN: [birth year] (72 y.o. [name] Primary Care Jacarie Pate: PA [place], [place] Other Clinician: Referring Arjay Jaskiewicz: Treating Haston Casebolt/Extender: Luiz Ochoa in Treatment: 5 Edema Assessment Assessed: [Left: No] [Right: No] Edema: [Left: Ye] [Right: s] Calf Left: Right: Point of Measurement: From Medial Instep 35.1 cm Ankle Left: Right: Point of Measurement: From Medial Instep 23.5 cm Vascular Assessment Pulses: Dorsalis Pedis Palpable: [Right:Yes] Electronic Signature(s) Signed: 03/24/2023 4:27:12 PM By: Levora Dredge Entered By: Levora Dredge on 03/24/2023 14:36:30 -------------------------------------------------------------------------------- Multi Wound Chart Details Patient Name: Date of Service: [name], [place] 03/24/2023 2:00 PM Medical Record Number: [id number] Patient Account Number: [id number] Date of Birth/Sex: Treating RN: Dec 21, 1951 (72 y.o. [name] Primary Care Shondra Capps: PA [place], [place] Other Clinician: Referring Katurah Karapetian: Treating Rosalene Wardrop/Extender: Luiz Ochoa in  Treatment: [place], [place] ([id number]) [id number] Page 4 of 12 Vital Signs Height(in): 62 Pulse(bpm): 75 Weight(lbs): 162 Blood Pressure(mmHg): 116/88 Body Mass Index(BMI): 29.6 Temperature(F): 97.8 Respiratory Rate(breaths/min): 18 [1:Photos: No Photos Right, Lateral T Great oe Wound Location: Gradually Appeared Wounding Event: Neuropathic Ulcer-Non Diabetic Primary Etiology: Asthma, Chronic Obstructive Comorbid History: Pulmonary Disease (COPD), Coronary Pulmonary Disease (COPD), Coronary  Pulmonary Disease (COPD), Coronary Artery Disease 12/29/2022 Date Acquired: 5 Weeks of Treatment: Open Wound Status: No Wound Recurrence: Yes Pending A mputation  on Presentation: 3.8x1.5x0.1 Measurements L x W x D (cm) 4.477 A (cm) : rea 0.448 Volume  (cm) : 15.50% % Reduction in A rea: 57.70% % Reduction in Volume: Starting Position 1 (o'clock): Ending Position 1 (o'clock): Maximum Distance 1 (cm): No Undermining: Full Thickness Without Exposed Classification: Support Structures Medium Exudate  Amount: Serosanguineous Exudate Type: red, brown Exudate Color: Medium (34-66%) Granulation Amount: Red, Pink Granulation Quality: Medium (34-66%) Necrotic Amount: Fat Layer (Subcutaneous Tissue): Yes Fat Layer (Subcutaneous Tissue): Yes Fascia: No  Exposed Structures: Fascia: No Tendon: No Muscle: No Joint: No Bone: No None Epithelialization:] [2:No Photos Right, Lateral Foot Pressure Injury Neuropathic Ulcer-Non Diabetic Asthma, Chronic Obstructive Artery Disease 12/29/2022 5 Open No Yes 1.6x1.1x0.1  1.382 0.138 41.30% 70.70% 6 12 0.2 Yes Full Thickness Without Exposed Support Structures Medium Serosanguineous red, brown Medium (34-66%) Red, Pink Medium (34-66%) Fascia: No Tendon: No Muscle: No Joint: No Bone: No None] [3:No Photos Right, Lateral T  Fifth oe Pressure Injury Neuropathic Ulcer-Non Diabetic Asthma, Chronic Obstructive Artery Disease 12/29/2022 5 Healed - Epithelialized No Yes  0x0x0 0 0 100.00% 100.00% No Unclassifiable None Present N/A N/A None Present (0%) N/A None Present (0%) Fat  Layer (Subcutaneous Tissue): No Tendon: No Muscle: No Joint: No Bone: No Large (67-100%)] Wound Number: 4 N/A N/A Photos: No Photos N/A N/A Right, Lateral T Fourth oe N/A N/A Wound Location: Pressure Injury N/A N/A Wounding Event: Neuropathic Ulcer-Non Diabetic N/A N/A Primary Etiology: Asthma, Chronic Obstructive N/A N/A Comorbid History: Pulmonary Disease (COPD), Coronary Artery Disease 12/29/2022 N/A N/A Date Acquired: 5 N/A N/A Weeks of Treatment: Healed - Epithelialized N/A N/A Wound Status: No N/A N/A Wound Recurrence: Yes N/A N/A Pending A mputation on Presentation: 0x0x0 N/A N/A Measurements L x W x D (cm) 0 N/A N/A A (cm) : rea 0 N/A N/A Volume (cm) : 100.00% N/A N/A % Reduction in A rea: 100.00% N/A N/A % Reduction in Volume: No N/A N/A Undermining: Partial Thickness N/A N/A Classification: None Present N/A N/A Exudate A mount: N/A N/A N/A Exudate Type: N/A N/A N/A Exudate Color: None Present (0%) N/A N/A Granulation A mount: N/A N/A N/A Granulation Quality: None Present (0%) N/A N/A Necrotic A mount: Fascia: No N/A N/A Exposed Structures: Fat Layer (Subcutaneous Tissue): No Tendon: No Muscle: No Joint: No Bone: No Large (67-100%) N/A N/A EpithelializationLeland Leidel, Jaileen ([id number] Page 5 of 12 Treatment Notes Electronic Signature(s) Signed: 03/24/2023 4:27:12 PM By: Levora Dredge Entered By: Levora Dredge on 03/24/2023 14:46:20 -------------------------------------------------------------------------------- Multi-Disciplinary Care Plan Details Patient Name: Date of Service: [name], [place] 03/24/2023 2:00 PM Medical Record Number: [id number] Patient Account Number: [id number] Date of Birth/Sex: Treating RN: 27-Dec-1950 (71 y.o. [name] Primary Care Lovene Maret: PA [place], [place] Other  Clinician: Referring Arfa Lamarca: Treating Charm Stenner/Extender: Luiz Ochoa in Treatment: 5 Active Inactive Wound/Skin Impairment Nursing Diagnoses: Impaired tissue integrity Knowledge deficit related to ulceration/compromised skin integrity Goals: Patient will have a decrease in wound volume by X% from date: (specify in notes) Date Initiated: 02/16/2023 Target Resolution Date: 04/20/2023 Goal Status: Active Patient/caregiver will verbalize understanding of skin care regimen Date Initiated: 02/16/2023 Date Inactivated: 03/03/2023 Target Resolution Date: 03/17/2023 Goal Status: Met Ulcer/skin breakdown will have a volume reduction of 30% by week 4 Date Initiated: 02/16/2023 Date Inactivated: 03/24/2023 Target Resolution Date: 03/17/2023 Goal Status: Met Ulcer/skin breakdown will have a volume reduction of 50% by week 8 Date Initiated: 02/16/2023 Target Resolution Date: 04/17/2023 Goal Status: Active Ulcer/skin breakdown will have  a volume reduction of 80% by week 12 Date Initiated: 02/16/2023 Target Resolution Date: 05/17/2023 Goal Status: Active Interventions: Assess patient/caregiver ability to obtain necessary supplies Assess patient/caregiver ability to perform ulcer/skin care regimen upon admission and as needed Assess ulceration(s) every visit Provide education on ulcer and skin care Treatment Activities: Referred to DME Smrithi Pigford for dressing supplies : 02/16/2023 Skin care regimen initiated : 02/16/2023 Notes: Electronic Signature(s) Signed: 03/24/2023 3:12:37 PM By: Levora Dredge Previous Signature: 03/24/2023 3:10:42 PM Version By: Levora Dredge Entered By: Levora Dredge on 03/24/2023 15:12:36 -------------------------------------------------------------------------------- Pain Assessment Details Patient Name: Date of Service: [name], [place] 03/24/2023 2:00 PM Medical Record Number: [id number] Patient Account Number: [id number] Date of Birth/Sex: Treating  RN: 05/11/1951 (72 y.o. [name] Primary Care Danna Sewell: PA [place], [place] Other Clinician: Referring Daden Mahany: Treating Dieter Hane/Extender: Luiz Ochoa in Treatment: [place], [place] ([id number]) [id number] Page 6 of 12 Active Problems Location of Pain Severity and Description of Pain Patient Has Paino No Site Locations Rate the pain. Current Pain Level: 0 Pain Management and Medication Current Pain Management: Electronic Signature(s) Signed: 03/24/2023 4:27:12 PM By: Levora Dredge Entered By: Levora Dredge on 03/24/2023 14:23:46 -------------------------------------------------------------------------------- Patient/Caregiver Education Details Patient Name: Date of Service: [name], [place] 4/2/2024andnbsp2:00 PM Medical Record Number: [id number] Patient Account Number: [id number] Date of Birth/Gender: Treating RN: June 21, 1951 (72 y.o. [name] Primary Care Physician: PA [place], [place] Other Clinician: Referring Physician: Treating Physician/Extender: Luiz Ochoa in Treatment: 5 Education Assessment Education Provided To: Patient Education Topics Provided Pressure: Handouts: Pressure Injury: Prevention and Offloading Methods: Explain/Verbal Responses: State content correctly Wound Debridement: Handouts: Wound Debridement Methods: Explain/Verbal Responses: State content correctly Wound/Skin Impairment: Handouts: Caring for Your Ulcer Methods: Explain/Verbal Responses: State content correctly Electronic Signature(s) Signed: 03/24/2023 4:27:12 PM By: Levora Dredge Entered By: Levora Dredge on 03/24/2023 15:11:10 [name] ([id number] Page 7 of 12 -------------------------------------------------------------------------------- Wound Assessment Details Patient Name: Date of Service: AUBRIEGH, CELEDON 03/24/2023 2:00 PM Medical Record Number:  [id number] Patient Account Number: [id number] Date of Birth/Sex: Treating RN: 24-Oct-1951 (72 y.o. [name] Primary Care Davin Muramoto: PA [place], [place] Other Clinician: Referring Adrean Heitz: Treating Krystyna Cleckley/Extender: Luiz Ochoa in Treatment: 5 Wound Status Wound Number: 1 Primary Neuropathic Ulcer-Non Diabetic Etiology: Wound Location: Right, Lateral T Great oe Wound Open Wounding Event: Gradually Appeared Status: Date Acquired: 12/29/2022 Comorbid Asthma, Chronic Obstructive Pulmonary Disease (COPD), Weeks Of Treatment: 5 History: Coronary Artery Disease Clustered Wound: No Pending Amputation On Presentation Photos Wound Measurements Length: (cm) 3.8 Width: (cm) 1.5 Depth: (cm) 0.1 Area: (cm) 4.477 Volume: (cm) 0.448 % Reduction in Area: 15.5% % Reduction in Volume: 57.7% Epithelialization: None Tunneling: No Undermining: No Wound Description Classification: Full Thickness Without Exposed Suppor Exudate Amount: Medium Exudate Type: Serosanguineous Exudate Color: red, brown t Structures Foul Odor After Cleansing: No Slough/Fibrino Yes Wound Bed Granulation Amount: Medium (34-66%) Exposed Structure Granulation Quality: Red, Pink Fascia Exposed: No Necrotic Amount: Medium (34-66%) Fat Layer (Subcutaneous Tissue) Exposed: Yes Necrotic Quality: Adherent Slough Tendon Exposed: No Muscle Exposed: No Joint Exposed: No Bone Exposed: No Treatment Notes Wound #1 (Toe Great) Wound Laterality: Right, Lateral Cleanser [place] Ancillary Kit - 15 Day Supply Discharge Instruction: Use supplies as instructed; Kit contains: (15) Saline Bullets; (15) 3x3 Gauze; 15 pr Gloves Soap and Water Discharge Instruction: Gently cleanse wound with antibacterial soap, rinse and pat dry prior to dressing wounds Peri-Wound Care Topical Primary Dressing Gauze Discharge Instruction: As directed: dry, over blue Hydrofera Blue  Ready Transfer Foam, 2.5x2.5  (in/in) Discharge Instruction: Apply Hydrofera Blue Ready to wound bed as directed Harman, Deshia ([id number]) [phone].pdf Page 8 of 12 Secondary Dressing [place] Roll-Small Discharge Instruction: Apply Conforming Stretch Guaze Bandage as directed Secured With [place] Medipore H Soft Cloth Surgical T ape ape, 2x2 (in/yd) Compression Wrap Compression Stockings Add-Ons Electronic Signature(s) Signed: 03/24/2023 3:06:41 PM By: Levora Dredge Entered By: Levora Dredge on 03/24/2023 15:06:41 -------------------------------------------------------------------------------- Wound Assessment Details Patient Name: Date of Service: [name], [place] 03/24/2023 2:00 PM Medical Record Number: [id number] Patient Account Number: [id number] Date of Birth/Sex: Treating RN: 1951/09/24 (72 y.o. [name] Primary Care Merline Perkin: PA [place], [place] Other Clinician: Referring Rane Blitch: Treating Joaquina Nissen/Extender: Luiz Ochoa in Treatment: 5 Wound Status Wound Number: 2 Primary Neuropathic Ulcer-Non Diabetic Etiology: Wound Location: Right, Lateral Foot Wound Open Wounding Event: Pressure Injury Status: Date Acquired: 12/29/2022 Comorbid Asthma, Chronic Obstructive Pulmonary Disease (COPD), Weeks Of Treatment: 5 History: Coronary Artery Disease Clustered Wound: No Pending Amputation On Presentation Photos Wound Measurements Length: (cm) 1.6 Width: (cm) 1.1 Depth: (cm) 0.1 Area: (cm) 1.382 Volume: (cm) 0.138 % Reduction in Area: 41.3% % Reduction in Volume: 70.7% Epithelialization: None Tunneling: No Undermining: Yes Starting Position (o'clock): 6 Ending Position (o'clock): 12 Maximum Distance: (cm) 0.2 Wound Description Classification: Full Thickness Without Exposed Suppor Exudate Amount: Medium Exudate Type: Serosanguineous Exudate Color: red, brown t Structures Foul Odor After Cleansing: No Slough/Fibrino Yes Wound  Bed Granulation Amount: Medium (34-66%) Exposed Structure Granulation Quality: Red, Pink Fascia Exposed: No Necrotic Amount: Medium (34-66%) Fat Layer (Subcutaneous Tissue) Exposed: Yes Baade, Alaysia ([id number] Page 9 of 12 Necrotic Quality: Adherent Slough Tendon Exposed: No Muscle Exposed: No Joint Exposed: No Bone Exposed: No Treatment Notes Wound #2 (Foot) Wound Laterality: Right, Lateral Cleanser Soap and Water Discharge Instruction: Gently cleanse wound with antibacterial soap, rinse and pat dry prior to dressing wounds Peri-Wound Care Topical Primary Dressing Gauze Discharge Instruction: As directed: dry, over blue Hydrofera Blue Ready Transfer Foam, 2.5x2.5 (in/in) Discharge Instruction: Apply Hydrofera Blue Ready to wound bed as directed Secondary Dressing Conforming [place] Roll-Small Discharge Instruction: [place] as directed Secured With [place] Soft Cloth Surgical T ape ape, 2x2 (in/yd) Compression Wrap Compression Stockings Add-Ons Electronic Signature(s) Signed: 03/24/2023 3:07:07 PM By: Levora Dredge Entered By: Levora Dredge on 03/24/2023 15:07:07 -------------------------------------------------------------------------------- Wound Assessment Details Patient Name: Date of Service: [name], [place] 03/24/2023 2:00 PM Medical Record Number: [id number] Patient Account Number: [id number] Date of Birth/Sex: Treating RN: 1951-07-19 (72 y.o. [name] Primary Care Jarrette Dehner: PA [place], [place] Other Clinician: Referring Hibba Schram: Treating Melitta Tigue/Extender: Luiz Ochoa in Treatment: 5 Wound Status Wound Number: 3 Primary Neuropathic Ulcer-Non Diabetic Etiology: Wound Location: Right, Lateral T Fifth oe Wound Healed - Epithelialized Wounding Event: Pressure Injury Status: Date Acquired: 12/29/2022 Comorbid Asthma, Chronic Obstructive Pulmonary  Disease (COPD), Weeks Of Treatment: 5 History: Coronary Artery Disease Clustered Wound: No Pending Amputation On Presentation Photos [place], [place] ([id number]) [id number] Page 10 of 12 Wound Measurements Length: (cm) Width: (cm) Depth: (cm) Area: (cm) Volume: (cm) 0 % Reduction in Area: 100% 0 % Reduction in Volume: 100% 0 Epithelialization: Large (67-100%) 0 Tunneling: No 0 Undermining: No Wound Description Classification: Unclassifiable Exudate Amount: None Present Foul Odor After Cleansing: No Slough/Fibrino No Wound Bed Granulation Amount: None Present (0%) Exposed Structure Necrotic Amount: None Present (0%) Fascia Exposed: No Fat Layer (Subcutaneous Tissue) Exposed: No Tendon Exposed:  No Muscle Exposed: No Joint Exposed: No Bone Exposed: No Treatment Notes Wound #3 (Toe Fifth) Wound Laterality: Right, Lateral Cleanser Peri-Wound Care Topical Primary Dressing Secondary Dressing Secured With Compression Wrap Compression Stockings Add-Ons Electronic Signature(s) Signed: 03/24/2023 3:07:41 PM By: Levora Dredge Entered By: Levora Dredge on 03/24/2023 15:07:40 -------------------------------------------------------------------------------- Wound Assessment Details Patient Name: Date of Service: [name], [place] 03/24/2023 2:00 PM Medical Record Number: [id number] Patient Account Number: [id number] Date of Birth/Sex: Treating RN: Sep 13, 1951 (72 y.o. [name] Primary Care Jurrell Royster: PA [place], [place] Other Clinician: Referring Daquavion Catala: Treating Yisel Megill/Extender: Luiz Ochoa in Treatment: 5 Wound Status Wound Number: 4 Primary Neuropathic Ulcer-Non Diabetic Etiology: Wound Location: Right, Lateral T Fourth oe Wound Healed - Epithelialized Wounding Event: Pressure Injury Status: Date Acquired: 12/29/2022 Comorbid Asthma, Chronic Obstructive Pulmonary Disease (COPD), Weeks Of Treatment: 5 History:  Coronary Artery Disease Clustered Wound: No Pending Amputation On Presentation Photos Motley, Celestine ([id number]) [id number] Page 11 of 12 Wound Measurements Length: (cm) Width: (cm) Depth: (cm) Area: (cm) Volume: (cm) 0 % Reduction in Area: 100% 0 % Reduction in Volume: 100% 0 Epithelialization: Large (67-100%) 0 Tunneling: No 0 Undermining: No Wound Description Classification: Partial Thickness Exudate Amount: None Present Foul Odor After Cleansing: No Slough/Fibrino No Wound Bed Granulation Amount: None Present (0%) Exposed Structure Necrotic Amount: None Present (0%) Fascia Exposed: No Fat Layer (Subcutaneous Tissue) Exposed: No Tendon Exposed: No Muscle Exposed: No Joint Exposed: No Bone Exposed: No Treatment Notes Wound #4 (Toe Fourth) Wound Laterality: Right, Lateral Cleanser Peri-Wound Care Topical Primary Dressing Secondary Dressing Secured With Compression Wrap Compression Stockings Add-Ons Electronic Signature(s) Signed: 03/24/2023 3:08:00 PM By: Levora Dredge Entered By: Levora Dredge on 03/24/2023 15:08:00 -------------------------------------------------------------------------------- [place] Details Patient Name: Date of Service: [name], [place] 03/24/2023 2:00 PM Medical Record Number: [id number] Patient Account Number: [id number] Date of Birth/Sex: Treating RN: [birth year] (72 y.o. [name] Primary Care Sobia Karger: PA TIENT, [place] Other Clinician: Referring Schawn Byas: Treating Lovella Hardie/Extender: Luiz Ochoa in Treatment: 5 Vital Signs Time Taken: 14:23 Temperature (F): 97.8 Height (in): 62 Pulse (bpm): 75 Weight (lbs): 162 Respiratory Rate (breaths/min): 18 Koenig, Aminah (:[id number]) [phone].pdf Page 12 of 12 Body Mass Index (BMI): 29.6 Blood Pressure (mmHg): 116/88 Reference Range: 80 - 120 mg / dl Electronic Signature(s) Signed: 03/24/2023 4:27:12 PM By:  Levora Dredge Entered By: Levora Dredge on 03/24/2023 14:23:39

## 2023-03-24 NOTE — Progress Notes (Addendum)
Lovell, Victoria Herald ([id number]) [id number] Page 1 of 6 Visit Report for 03/24/2023 Chief Complaint Document Details Patient Name: Date of Service: [name], [name] 03/24/2023 2:00 PM Medical Record Number: [id number] Patient Account Number: [id number] Date of Birth/Sex: Treating RN: Mar 05, 1951 (72 y.o. [name] Primary Care Provider: PA Zenovia Jordan, [place] Other Clinician: Referring Provider: Treating Provider/Extender: Riccardo Dubin in Treatment: 5 Information Obtained from: Patient Chief Complaint Right foot ulcers Electronic Signature(s) Signed: 03/24/2023 2:42:19 PM By: Lenda Kelp PA-C Entered By: Lenda Kelp on 03/24/2023 14:42:18 -------------------------------------------------------------------------------- HPI Details Patient Name: Date of Service: [name], [name] 03/24/2023 2:00 PM Medical Record Number: [id number] Patient Account Number: [id number] Date of Birth/Sex: Treating RN: 1951/12/22 (72 y.o. [name] Primary Care Provider: PA TIENT, [place] Other Clinician: Referring Provider: Treating Provider/Extender: Riccardo Dubin in Treatment: 5 History of Present Illness HPI Description: 02-16-2023 upon evaluation today patient presents for initial inspection here in our clinic concerning issues that she has been having with wounds over the right foot in the realm of several toes as well as the lateral portion of her foot fifth metatarsal location. Subsequently she unfortunately has somewhat poor arterial flow. I do have notes that I reviewed in epic and subsequently the patient did have an angiogram as well back in January. The testing following the angiogram revealed after stent placement on 01-09-2023 that she subsequently still had somewhat compromised arterial flow following with a right ABI of 0.70 with a TBI of 0.34 and on the left an ABI of 0.76 and the TBI could not be measured. 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 With that being said the follow-up as far as her arterial testing they did not do ABIs and TBI's but it showed that she was monophasic pretty much throughout and in general I think this still shows that there is a significant issue here and the vascular doctors seem to be in agreement as far as this is concerned they have recommended that she really needs to have repeat arterial angiogram with likely intervention to try to improve that monophasic flow. With that being said the patient is somewhat reluctant to do this though I think that she is leaning towards it even though she had a lot of bleeding issues in the groin area following the last time she had this done. In regard to her wound she is actually having quite a bit of an issue here with these wounds specifically in the realm of getting them to turn around and heal appropriately. She tells me that the areas are looking much better than they were but nonetheless she still is noticing that the Betadine seems to be keeping things a little bit dry but some areas are starting to drain a lot more unfortunately. She is on aspirin and Plavix following the stent placement. This is probably part of the bleeding issues that she had previously in the groin to be peripherally honest. She does have a history of COPD and coronary artery disease as well. 3/5; patient is in for her second clinic visit. She has significant PAD status post revascularization a month ago. According to the patient she is being scheduled for another 1 in a month's time. She has 4 wounds on the right foot lateral foot proximal foot fourth toe and the medial part of the right first toe 3/12; patient presents for follow-up. She has been using Xeroform to the wound beds. She has no issues or complaints today.  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 03-10-2023 upon evaluation today patient presents for follow-up concerning her ongoing  issues with her right lower extremity ulcers on foot. She has not seen vascular yet they called them but they have not actually gotten her scheduled. They state they are to call back and see if they can get things moving along. With that being said she does seem to be making some improvements with regard to her toes and I am very pleased in that regard I feel like the wounds are looking better today. 3B-26-24 upon evaluation today patient appears to be doing well currently in regard to her wounds all things considered I definitely see signs of improvement which is great news. Fortunately there does not appear to be any evidence of active infection locally nor systemically which is great news. No fevers, chills, nausea, vomiting, or diarrhea. 03-24-2023 upon evaluation today patient appears to be doing decently well in regard to her ulcers on her foot. Were down to just 2 and both are showing signs of improvement. Fortunately I do not see any evidence of active infection locally nor systemically which is great news. Electronic Signature(s) Signed: 03/26/2023 8:28:23 PM By: Lenda Kelp PA-C Entered By: Lenda Kelp on 03/26/2023 16:10:96 [name] ([id number]) [id number] Page 2 of 6 -------------------------------------------------------------------------------- Physical Exam Details Patient Name: Date of Service: [name], [name] 03/24/2023 2:00 PM Medical Record Number: [id number] Patient Account Number: [id number] Date of Birth/Sex: Treating RN: 1951/11/09 (72 y.o. [name] Primary Care Provider: PA Zenovia Jordan, [place] Other Clinician: Referring Provider: Treating Provider/Extender: Riccardo Dubin in Treatment: 5 Constitutional Well-nourished and well-hydrated in no acute distress. Respiratory normal breathing without difficulty. Psychiatric this patient is able to make decisions and demonstrates good insight into disease process. Alert  and Oriented x 3. pleasant and cooperative. Notes Upon inspection patient's wound bed actually showed signs of good granulation epithelization at this point. Fortunately I do not see any need for sharp debridement I do not see anything that seems to be worsening and in general I think that we are moving in the right direction. Electronic Signature(s) Signed: 03/26/2023 8:28:41 PM By: Lenda Kelp PA-C Entered By: Lenda Kelp on 03/26/2023 20:28:41 -------------------------------------------------------------------------------- Physician Orders Details Patient Name: Date of Service: [name], [name] 03/24/2023 2:00 PM Medical Record Number: [id number] Patient Account Number: [id number] Date of Birth/Sex: Treating RN: [birth year] (72 y.o. 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 Bathing/ [employer] wounds with antibacterial soap and water. Anesthetic (Use 'Patient Medications' Section for Anesthetic Order Entry) Lidocaine applied to wound bed Wound Treatment Wound #1 - T Great oe Wound Laterality: Right, Lateral Cleanser: Byram Ancillary Kit - 15 Day Supply (Generic) 1 x Per Day/30 Days Discharge Instructions: Use supplies as instructed; Kit contains: (15) Saline Bullets; (15) 3x3 Gauze; 15 pr Gloves Cleanser: Soap and Water 1 x Per Day/30 Days Discharge Instructions: Gently cleanse wound with antibacterial  soap, rinse and pat dry prior to dressing wounds Prim Dressing: Gauze 1 x Per  Day/30 Days ary Discharge Instructions: As directed: dry, over blue Prim Dressing: Hydrofera Blue Ready Transfer Foam, 2.5x2.5 (in/in) ary 1 x Per Day/30 Days CHRISTIAN, TREADWAY ([id number]) [id number] Page 3 of 6 Discharge Instructions: Apply Hydrofera Blue Ready to wound bed as directed Secondary Dressing: Conforming Guaze Roll-Small 1 x Per Day/30 Days Discharge Instructions: Apply Conforming Stretch Guaze Bandage as directed Secured With: Medipore T - 87M Medipore H Soft Cloth Surgical T ape ape, 2x2 (in/yd) (Generic) 1 x Per Day/30 Days Wound #2 - Foot Wound Laterality: Right, Lateral Cleanser: Soap and Water 1 x Per Day/30 Days Discharge Instructions: Gently cleanse wound with antibacterial soap, rinse and pat dry prior to dressing wounds Prim Dressing: Gauze 1 x Per Day/30 Days ary Discharge Instructions: As directed: dry, over blue Prim Dressing: Hydrofera Blue Ready Transfer Foam, 2.5x2.5 (in/in) 1 x Per Day/30 Days ary Discharge Instructions: Apply Hydrofera Blue Ready to wound bed as directed Secondary Dressing: Conforming Guaze Roll-Small (Generic) 1 x Per Day/30 Days Discharge Instructions: Apply Conforming Stretch Guaze Bandage as directed Secured With: Medipore T - 87M Medipore H Soft Cloth Surgical T ape ape, 2x2 (in/yd) 1 x Per Day/30 Days Electronic Signature(s) Signed: 03/24/2023 4:27:12 PM By: Angelina Pih Signed: 03/27/2023 8:47:01 AM By: Lenda Kelp PA-C Entered By: Angelina Pih on 03/24/2023 15:08:18 -------------------------------------------------------------------------------- Problem List Details Patient Name: Date of Service: [name], [name] 03/24/2023 2:00 PM Medical Record Number: [id number] Patient Account Number: [id number] Date of Birth/Sex: Treating RN: 05/27/1951 (72 y.o. 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 02/16/2023 No Yes L97.512 Non-pressure chronic ulcer of other part of right foot with fat layer exposed 02/16/2023 No Yes Z79.01 Long term (current) use of anticoagulants 02/16/2023 No Yes J44.9 Chronic obstructive pulmonary disease, unspecified 02/16/2023 No Yes I25.10 Atherosclerotic heart disease of native coronary artery without angina pectoris 02/16/2023 No Yes Inactive Problems Resolved Problems Electronic Signature(s) Signed: 03/24/2023 2:42:09 PM By: Adline Peals, [place] ([id number]) [id number] Page 4 of 6 Entered By: Lenda Kelp on 03/24/2023 14:42:09 -------------------------------------------------------------------------------- Progress Note Details Patient Name: Date of Service: [name], [name] 03/24/2023 2:00 PM Medical Record Number: [id number] Patient Account Number: [id number] Date of Birth/Sex: Treating RN: 07-02-1951 (72 y.o. [name] Primary Care Provider: PA TIENT, [place] Other Clinician: Referring Provider: Treating Provider/Extender: Riccardo Dubin in Treatment: 5 Subjective Chief Complaint Information obtained from Patient Right foot ulcers History of Present Illness (HPI) 02-16-2023 upon evaluation today patient presents for initial inspection here in our clinic concerning issues that she has been having with wounds over the right foot in the realm of several toes as well as the lateral portion of her foot fifth metatarsal location. Subsequently she unfortunately has somewhat poor arterial flow. I do have notes that I reviewed in epic and subsequently the patient did have an angiogram as well back in January. The testing following the angiogram revealed after stent  placement on 01-09-2023 that she subsequently still had somewhat compromised arterial flow following with a right ABI of 0.70 with a TBI of 0.34 and on the left an ABI of 0.76 and the TBI could not be measured. 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 With that being said the follow-up as far as her arterial testing they did not do ABIs and TBI's but it showed  that she was monophasic pretty much throughout and in general I think this still shows that there is a significant issue here and the vascular doctors seem to be in agreement as far as this is concerned they have recommended that she really needs to have repeat arterial angiogram with likely intervention to try to improve that monophasic flow. With that being said the patient is somewhat reluctant to do this though I think that she is leaning towards it even though she had a lot of bleeding issues in the groin area following the last time she had this done. In regard to her wound she is actually having quite a bit of an issue here with these wounds specifically in the realm of getting them to turn around and heal appropriately. She tells me that the areas are looking much better than they were but nonetheless she still is noticing that the Betadine seems to be keeping things a little bit dry but some areas are starting to drain a lot more unfortunately. She is on aspirin and Plavix following the stent placement. This is probably part of the bleeding issues that she had previously in the groin to be peripherally honest. She does have a history of COPD and coronary artery disease as well. 3/5; patient is in for her second clinic visit. She has significant PAD status post revascularization a month ago. According to the patient she is being scheduled for another 1 in a month's time. She has 4 wounds on the right foot lateral foot proximal foot fourth toe and the medial part of the right first toe 3/12; patient presents for follow-up. She has been using  Xeroform to the wound beds. She has no issues or complaints today. 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 03-10-2023 upon evaluation today patient presents for follow-up concerning her ongoing issues with her right lower extremity ulcers on foot. She has not seen vascular yet they called them but they have not actually gotten her scheduled. They state they are to call back and see if they can get things moving along. With that being said she does seem to be making some improvements with regard to her toes and I am very pleased in that regard I feel like the wounds are looking better today. 3B-26-24 upon evaluation today patient appears to be doing well currently in regard to her wounds all things considered I definitely see signs of improvement which is great news. Fortunately there does not appear to be any evidence of active infection locally nor systemically which is great news. No fevers, chills, nausea, vomiting, or diarrhea. 03-24-2023 upon evaluation today patient appears to be doing decently well in regard to her ulcers on her foot. Were down to just 2 and both are showing signs of improvement. Fortunately I do not see any evidence of active infection locally nor systemically which is great news. Objective Constitutional Well-nourished and well-hydrated in no acute distress. Vitals Time Taken: 2:23 PM, Height: 62 in, Weight: 162 lbs, BMI: 29.6, Temperature: 97.8 F, Pulse: 75 bpm, Respiratory Rate: 18 breaths/min, Blood Pressure: 116/88 mmHg. Respiratory normal breathing without difficulty. Psychiatric this patient is able to make decisions and demonstrates good insight into disease process. Alert and Oriented x 3. pleasant and cooperative. General Notes: Upon inspection patient's wound bed actually showed signs of good granulation epithelization at this point. Fortunately I do not see any need for sharp debridement I do not see anything that seems  to be worsening and in  general I think that we are moving in the right direction. Integumentary (Hair, Skin) Wound #1 status is Open. Original cause of wound was Gradually Appeared. The date acquired was: 12/29/2022. The wound has been in treatment 5 weeks. The wound is located on the Right,Lateral T Great. The wound measures 3.8cm length x 1.5cm width x 0.1cm depth; 4.477cm^2 area and 0.448cm^3 volume. oe There is Fat Layer (Subcutaneous Tissue) exposed. There is no tunneling or undermining noted. There is a medium amount of serosanguineous drainage noted. [name], [name] ([id number]) [id number] Page 5 of 6 There is medium (34-66%) red, pink granulation within the wound bed. There is a medium (34-66%) amount of necrotic tissue within the wound bed including Adherent Slough. Wound #2 status is Open. Original cause of wound was Pressure Injury. The date acquired was: 12/29/2022. The wound has been in treatment 5 weeks. The wound is located on the Right,Lateral Foot. The wound measures 1.6cm length x 1.1cm width x 0.1cm depth; 1.382cm^2 area and 0.138cm^3 volume. There is Fat Layer (Subcutaneous Tissue) exposed. There is no tunneling noted, however, there is undermining starting at 6:00 and ending at 12:00 with a maximum distance of 0.2cm. There is a medium amount of serosanguineous drainage noted. There is medium (34-66%) red, pink granulation within the wound bed. There is a medium (34-66%) amount of necrotic tissue within the wound bed including Adherent Slough. Wound #3 status is Healed - Epithelialized. Original cause of wound was Pressure Injury. The date acquired was: 12/29/2022. The wound has been in treatment 5 weeks. The wound is located on the Right,Lateral T Fifth. The wound measures 0cm length x 0cm width x 0cm depth; 0cm^2 area and 0cm^3 volume. There is oe no tunneling or undermining noted. There is a none present amount of drainage noted. There is no granulation  within the wound bed. There is no necrotic tissue within the wound bed. Wound #4 status is Healed - Epithelialized. Original cause of wound was Pressure Injury. The date acquired was: 12/29/2022. The wound has been in treatment 5 weeks. The wound is located on the Right,Lateral T Fourth. The wound measures 0cm length x 0cm width x 0cm depth; 0cm^2 area and 0cm^3 volume. There oe is no tunneling or undermining noted. There is a none present amount of drainage noted. There is no granulation within the wound bed. There is no necrotic tissue within the wound bed. 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 Bathing/ Shower/ Hygiene: Wash wounds with antibacterial soap and water. Anesthetic (Use 'Patient Medications' Section for Anesthetic Order Entry): Lidocaine applied to wound bed WOUND #1: - T Great Wound Laterality: Right, Lateral oe Cleanser: Byram Ancillary Kit - 15 Day Supply (Generic) 1 x Per Day/30 Days Discharge Instructions: Use supplies as instructed; Kit contains: (15) Saline Bullets; (15) 3x3 Gauze; 15 pr Gloves Cleanser: Soap and Water 1 x Per Day/30 Days Discharge Instructions: Gently cleanse wound with antibacterial soap, rinse and pat dry prior to dressing wounds Prim Dressing: Gauze 1 x Per Day/30 Days ary Discharge Instructions: As directed: dry, over blue Prim Dressing: Hydrofera Blue Ready Transfer Foam, 2.5x2.5 (in/in) 1 x Per Day/30 Days ary Discharge Instructions: Apply Hydrofera Blue Ready to wound bed as directed Secondary Dressing: Conforming Guaze Roll-Small 1 x Per Day/30  Days Discharge Instructions: Apply Conforming Stretch Guaze  Bandage as directed Secured With: Medipore T - 45M Medipore H Soft Cloth Surgical T ape ape, 2x2 (in/yd) (Generic) 1 x Per Day/30 Days WOUND #2: - Foot Wound Laterality: Right, Lateral Cleanser: Soap and Water 1 x Per Day/30 Days Discharge Instructions: Gently cleanse wound with antibacterial soap, rinse and pat dry prior to dressing wounds Prim Dressing: Gauze 1 x Per Day/30 Days ary Discharge Instructions: As directed: dry, over blue Prim Dressing: Hydrofera Blue Ready Transfer Foam, 2.5x2.5 (in/in) 1 x Per Day/30 Days ary Discharge Instructions: Apply Hydrofera Blue Ready to wound bed as directed Secondary Dressing: Conforming Guaze Roll-Small (Generic) 1 x Per Day/30 Days Discharge Instructions: Apply Conforming Stretch Guaze Bandage as directed Secured With: Medipore T - 45M Medipore H Soft Cloth Surgical T ape ape, 2x2 (in/yd) 1 x Per Day/30 Days 1. I am recommend currently that we have the patient continue to monitor for any signs of worsening or infection. Based on what I am seeing I do think that we are moving in the right direction I think that the [facility] is helping a regular continue as such with that. 2. Also can do suggest we continue with rolled gauze and secure in place. 3. I would also suggest that the patient should continue to monitor for any needs for offloading right now she seems to be doing quite well. We will see patient back for reevaluation in 1 week here in the clinic. If anything worsens or changes patient will contact our office for additional recommendations. Electronic Signature(s) Signed: 03/26/2023 8:29:10 PM By: Lenda Kelp PA-C Entered By: Lenda Kelp on 03/26/2023 20:29:10 [name] ([id number]) [id number] Page 6 of 6 -------------------------------------------------------------------------------- SuperBill Details Patient Name: Date of Service: AMADI, YOSHINO 03/24/2023 Medical Record Number:  [id number] Patient Account Number: [id number] Date of Birth/Sex: Treating RN: [birth year] (72 y.o. 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 Z79.01 Long term (current) use of anticoagulants J44.9 Chronic obstructive pulmonary disease, unspecified I25.10 Atherosclerotic heart disease of native coronary artery without angina pectoris Facility Procedures : CPT4 Code: [id number] Description: 99214 - WOUND CARE VISIT-LEV 4 EST PT Modifier: Quantity: 1 Physician Procedures : CPT4 Code Description Modifier [id number] 99213 - WC PHYS LEVEL 3 - EST PT ICD-10 Diagnosis Description 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

## 2023-03-26 ENCOUNTER — Telehealth (INDEPENDENT_AMBULATORY_CARE_PROVIDER_SITE_OTHER): Payer: Self-pay

## 2023-03-26 NOTE — Telephone Encounter (Signed)
Patient daughter left a message stating for her mother to move forward with angio they are requesting for her to admitted in the hospital for 2 days. Patient daughter express concern from previous angio with bleeding 2 days after procedure done on 01/09/2023. Patient was last seen in the office 02/09/2023. Please Advise

## 2023-03-27 NOTE — Telephone Encounter (Signed)
I spoke with Dr. Gilda Crease and he is agreeable to hold the patient overnight following her angiogram.  We will be holding her under observation.  Without any medical cause we cannot plan to hold the patient or for a longer period, however the patient will have ongoing assessment in the hospital if it is felt that she is to stay for additional time we may be able to justify that.  If the patient is agreeable we will have her move forward with angiogram.

## 2023-03-27 NOTE — Telephone Encounter (Signed)
Patient daughter was notified with medical recommendations and verbalized she will call back if they plan to proceed with scheduling angiogram.

## 2023-03-31 ENCOUNTER — Encounter: Payer: Medicare Other | Admitting: Physician Assistant

## 2023-03-31 DIAGNOSIS — G603 Idiopathic progressive neuropathy: Secondary | ICD-10-CM | POA: Diagnosis not present

## 2023-03-31 NOTE — Progress Notes (Signed)
[place], Lenise Herald ([id number]) [id number] Page 1 of 7 Visit Report for 03/31/2023 Chief Complaint Document Details Patient Name: Date of Service: [name], [name] 03/31/2023 1:00 PM Medical Record Number: [id number] Patient Account Number: [id number] Date of Birth/Sex: Treating RN: September 12, 1951 (72 y.o. [name] Primary Care Provider: PA Zenovia Jordan, [place] Other Clinician: Referring Provider: Treating Provider/Extender: Riccardo Dubin in Treatment: 6 Information Obtained from: Patient Chief Complaint Right foot ulcers Electronic Signature(s) Signed: 03/31/2023 1:20:09 PM By: Lenda Kelp PA-C Entered By: Lenda Kelp on 03/31/2023 13:20:09 -------------------------------------------------------------------------------- Debridement Details Patient Name: Date of Service: [name], [name] 03/31/2023 1:00 PM Medical Record Number: [id number] Patient Account Number: [id number] Date of Birth/Sex: Treating RN: [birth year] (72 y.o. [name] Primary Care Provider: PA Zenovia Jordan, [place] Other Clinician: Referring Provider: Treating Provider/Extender: Riccardo Dubin in Treatment: 6 Debridement Performed for Assessment: Wound #2 Right,Lateral Foot Performed By: Physician Allen Derry, PA-C Debridement Type: Debridement Level of Consciousness (Pre-procedure): Awake and Alert Pre-procedure Verification/Time Out Yes - 13:46 Taken: Pain Control: Lidocaine 4% T opical Solution T Area Debrided (L x W): otal 1.5 (cm) x 1 (cm) = 1.5 (cm) Tissue and other material debrided: Viable, Non-Viable, Slough, Subcutaneous, Slough Level: Skin/Subcutaneous Tissue Debridement Description: Excisional Instrument: Curette Bleeding: Moderate Hemostasis Achieved: Pressure Response to Treatment: Procedure was tolerated well Level of Consciousness (Post- Awake and Alert procedure): Post Debridement Measurements of Total Wound Length: (cm) 1.5 Width: (cm)  1 Depth: (cm) 0.1 Volume: (cm) 0.118 Character of Wound/Ulcer Post Debridement: Stable Post Procedure Diagnosis Same as Pre-procedure Electronic Signature(s) Unsigned Entered By: Angelina Pih on 03/31/2023 13:49:39 Rosier, Jhoanna ([id number]) [id number] Page 2 of 7 -------------------------------------------------------------------------------- Debridement Details Patient Name: Date of Service: [name], [name] 03/31/2023 1:00 PM Medical Record Number: [id number] Patient Account Number: [id number] Date of Birth/Sex: Treating RN: May 25, 1951 (72 y.o. [name] Primary Care Provider: PA Zenovia Jordan, [place] Other Clinician: Referring Provider: Treating Provider/Extender: Riccardo Dubin in Treatment: 6 Debridement Performed for Assessment: Wound #1 Right,Lateral T Great oe Performed By: Physician Allen Derry, PA-C Debridement Type: Debridement Level of Consciousness (Pre-procedure): Awake and Alert Pre-procedure Verification/Time Out Yes - 13:49 Taken: T Area Debrided (L x W): otal 3.5 (cm) x 1.6 (cm) = 5.6 (cm) Tissue and other material debrided: Viable, Non-Viable, Slough, Subcutaneous, Slough Level: Skin/Subcutaneous Tissue Debridement Description: Excisional Instrument: Curette Bleeding: Moderate Hemostasis Achieved: Pressure Response to Treatment: Procedure was tolerated well Level of Consciousness (Post- Awake and Alert procedure): Post Debridement Measurements of Total Wound Length: (cm) 3.5 Width: (cm) 1.6 Depth: (cm) 0.1 Volume: (cm) 0.44 Character of Wound/Ulcer Post Debridement: Stable Post Procedure Diagnosis Same as Pre-procedure Electronic Signature(s) Unsigned Entered By: Angelina Pih on 03/31/2023 13:50:22 -------------------------------------------------------------------------------- HPI Details Patient Name: Date of Service: [name], [name] 03/31/2023 1:00 PM Medical Record Number: [id number] Patient  Account Number: [id number] Date of Birth/Sex: Treating RN: 1951-05-13 (72 y.o. [name] Primary Care Provider: PA TIENT, [place] Other Clinician: Referring Provider: Treating Provider/Extender: Riccardo Dubin in Treatment: 6 History of Present Illness HPI Description: 02-16-2023 upon evaluation today patient presents for initial inspection here in our clinic concerning issues that she has been having with wounds over the right foot in the realm of several toes as well as the lateral portion of her foot fifth metatarsal location. Subsequently she unfortunately has somewhat poor arterial flow. I do have notes that I reviewed in epic and subsequently the patient did have an angiogram as well back in January.  The testing following the angiogram revealed after stent placement on 01-09-2023 that she subsequently still had somewhat compromised arterial flow following with a right ABI of 0.70 with a TBI of 0.34 and on the left an ABI of 0.76 and the TBI could not be measured. 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 With that being said the follow-up as far as her arterial testing they did not do ABIs and TBI's but it showed that she was monophasic pretty much throughout and in general I think this still shows that there is a significant issue here and the vascular doctors seem to be in agreement as far as this is concerned they have recommended that she really needs to have repeat arterial angiogram with likely intervention to try to improve that monophasic flow. With that being said the patient is somewhat reluctant to do this though I think that she is leaning towards it even though she had a lot of bleeding issues in the groin area following the last time she had this done. In regard to her wound she is actually having quite a bit of an issue here with these wounds specifically in the realm of getting them to turn around and heal appropriately. She tells me that the areas are  looking much better than they were but nonetheless she still is noticing that the Betadine seems to be keeping things a little bit dry but some areas are starting to drain a lot more unfortunately. She is on aspirin and Plavix following the stent placement. This is probably part of the bleeding issues that she had previously in the groin to be peripherally honest. She does have a history of COPD and coronary artery disease as well. 3/5; patient is in for her second clinic visit. She has significant PAD status post revascularization a month ago. According to the patient she is being scheduled for another 1 in a month's time. She has 4 wounds on the right foot lateral foot proximal foot fourth toe and the medial part of the right first toe 3/12; patient presents for follow-up. She has been using Xeroform to the wound beds. She has no issues or complaints today. 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 [place], Lenise Herald ([id number]) [id number] Page 3 of 7 03-10-2023 upon evaluation today patient presents for follow-up concerning her ongoing issues with her right lower extremity ulcers on foot. She has not seen vascular yet they called them but they have not actually gotten her scheduled. They state they are to call back and see if they can get things moving along. With that being said she does seem to be making some improvements with regard to her toes and I am very pleased in that regard I feel like the wounds are looking better today. 3B-26-24 upon evaluation today patient appears to be doing well currently in regard to her wounds all things considered I definitely see signs of improvement which is great news. Fortunately there does not appear to be any evidence of active infection locally nor systemically which is great news. No fevers, chills, nausea, vomiting, or diarrhea. 03-24-2023 upon evaluation today patient appears to be doing  decently well in regard to her ulcers on her foot. Were down to just 2 and both are showing signs of improvement. Fortunately I do not see any evidence of active infection locally nor systemically which is great news. 03-31-2023 upon evaluation today patient's wounds actually are showing signs of improvement. I think both are doing well  with the [facility] and I am very pleased in that regard. I do not see any signs of infection locally nor systemically which is great news. Electronic Signature(s) Signed: 03/31/2023 2:29:13 PM By: Allen Derry PA-C Entered By: Lenda Kelp on 03/31/2023 14:29:13 -------------------------------------------------------------------------------- Physical Exam Details Patient Name: Date of Service: [name], [name] 03/31/2023 1:00 PM Medical Record Number: [id number] Patient Account Number: [id number] Date of Birth/Sex: Treating RN: 1951-05-31 (72 y.o. [name] Primary Care Provider: PA TIENT, [place] Other Clinician: Referring Provider: Treating Provider/Extender: Riccardo Dubin in Treatment: 6 Notes Upon inspection patient's wound bed actually showed signs of the need for sharp debridement I did perform debridement at both locations patient tolerated that without complication and postdebridement the wound bed is significantly improved which is great news. Electronic Signature(s) Signed: 03/31/2023 2:29:29 PM By: Allen Derry PA-C Entered By: Lenda Kelp on 03/31/2023 14:29:28 -------------------------------------------------------------------------------- Physician Orders Details Patient Name: Date of Service: [name], [name] 03/31/2023 1:00 PM Medical Record Number: [id number] Patient Account Number: [id number] Date of Birth/Sex: Treating RN: [birth year] (72 y.o. 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 Bathing/ [employer] wounds with antibacterial soap and water. Anesthetic (Use 'Patient Medications' Section for Anesthetic Order Entry) Lidocaine applied to wound bed Edema Control - Lymphedema / Segmental Compressive Device / Other Elevate, Exercise Daily and Avoid Standing for Long Periods of Time. [place], Lenise Herald ([id number]) [id number] Page 4 of 7 Elevate legs to the level of the heart and pump ankles as often as possible Elevate leg(s) parallel to the floor when sitting. DO YOUR BEST to sleep in the bed at night. DO NOT sleep in your recliner. Long hours of sitting in a recliner leads to swelling of the legs and/or potential wounds on your backside. Wound Treatment Wound #1 - T Great oe Wound Laterality: Right, Lateral Cleanser: Byram Ancillary Kit - 15 Day Supply (Generic) 3 x Per Week/30 Days Discharge Instructions: Use supplies as instructed; Kit contains: (15) Saline Bullets; (15) 3x3 Gauze; 15 pr Gloves Cleanser: Soap and Water 3 x Per Week/30 Days Discharge Instructions: Gently cleanse wound with antibacterial soap, rinse and pat dry prior to dressing wounds Prim Dressing: Hydrofera Blue Ready Transfer Foam, 2.5x2.5 (in/in) 3 x Per Week/30 Days ary Discharge Instructions: Apply Hydrofera Blue Ready to wound bed as directed Secondary Dressing: Conforming Guaze Roll-Small 3 x Per Week/30 Days Discharge Instructions: Apply Conforming Stretch Guaze Bandage as directed Secondary Dressing: Gauze 3 x  Per Week/30 Days Discharge Instructions: As directed: dry, for padding Secured With: Medipore T - 60M Medipore H Soft Cloth Surgical T ape ape, 2x2 (in/yd) (Generic) 3 x Per Week/30 Days Wound #2 - Foot Wound Laterality: Right, Lateral Cleanser: Soap and Water 3 x Per Week/30 Days Discharge Instructions: Gently cleanse wound with antibacterial soap, rinse and pat dry prior to dressing wounds Prim Dressing: Hydrofera Blue Ready Transfer Foam, 2.5x2.5 (in/in) 3 x Per Week/30 Days ary Discharge Instructions: Apply Hydrofera Blue Ready to wound bed as directed Secured With: Tegaderm Film 4x4 (in/in) 3 x  Per Week/30 Days Discharge Instructions: Apply to wound bed Electronic Signature(s) Unsigned Entered By: Angelina Pih on 03/31/2023 13:52:49 -------------------------------------------------------------------------------- Problem List Details Patient Name: Date of Service: [name], [name] 03/31/2023 1:00 PM Medical Record Number: [id number] Patient Account Number: [id number] Date of Birth/Sex: Treating RN: Apr 03, 1951 (72 y.o. 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 02/16/2023 No Yes L97.512 Non-pressure chronic ulcer of other part of right foot with fat layer exposed 02/16/2023 No Yes Z79.01 Long term (current) use of anticoagulants 02/16/2023 No Yes J44.9 Chronic obstructive pulmonary disease, unspecified 02/16/2023 No Yes PRUYN, Jeanett ([id number]) [id number] Page 5 of 7 I25.10 Atherosclerotic heart disease of native coronary artery without angina pectoris 02/16/2023 No Yes Inactive Problems Resolved Problems Electronic Signature(s) Signed: 03/31/2023 1:20:03 PM By: Lenda Kelp PA-C Entered By: Lenda Kelp on  03/31/2023 13:20:02 -------------------------------------------------------------------------------- Progress Note Details Patient Name: Date of Service: [name], [name] 03/31/2023 1:00 PM Medical Record Number: [id number] Patient Account Number: [id number] Date of Birth/Sex: Treating RN: Feb 20, 1951 (72 y.o. [name] Primary Care Provider: PA TIENT, [place] Other Clinician: Referring Provider: Treating Provider/Extender: Riccardo Dubin in Treatment: 6 Subjective Chief Complaint Information obtained from Patient Right foot ulcers History of Present Illness (HPI) 02-16-2023 upon evaluation today patient presents for initial inspection here in our clinic concerning issues that she has been having with wounds over the right foot in the realm of several toes as well as the lateral portion of her foot fifth metatarsal location. Subsequently she unfortunately has somewhat poor arterial flow. I do have notes that I reviewed in epic and subsequently the patient did have an angiogram as well back in January. The testing following the angiogram revealed after stent placement on 01-09-2023 that she subsequently still had somewhat compromised arterial flow following with a right ABI of 0.70 with a TBI of 0.34 and on the left an ABI of 0.76 and the TBI could not be measured. 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 With that being said the follow-up as far as her arterial testing they did not do ABIs and TBI's but it showed that she was monophasic pretty much throughout and in general I think this still shows that there is a significant issue here and the vascular doctors seem to be in agreement as far as this is concerned they have recommended that she really needs to have repeat arterial angiogram with likely intervention to try to improve that monophasic flow. With that being said the patient is somewhat reluctant to do this though I think that she is leaning towards it even though she  had a lot of bleeding issues in the groin area following the last time she had this done. In regard to her wound she is actually having quite a bit of an issue here with these wounds specifically in the realm of getting them to turn around and heal appropriately. She tells me that the areas are looking much better than they were but nonetheless she still is noticing that the Betadine seems to be keeping things a little bit dry but some areas are starting to drain a lot more unfortunately. She is on aspirin and Plavix following the stent placement. This is probably part of the bleeding issues that she had previously in the groin to be peripherally honest. She does have  a history of COPD and coronary artery disease as well. 3/5; patient is in for her second clinic visit. She has significant PAD status post revascularization a month ago. According to the patient she is being scheduled for another 1 in a month's time. She has 4 wounds on the right foot lateral foot proximal foot fourth toe and the medial part of the right first toe 3/12; patient presents for follow-up. She has been using Xeroform to the wound beds. She has no issues or complaints today. 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 03-10-2023 upon evaluation today patient presents for follow-up concerning her ongoing issues with her right lower extremity ulcers on foot. She has not seen vascular yet they called them but they have not actually gotten her scheduled. They state they are to call back and see if they can get things moving along. With that being said she does seem to be making some improvements with regard to her toes and I am very pleased in that regard I feel like the wounds are looking better today. 3B-26-24 upon evaluation today patient appears to be doing well currently in regard to her wounds all things considered I definitely see signs of improvement which is great news. Fortunately there  does not appear to be any evidence of active infection locally nor systemically which is great news. No fevers, chills, nausea, vomiting, or diarrhea. 03-24-2023 upon evaluation today patient appears to be doing decently well in regard to her ulcers on her foot. Were down to just 2 and both are showing signs of improvement. Fortunately I do not see any evidence of active infection locally nor systemically which is great news. 03-31-2023 upon evaluation today patient's wounds actually are showing signs of improvement. I think both are doing well with the [facility] and I am very pleased in that regard. I do not see any signs of infection locally nor systemically which is great news. Objective Constitutional Vitals Time Taken: 1:05 PM, Height: 62 in, Weight: 162 lbs, BMI: 29.6, Temperature: 98.2 F, Pulse: 72 bpm, Respiratory Rate: 18 breaths/min, Blood Pressure: 142/84 mmHg. [place], Lenise Herald ([id number]) [id number] Page 6 of 7 Integumentary (Hair, Skin) Wound #1 status is Open. Original cause of wound was Gradually Appeared. The date acquired was: 12/29/2022. The wound has been in treatment 6 weeks. The wound is located on the Right,Lateral T Great. The wound measures 3.5cm length x 1.6cm width x 0.1cm depth; 4.398cm^2 area and 0.44cm^3 volume. There oe is Fat Layer (Subcutaneous Tissue) exposed. There is no tunneling or undermining noted. There is a medium amount of serosanguineous drainage noted. There is medium (34-66%) red, pink granulation within the wound bed. There is a medium (34-66%) amount of necrotic tissue within the wound bed including Adherent Slough. Wound #2 status is Open. Original cause of wound was Pressure Injury. The date acquired was: 12/29/2022. The wound has been in treatment 6 weeks. The wound is located on the Right,Lateral Foot. The wound measures 1.5cm length x 1cm width x 0.1cm depth; 1.178cm^2 area and 0.118cm^3 volume. There is Fat Layer  (Subcutaneous Tissue) exposed. There is no tunneling or undermining noted. There is a medium amount of serosanguineous drainage noted. There is large (67-100%) red, pink granulation within the wound bed. There is a small (1-33%) amount of necrotic tissue within the wound bed including Adherent Slough. 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 There was a Excisional Skin/Subcutaneous oe Tissue Debridement with a total area of 5.6 sq cm performed by Allen Derry, PA-C. With the following instrument(s): Curette to remove Viable and Non-Viable tissue/material. Material removed includes Subcutaneous Tissue and Slough and. No specimens were taken. A time out was conducted at 13:49, prior to the start of the procedure. A Moderate amount of bleeding was controlled with Pressure. The procedure was tolerated well. Post Debridement Measurements: 3.5cm length x 1.6cm width x 0.1cm depth; 0.44cm^3 volume. Character of Wound/Ulcer Post Debridement is stable. Post procedure Diagnosis Wound #1: Same as Pre-Procedure Wound #2 Pre-procedure diagnosis of Wound #2 is a Neuropathic Ulcer-Non Diabetic located on the Right,Lateral Foot . There was a Excisional Skin/Subcutaneous Tissue Debridement with a total area of 1.5 sq cm performed by Allen Derry, PA-C. With the following instrument(s): Curette to remove Viable and Non-Viable tissue/material. Material removed includes Subcutaneous Tissue and Slough and after achieving pain control using Lidocaine 4% T opical Solution. No specimens were taken. A time out was  conducted at 13:46, prior to the start of the procedure. A Moderate amount of bleeding was controlled with Pressure. The procedure was tolerated well. Post Debridement Measurements: 1.5cm length x 1cm width x 0.1cm depth; 0.118cm^3 volume. Character of Wound/Ulcer Post Debridement is stable. Post procedure Diagnosis Wound #2: Same as Pre-Procedure Plan Follow-up Appointments: Return Appointment in 1 week. Bathing/ Shower/ Hygiene: Wash wounds with antibacterial soap and water. Anesthetic (Use 'Patient Medications' Section for Anesthetic Order Entry): Lidocaine applied to wound bed Edema Control - Lymphedema / Segmental Compressive Device / Other: Elevate, Exercise Daily and Avoid Standing for Long Periods of Time. Elevate legs to the level of the heart and pump ankles as often as possible Elevate leg(s) parallel to the floor when sitting. DO YOUR BEST to sleep in the bed at night. DO NOT sleep in your recliner. Long hours of sitting in a recliner leads to swelling of the legs and/or potential wounds on your backside. WOUND #1: - T Great Wound Laterality: Right, Lateral oe Cleanser: Byram Ancillary Kit - 15 Day Supply (Generic) 3 x Per Week/30 Days Discharge Instructions: Use supplies as instructed; Kit contains: (15) Saline Bullets; (15) 3x3 Gauze; 15 pr Gloves Cleanser: Soap and Water 3 x Per Week/30 Days Discharge Instructions: Gently cleanse wound with antibacterial soap, rinse and pat dry prior to dressing wounds Prim Dressing: Hydrofera Blue Ready Transfer Foam, 2.5x2.5 (in/in) 3 x Per Week/30 Days ary Discharge Instructions: Apply Hydrofera Blue Ready to wound bed as directed Secondary Dressing: Conforming Guaze Roll-Small 3 x Per Week/30 Days Discharge Instructions: Apply Conforming Stretch Guaze Bandage as directed Secondary Dressing: Gauze 3 x Per Week/30 Days Discharge Instructions: As directed: dry, for padding Secured With: Medipore T - 43M Medipore H Soft Cloth Surgical  T ape ape, 2x2 (in/yd) (Generic) 3 x Per Week/30 Days WOUND #2: - Foot Wound Laterality: Right, Lateral Cleanser: Soap and Water 3 x Per Week/30 Days Discharge Instructions: Gently cleanse wound with antibacterial soap, rinse and pat dry prior to dressing wounds [name], [name] ([id number]) [id number] Page 7 of 7 Prim Dressing: Hydrofera Blue Ready Transfer Foam, 2.5x2.5 (in/in) 3 x Per Week/30 Days ary Discharge Instructions: Apply Hydrofera Blue Ready to wound bed as directed Secured With: Tegaderm Film 4x4 (in/in) 3 x Per  Week/30 Days Discharge Instructions: Apply to wound bed 1. I would suggest that we have [facility] which [facility] think is doing a really good job currently. 2. Also can recommend we continue with the T egaderm for the side of the foot which I think will hold this in place better. 3. I am also going to continue to with the gauze followed by the roll gauze to secure in place around the toe. We will see patient back for reevaluation in 1 week here in the clinic. If anything worsens or changes patient will contact our office for additional recommendations. Electronic Signature(s) Signed: 03/31/2023 2:29:53 PM By: Allen DerryStone, Hadley Soileau PA-C Entered By: Lenda KelpStone III, Pink Maye on 03/31/2023 14:29:53 -------------------------------------------------------------------------------- SuperBill Details Patient Name: Date of Service: Victoria SafeBO Rowland, [name] 03/31/2023 Medical Record Number: [id number] Patient Account Number: [id number] Date of Birth/Sex: Treating RN: 10/06/1951 (72 y.o. 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 Z79.01 Long term (current) use of  anticoagulants J44.9 Chronic obstructive pulmonary disease, unspecified I25.10 Atherosclerotic heart disease of native coronary artery without angina pectoris Facility Procedures : CPT4 Code: [id number] Description: 11042 - DEB SUBQ TISSUE 20 SQ CM/< ICD-10 Diagnosis Description L97.512 Non-pressure chronic ulcer of other part of right foot with fat layer exposed Modifier: Quantity: 1 Physician Procedures : CPT4 Code Description Modifier [id number] 11042 - WC PHYS SUBQ TISS 20 SQ CM ICD-10 Diagnosis Description L97.512 Non-pressure chronic ulcer of other part of right foot with fat layer exposed Quantity: 1 Electronic Signature(s) Signed: 03/31/2023 2:32:16 PM By: Allen DerryStone, Ashanta Amoroso PA-C Entered By: Lenda KelpStone III, Tyla Burgner on 03/31/2023 14:32:15

## 2023-03-31 NOTE — Progress Notes (Signed)
[name], [name] ([id number]) [id number] Victoria 1 of 8 Visit Report for 03/31/2023 Arrival Information Details Patient Name: Date of Service: [name], [name] 03/31/2023 1:00 PM Medical Record Number: [id number] Patient Account Number: [id number] Date of Birth/Sex: Treating RN: October 30, 1951 (72 y.o. [name] Primary Care [name]: PA [name], [place] Other Clinician: Referring [name]: Treating [name]/Extender: [name] in Treatment: 6 Visit Information History Since Last Visit Added or deleted any medications: No Patient Arrived: Wheel Chair Any new allergies or adverse reactions: No Arrival Time: 13:05 Had a fall or experienced change in No Accompanied By: daughter activities of daily living that may affect Transfer Assistance: EasyPivot Patient Lift risk of falls: Patient Identification Verified: Yes Hospitalized since last visit: No Secondary Verification Process Completed: Yes Has Dressing in Place as Prescribed: Yes Patient Requires Transmission-Based Precautions: No Has Footwear/Offloading in Place as Yes Patient Has Alerts: Yes Prescribed: Patient Alerts: Patient on Blood Thinner Right: Surgical Shoe with Pressure Relief Insole PLAVIX Pain Present Now: No ASA 81 mg R ABI 0.70 TBI 0.34 L ABI 0.76 TBI 0.00 Electronic Signature(s) Signed: 03/31/2023 2:10:40 PM By: [name] Entered By: [name] on 03/31/2023 14:10:40 -------------------------------------------------------------------------------- Clinic Level of Care Assessment Details Patient Name: Date of Service: [name], [name] 03/31/2023 1:00 PM Medical Record Number: [id number] Patient Account Number: [id number] Date of Birth/Sex: Treating RN: Nov 04, 1951 (72 y.o. [name] Primary Care Nijah Tejera: PA [name], [place] Other Clinician: Referring Mcdaniel Ohms: Treating Allycia Pitz/Extender: [name] in Treatment: 6 Clinic Level of Care  Assessment Items TOOL 1 Quantity Score []  - 0 Use when EandM and Procedure is performed on INITIAL visit ASSESSMENTS - Nursing Assessment / Reassessment []  - 0 General Physical Exam (combine w/ comprehensive assessment (listed just below) when performed on new pt. evals) []  - 0 Comprehensive Assessment (HX, ROS, Risk Assessments, Wounds Hx, etc.) ASSESSMENTS - Wound and Skin Assessment / Reassessment []  - 0 Dermatologic / Skin Assessment (not related to wound area) ASSESSMENTS - Ostomy and/or Continence Assessment and Care []  - 0 Incontinence Assessment and Management []  - 0 Ostomy Care Assessment and Management (repouching, etc.) PROCESS - Coordination of Care []  - 0 Simple Patient / Family Education for ongoing care []  - 0 Complex (extensive) Patient / Family Education for ongoing care []  - 0 Staff obtains Chiropractor, Records, T Results / Process Orders est []  - 0 Staff telephones HHA, Nursing Homes / Clarify orders / etc []  - 0 Routine Transfer to another Facility (non-emergent condition) []  - 0 Routine Hospital Admission (non-emergent condition) []  - 0 New Admissions / Insurance Authorizations / Ordering NPWT Apligraf, etc. , [name], [name] ([id number]) [id number] Victoria 2 of 8 []  - 0 Emergency Hospital Admission (emergent condition) PROCESS - Special Needs []  - 0 Pediatric / Minor Patient Management []  - 0 Isolation Patient Management []  - 0 Hearing / Language / Visual special needs []  - 0 Assessment of Community assistance (transportation, D/C planning, etc.) []  - 0 Additional assistance / Altered mentation []  - 0 Support Surface(s) Assessment (bed, cushion, seat, etc.) INTERVENTIONS - Miscellaneous []  - 0 External ear exam []  - 0 Patient Transfer (multiple staff / Nurse, adult / Similar devices) []  - 0 Simple Staple / Suture removal (25 or less) []  - 0 Complex Staple / Suture removal (26 or more) []  - 0 Hypo/Hyperglycemic  Management (do not check if billed separately) []  - 0 Ankle / Brachial Index (ABI) - do not check if billed separately Has the patient been seen at  the hospital within the last three years: Yes Total Score: 0 Level Of Care: ____ Electronic Signature(s) Signed: 03/31/2023 3:38:07 PM By: [name] Entered By: [name] on 03/31/2023 13:51:03 -------------------------------------------------------------------------------- Encounter Discharge Information Details Patient Name: Date of Service: [name], [name] 03/31/2023 1:00 PM Medical Record Number: [id number] Patient Account Number: [id number] Date of Birth/Sex: Treating RN: 12/27/1950 (72 y.o. [name] Primary Care Tyreek Clabo: PA [name], [place] Other Clinician: Referring Yuto Cajuste: Treating Lynsi Dooner/Extender: [name] in Treatment: 6 Encounter Discharge Information Items Post Procedure Vitals Discharge Condition: Stable Temperature (F): 98.2 Ambulatory Status: Wheelchair Pulse (bpm): 72 Discharge Destination: Home Respiratory Rate (breaths/min): 18 Transportation: Private Auto Blood Pressure (mmHg): 142/84 Accompanied By: family Schedule Follow-up Appointment: Yes Clinical Summary of Care: Electronic Signature(s) Signed: 03/31/2023 3:38:07 PM By: [name] Entered By: [name] on 03/31/2023 13:52:15 -------------------------------------------------------------------------------- Lower Extremity Assessment Details Patient Name: Date of Service: [name], [name] 03/31/2023 1:00 PM Medical Record Number: [id number] Patient Account Number: [id number] Date of Birth/Sex: Treating RN: August 07, 1951 (72 y.o. [name] Primary Care Monzerrat Wellen: PA [name], [place] Other Clinician: Referring Edmund Holcomb: Treating Zanayah Shadowens/Extender: [name] in Treatment: 6 Edema Assessment Assessed: [Left: No] [Right: No] [Left: Edema] [name], [name] Calf Left: Right: Point of Measurement: 31 cm From Medial Instep 35 cm Ankle Left: Right: Point of Measurement: 12 cm From Medial Instep 23.2 cm Vascular Assessment Pulses: Dorsalis Pedis Palpable: [Right:Yes] Posterior Tibial Palpable: [Right:Yes] Electronic Signature(s) Signed: 03/31/2023 3:38:07 PM By: [name] Entered By: [name] on 03/31/2023 13:16:52 -------------------------------------------------------------------------------- Multi Wound Chart Details Patient Name: Date of Service: [name], Oliana 03/31/2023 1:00 PM Medical Record Number: [id number] Patient Account Number: [id number] Date of Birth/Sex: Treating RN: 11/25/1951 (72 y.o. 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 N/A Primary Etiology: Asthma, Chronic Obstructive Asthma, Chronic Obstructive N/A Comorbid History: Pulmonary Disease (COPD), Coronary Pulmonary Disease (COPD), Coronary Artery Disease Artery Disease 12/29/2022 12/29/2022 N/A Date Acquired: 6 6 N/A Weeks of Treatment: Open Open N/A Wound Status: No No N/A Wound Recurrence: Yes Yes N/A Pending A mputation on Presentation: 3.5x1.6x0.1 1.5x1x0.1 N/A Measurements L x W x D (cm) 4.398 1.178 N/A A (cm) : rea 0.44 0.118 N/A Volume (cm) : 17.00% 50.00% N/A % Reduction in A rea: 58.50% 74.90% N/A % Reduction in Volume: Full Thickness Without Exposed Full Thickness Without  Exposed N/A Classification: Support Structures Support Structures Medium Medium N/A Exudate Amount: Serosanguineous Serosanguineous N/A Exudate Type: red, brown red, brown N/A Exudate Color: Medium (34-66%) Large (67-100%) N/A Granulation Amount: Red, Pink Red, Pink N/A Granulation QualityOpel Mansi, [name] ([id number]) [id number] Victoria 4 of 8 Medium (34-66%) Small (1-33%) N/A Necrotic Amount: Fat Layer (Subcutaneous Tissue): Yes Fat Layer (Subcutaneous Tissue): Yes N/A Exposed Structures: Fascia: No Fascia: No Tendon: No Tendon: No Muscle: No Muscle: No Joint: No Joint: No Bone: No Bone: No None None N/A Epithelialization: Treatment Notes Electronic Signature(s) Signed: 03/31/2023 3:38:07 PM By: [name] Entered By: [name] on 03/31/2023 13:44:14 -------------------------------------------------------------------------------- Multi-Disciplinary Care Plan Details Patient Name: Date of Service: [name], Elbert 03/31/2023 1:00 PM  Medical Record Number: [id number] Patient Account Number: [id number] Date of Birth/Sex: Treating RN: 06/15/1951 (72 y.o. Victoria LinksF) Gordon, Caitlin Primary Care Reylene Stauder: PA Victoria JordanIENT, [place] Other Clinician: Referring Liddie Chichester: Treating Trysten Bernard/Extender: Victoria DubinStone, Hoyt Wagoner, Matthew Weeks in Treatment: 6 Active Inactive Wound/Skin Impairment Nursing Diagnoses: Impaired tissue integrity Knowledge deficit related to ulceration/compromised skin integrity Goals: Patient will have a decrease in wound volume by X% from date: (specify in notes) Date Initiated: 02/16/2023 Target Resolution Date: 04/20/2023 Goal Status: Active Patient/caregiver will verbalize understanding of skin care regimen Date Initiated: 02/16/2023 Date Inactivated: 03/03/2023 Target Resolution Date: 03/17/2023 Goal Status: Met Ulcer/skin breakdown will have a volume reduction of 30% by week 4 Date Initiated: 02/16/2023 Date Inactivated: 03/24/2023 Target  Resolution Date: 03/17/2023 Goal Status: Met Ulcer/skin breakdown will have a volume reduction of 50% by week 8 Date Initiated: 02/16/2023 Target Resolution Date: 04/17/2023 Goal Status: Active Ulcer/skin breakdown will have a volume reduction of 80% by week 12 Date Initiated: 02/16/2023 Target Resolution Date: 05/17/2023 Goal Status: Active Interventions: Assess patient/caregiver ability to obtain necessary supplies Assess patient/caregiver ability to perform ulcer/skin care regimen upon admission and as needed Assess ulceration(s) every visit Provide education on ulcer and skin care Treatment Activities: Referred to DME [employer] for dressing supplies : 02/16/2023 Skin care regimen initiated : 02/16/2023 Notes: Electronic Signature(s) Signed: 03/31/2023 3:38:07 PM By: Victoria PihGordon, Caitlin Entered By: Victoria PihGordon, Caitlin on 03/31/2023 13:51:22 [name], Victoria HeraldINDA ([id number]) [id number] Victoria 5 of 8 -------------------------------------------------------------------------------- Pain Assessment Details Patient Name: Date of Service: [name], [name] 03/31/2023 1:00 PM Medical Record Number: [id number] Patient Account Number: [id number] Date of Birth/Sex: Treating RN: 05/29/1951 (72 y.o. Victoria LinksF) Gordon, Caitlin Primary Care Wyland Rastetter: PA Victoria JordanIENT, [place] Other Clinician: Referring Rashon Westrup: Treating Jevaughn Degollado/Extender: Victoria DubinStone, Hoyt Wagoner, Matthew Weeks in Treatment: 6 Active Problems Location of Pain Severity and Description of Pain Patient Has Paino No Site Locations Rate the pain. Current Pain Level: 0 Pain Management and Medication Current Pain Management: Electronic Signature(s) Signed: 03/31/2023 3:38:07 PM By: Victoria PihGordon, Caitlin Entered By: Victoria PihGordon, Caitlin on 03/31/2023 13:06:38 -------------------------------------------------------------------------------- Patient/Caregiver Education Details Patient Name: Date of Service: Victoria SafeBO Rowland, [name] 4/9/2024andnbsp1:00 PM Medical Record Number:  [id number] Patient Account Number: [id number] Date of Birth/Gender: Treating RN: 03/05/1951 (72 y.o. Victoria LinksF) Gordon, Caitlin Primary Care Physician: PA Victoria JordanIENT, [place] Other Clinician: Referring Physician: Treating Physician/Extender: Victoria DubinStone, Hoyt Wagoner, Matthew Weeks in Treatment: 6 Education Assessment Education Provided To: Patient and Caregiver Education Topics Provided Wound Debridement: Handouts: Wound Debridement Methods: Explain/Verbal Responses: State content correctly Wound/Skin Impairment: Handouts: Caring for Your Ulcer Methods: Explain/Verbal Responses: State content correctly Electronic Signature(s) Signed: 03/31/2023 3:38:07 PM By: Idamae SchullerGordon, Caitlin [name], [name] ([id number]) [id number] Victoria 6 of 8 Entered By: Victoria PihGordon, Caitlin on 03/31/2023 13:51:38 -------------------------------------------------------------------------------- Wound Assessment Details Patient Name: Date of Service: [name], [name] 03/31/2023 1:00 PM Medical Record Number: [id number] Patient Account Number: [id number] Date of Birth/Sex: Treating RN: 08/01/1951 (72 y.o. Victoria LinksF) Gordon, Caitlin Primary Care Michaila Kenney: PA TIENT, [place] Other Clinician: Referring Buffey Zabinski: Treating Edita Weyenberg/Extender: Victoria DubinStone, Hoyt Wagoner, Matthew Weeks in Treatment: 6 Wound Status Wound Number: 1 Primary Neuropathic Ulcer-Non Diabetic Etiology: Wound Location: Right, Lateral T Great oe Wound Open Wounding Event: Gradually Appeared Status: Date Acquired: 12/29/2022 Comorbid Asthma, Chronic Obstructive Pulmonary Disease (COPD), Weeks Of Treatment: 6 History: Coronary Artery Disease Clustered Wound: No Pending Amputation On Presentation Photos Wound Measurements Length: (cm) 3.5 Width: (cm) 1.6 Depth: (cm) 0.1 Area: (cm) 4.398 Volume: (cm) 0.44 % Reduction in Area: 17% % Reduction in Volume: 58.5% Epithelialization: None Tunneling: No Undermining: No Wound Description Classification: Full  Thickness  Without Exposed Suppor Exudate Amount: Medium Exudate Type: Serosanguineous Exudate Color: red, brown t Structures Foul Odor After Cleansing: No Slough/Fibrino Yes Wound Bed Granulation Amount: Medium (34-66%) Exposed Structure Granulation Quality: Red, Pink Fascia Exposed: No Necrotic Amount: Medium (34-66%) Fat Layer (Subcutaneous Tissue) Exposed: Yes Necrotic Quality: Adherent Slough Tendon Exposed: No Muscle Exposed: No Joint Exposed: No Bone Exposed: No Treatment Notes Wound #1 (Toe Great) Wound Laterality: Right, Lateral Cleanser Byram Ancillary Kit - 15 Day Supply Discharge Instruction: Use supplies as instructed; Kit contains: (15) Saline Bullets; (15) 3x3 Gauze; 15 pr Gloves Soap and Water Discharge Instruction: Gently cleanse wound with antibacterial soap, rinse and pat dry prior to dressing wounds Peri-Wound Care Topical Primary Dressing [place], [name] ([id number]) [id number] Victoria 7 of 8 Gauze Discharge Instruction: As directed: dry, over blue Hydrofera Blue Ready Transfer Foam, 2.5x2.5 (in/in) Discharge Instruction: Apply Hydrofera Blue Ready to wound bed as directed Secondary Dressing Conforming Guaze Roll-Small Discharge Instruction: Apply Conforming Stretch Guaze Bandage as directed Secured With Medipore T - 35M Medipore H Soft Cloth Surgical T ape ape, 2x2 (in/yd) Compression Wrap Compression Stockings Add-Ons Electronic Signature(s) Signed: 03/31/2023 3:38:07 PM By: [name] Entered By: [name] on 03/31/2023 13:15:16 -------------------------------------------------------------------------------- Wound Assessment Details Patient Name: Date of Service: [name], [name] 03/31/2023 1:00 PM Medical Record Number: [id number] Patient Account Number: [id number] Date of Birth/Sex: Treating RN: 01-10-1951 (72 y.o. [name] Primary Care Keiden Deskin: PA TIENT, [place] Other Clinician: Referring Ether Wolters: Treating  Arshia Rondon/Extender: [name] in Treatment: 6 Wound Status Wound Number: 2 Primary Neuropathic Ulcer-Non Diabetic Etiology: Wound Location: Right, Lateral Foot Wound Open Wounding Event: Pressure Injury Status: Date Acquired: 12/29/2022 Comorbid Asthma, Chronic Obstructive Pulmonary Disease (COPD), Weeks Of Treatment: 6 History: Coronary Artery Disease Clustered Wound: No Pending Amputation On Presentation Photos Wound Measurements Length: (cm) 1.5 Width: (cm) 1 Depth: (cm) 0.1 Area: (cm) 1.178 Volume: (cm) 0.118 % Reduction in Area: 50% % Reduction in Volume: 74.9% Epithelialization: None Tunneling: No Undermining: No Wound Description Classification: Full Thickness Without Exposed Suppor Exudate Amount: Medium Exudate Type: Serosanguineous Exudate Color: red, brown t Structures Foul Odor After Cleansing: No Slough/Fibrino Yes Wound Bed Granulation Amount: Large (67-100%) Exposed Structure Granulation Quality: Red, Pink Fascia Exposed: No Necrotic Amount: Small (1-33%) Fat Layer (Subcutaneous Tissue) Exposed: Yes Derryberry, Likisha ([id number]) [id number] Victoria 8 of 8 Necrotic Quality: Adherent Slough Tendon Exposed: No Muscle Exposed: No Joint Exposed: No Bone Exposed: No Treatment Notes Wound #2 (Foot) Wound Laterality: Right, Lateral Cleanser Soap and Water Discharge Instruction: Gently cleanse wound with antibacterial soap, rinse and pat dry prior to dressing wounds Peri-Wound Care Topical Primary Dressing Hydrofera Blue Ready Transfer Foam, 2.5x2.5 (in/in) Discharge Instruction: Apply Hydrofera Blue Ready to wound bed as directed Secondary Dressing Secured With Tegaderm Film 4x4 (in/in) Discharge Instruction: Apply to wound bed Compression Wrap Compression Stockings Add-Ons Electronic Signature(s) Signed: 03/31/2023 3:38:07 PM By: [name] Entered By: [name] on 03/31/2023  13:15:44 -------------------------------------------------------------------------------- Vitals Details Patient Name: Date of Service: [name], Aubrianne 03/31/2023 1:00 PM Medical Record Number: [id number] Patient Account Number: [id number] Date of Birth/Sex: Treating RN: 1951/04/26 (72 y.o. [name] Primary Care Eveline Sauve: PA TIENT, [place] Other Clinician: Referring Kristapher Dubuque: Treating Marriana Hibberd/Extender: [name] in Treatment: 6 Vital Signs Time Taken: 13:05 Temperature (F): 98.2 Height (in): 62 Pulse (bpm): 72 Weight (lbs): 162 Respiratory Rate (breaths/min): 18 Body Mass Index (BMI): 29.6 Blood Pressure (mmHg): 142/84 Reference Range: 80 - 120 mg / dl  Electronic Signature(s) Signed: 03/31/2023 3:38:07 PM By: [name] Entered By: [name] on 03/31/2023 13:06:16

## 2023-04-07 ENCOUNTER — Encounter: Payer: Medicare Other | Admitting: Physician Assistant

## 2023-04-07 DIAGNOSIS — G603 Idiopathic progressive neuropathy: Secondary | ICD-10-CM | POA: Diagnosis not present

## 2023-04-07 NOTE — Progress Notes (Signed)
TAKIRAH, BINFORD ([id number]) [id number] Page 1 of 8 Visit Report for 04/07/2023 Chief Complaint Document Details Patient Name: Date of Service: DORI, DEVINO 04/07/2023 2:00 PM Medical Record Number: [id number] Patient Account Number: [id number] Date of Birth/Sex: Treating RN: 14-Feb-1951 (72 y.o. [name] Primary Care Provider: PA Zenovia Jordan, [place] Other Clinician: Referring Provider: Treating Provider/Extender: Riccardo Dubin in Treatment: 7 Information Obtained from: Patient Chief Complaint Right foot ulcers Electronic Signature(s) Signed: 04/07/2023 2:20:05 PM By: Allen Derry PA-C Entered By: Allen Derry on 04/07/2023 14:20:04 -------------------------------------------------------------------------------- Debridement Details Patient Name: Date of Service: [name], Rileigh 04/07/2023 2:00 PM Medical Record Number: [id number] Patient Account Number: [id number] Date of Birth/Sex: Treating RN: Jan 14, 1951 (72 y.o. [name] Primary Care Provider: PA Zenovia Jordan, [place] Other Clinician: Referring Provider: Treating Provider/Extender: Riccardo Dubin in Treatment: 7 Debridement Performed for Assessment: Wound #2 Right,Lateral Foot Performed By: Physician Allen Derry, PA-C Debridement Type: Debridement Level of Consciousness (Pre-procedure): Awake and Alert Pre-procedure Verification/Time Out Yes - 14:23 Taken: Pain Control: Lidocaine 4% T opical Solution T Area Debrided (L x W): otal 1.5 (cm) x 1 (cm) = 1.5 (cm) Tissue and other material debrided: Viable, Non-Viable, Slough, Subcutaneous, Slough Level: Skin/Subcutaneous Tissue Debridement Description: Excisional Instrument: Curette Bleeding: Moderate Hemostasis Achieved: Pressure Response to Treatment: Procedure was tolerated well Level of Consciousness (Post- Awake and Alert procedure): Post Debridement Measurements of Total Wound Length: (cm) 1.5 Width: (cm)  1 Depth: (cm) 0.1 Volume: (cm) 0.118 Character of Wound/Ulcer Post Debridement: Stable Post Procedure Diagnosis Same as Pre-procedure Electronic Signature(s) Signed: 04/07/2023 3:41:48 PM By: Angelina Pih Signed: 04/07/2023 4:53:35 PM By: Allen Derry PA-C Entered By: Angelina Pih on 04/07/2023 14:26:39 Debridement Details -------------------------------------------------------------------------------- [name] ([id number]) [id number] Page 2 of 8 Patient Name: Date of Service: AZAIAH, MELLO 04/07/2023 2:00 PM Medical Record Number: [id number] Patient Account Number: [id number] Date of Birth/Sex: Treating RN: [birth year] (72 y.o. [name] Primary Care Provider: PA Zenovia Jordan, [place] Other Clinician: Referring Provider: Treating Provider/Extender: Riccardo Dubin in Treatment: 7 Debridement Performed for Assessment: Wound #1 Right,Lateral T Great oe Performed By: Physician Allen Derry, PA-C Debridement Type: Debridement Level of Consciousness (Pre-procedure): Awake and Alert Pre-procedure Verification/Time Out Yes - 14:25 Taken: Pain Control: Lidocaine 4% T opical Solution T Area Debrided (L x W): otal 3.5 (cm) x 1.4 (cm) = 4.9 (cm) Tissue and other material debrided: Viable, Non-Viable, Callus, Slough, Subcutaneous, Slough Level: Skin/Subcutaneous Tissue Debridement Description: Excisional Instrument: Curette Bleeding: Moderate Hemostasis Achieved: Pressure Response to Treatment: Procedure was tolerated well Level of Consciousness (Post- Awake and Alert procedure): Post Debridement Measurements of Total Wound Length: (cm) 3.5 Width: (cm) 1.4 Depth: (cm) 0.1 Volume: (cm) 0.385 Character of Wound/Ulcer Post Debridement: Stable Post Procedure Diagnosis Same as Pre-procedure Electronic Signature(s) Signed: 04/07/2023 3:41:48 PM By: Angelina Pih Signed: 04/07/2023 4:53:35 PM By: Allen Derry PA-C Entered By: Angelina Pih on 04/07/2023 14:29:36 -------------------------------------------------------------------------------- HPI Details Patient Name: Date of Service: [name], Maryna 04/07/2023 2:00 PM Medical Record Number: [id number] Patient Account Number: [id number] Date of Birth/Sex: Treating RN: [birth year] (72 y.o. [name] Primary Care Provider: PA TIENT, [place] Other Clinician: Referring Provider: Treating Provider/Extender: Riccardo Dubin in Treatment: 7 History of Present Illness HPI Description: 02-16-2023 upon evaluation today patient presents for initial inspection here in our clinic concerning issues that she has been having with wounds over the right foot in the realm of several toes as well as the lateral portion of her  foot fifth metatarsal location. Subsequently she unfortunately has somewhat poor arterial flow. I do have notes that I reviewed in epic and subsequently the patient did have an angiogram as well back in January. The testing following the angiogram revealed after stent placement on 01-09-2023 that she subsequently still had somewhat compromised arterial flow following with a right ABI of 0.70 with a TBI of 0.34 and on the left an ABI of 0.76 and the TBI could not be measured. 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 With that being said the follow-up as far as her arterial testing they did not do ABIs and TBI's but it showed that she was monophasic pretty much throughout and in general I think this still shows that there is a significant issue here and the vascular doctors seem to be in agreement as far as this is concerned they have recommended that she really needs to have repeat arterial angiogram with likely intervention to try to improve that monophasic flow. With that being said the patient is somewhat reluctant to do this though I think that she is leaning towards it even though she had a lot of bleeding issues in the groin area following the last time  she had this done. In regard to her wound she is actually having quite a bit of an issue here with these wounds specifically in the realm of getting them to turn around and heal appropriately. She tells me that the areas are looking much better than they were but nonetheless she still is noticing that the Betadine seems to be keeping things a little bit dry but some areas are starting to drain a lot more unfortunately. She is on aspirin and Plavix following the stent placement. This is probably part of the bleeding issues that she had previously in the groin to be peripherally honest. She does have a history of COPD and coronary artery disease as well. 3/5; patient is in for her second clinic visit. She has significant PAD status post revascularization a month ago. According to the patient she is being scheduled for another 1 in a month's time. She has 4 wounds on the right foot lateral foot proximal foot fourth toe and the medial part of the right first toe 3/12; patient presents for follow-up. She has been using Xeroform to the wound beds. She has no issues or complaints today. 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 03-10-2023 upon evaluation today patient presents for follow-up concerning her ongoing issues with her right lower extremity ulcers on foot. She has not seen [place], [place] ([id number]) [id number] Page 3 of 8 vascular yet they called them but they have not actually gotten her scheduled. They state they are to call back and see if they can get things moving along. With that being said she does seem to be making some improvements with regard to her toes and I am very pleased in that regard I feel like the wounds are looking better today. 3B-26-24 upon evaluation today patient appears to be doing well currently in regard to her wounds all things considered I definitely see signs of improvement which is great news.  Fortunately there does not appear to be any evidence of active infection locally nor systemically which is great news. No fevers, chills, nausea, vomiting, or diarrhea. 03-24-2023 upon evaluation today patient appears to be doing decently well in regard to her ulcers on her foot. Were down to just 2 and both are showing signs of improvement. Fortunately  I do not see any evidence of active infection locally nor systemically which is great news. 03-31-2023 upon evaluation today patient's wounds actually are showing signs of improvement. I think both are doing well with the [facility] and I am very pleased in that regard. I do not see any signs of infection locally nor systemically which is great news. 04-07-2023 upon evaluation today patient actually seems to be making good progress here with regard to her wounds. She has been tolerating the dressing changes without complication. Fortunately there does not appear to be any signs of active infection locally nor systemically at this time which is great news. No fevers, chills, nausea, vomiting, or diarrhea. Electronic Signature(s) Signed: 04/07/2023 2:35:27 PM By: Allen Derry PA-C Entered By: Allen Derry on 04/07/2023 14:35:26 -------------------------------------------------------------------------------- Physical Exam Details Patient Name: Date of Service: KIMBERLYE, DILGER 04/07/2023 2:00 PM Medical Record Number: [id number] Patient Account Number: [id number] Date of Birth/Sex: Treating RN: [birth year] (72 y.o. [name] Primary Care Provider: PA Zenovia Jordan, [place] Other Clinician: Referring Provider: Treating Provider/Extender: Riccardo Dubin in Treatment: 7 Constitutional Obese and well-hydrated in no acute distress. Respiratory normal breathing without difficulty. Psychiatric this patient is able to make decisions and demonstrates good insight into disease process. Alert and Oriented x 3. pleasant and  cooperative. Notes Upon inspection patient's wounds did require sharp debridement clearway necrotic debris patient tolerated this today without complication postdebridement wound bed appears to be doing much better which is great news. No fevers, chills, nausea, vomiting, or diarrhea. Electronic Signature(s) Signed: 04/07/2023 2:35:49 PM By: Allen Derry PA-C Entered By: Allen Derry on 04/07/2023 14:35:49 -------------------------------------------------------------------------------- Physician Orders Details Patient Name: Date of Service: [name], Daphne 04/07/2023 2:00 PM Medical Record Number: [id number] Patient Account Number: [id number] Date of Birth/Sex: Treating RN: 20-Apr-1951 (72 y.o. 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 Bathing/ [employer] wounds with antibacterial soap and water. Anesthetic (Use 'Patient Medications' Section for Anesthetic Order Entry) Lidocaine applied to wound bed Edema Control - Lymphedema / Segmental Compressive Device / Other Elevate, Exercise Daily and A void Standing for Long Periods of Time. Elevate legs to the level of the heart and pump ankles as often as possible Elevate leg(s) parallel to the floor when sitting. DO YOUR BEST to sleep in the bed at night. DO NOT sleep in your recliner. Long  hours of sitting in a recliner leads to swelling of the legs and/or potential wounds on your backside. Wound Treatment Wound #1 - T Great oe Wound Laterality: Right, Lateral Cleanser: [employer]ram Ancillary Kit - 15 Day Supply (Generic) 3 x Per Week/30 Days Discharge Instructions: Use supplies as instructed; Kit contains: (15) Saline Bullets; (15) 3x3 Gauze; 15 pr Gloves Cleanser: Soap and Water 3 x Per Week/30 Days Discharge Instructions: Gently cleanse wound with antibacterial soap, rinse and pat dry prior to dressing wounds Prim Dressing: Hydrofera Blue Ready Transfer Foam, 2.5x2.5 (in/in) 3 x Per Week/30 Days ary Discharge Instructions: Apply Hydrofera Blue Ready to wound bed as  directed Secondary Dressing: Gauze 3 x Per Week/30 Days Discharge Instructions: As directed: dry, for padding Secondary Dressing: Kerlix 4.5 x 4.1 (in/yd) 3 x Per Week/30 Days Discharge Instructions: Apply Kerlix 4.5 x 4.1 (in/yd) as instructed Secured With: Medipore T - 32M Medipore H Soft Cloth Surgical T ape ape, 2x2 (in/yd) (Generic) 3 x Per Week/30 Days Wound #2 - Foot Wound Laterality: Right, Lateral Cleanser: Soap and Water 3 x Per Week/30 Days Discharge Instructions: Gently cleanse wound with antibacterial soap, rinse and pat dry prior to dressing wounds Prim Dressing: Hydrofera Blue Ready Transfer Foam, 2.5x2.5 (in/in) 3 x Per Week/30 Days ary Discharge Instructions: Apply Hydrofera Blue Ready to wound bed as directed Secondary Dressing: Gauze 3 x Per Week/30 Days Discharge Instructions: As directed: dry Secured With: Medipore T - 32M Medipore H Soft Cloth Surgical T ape ape, 2x2 (in/yd) 3 x Per Week/30 Days Secured With: [employer] or Non-Sterile 6-ply 4.5x4 (yd/yd) 3 x Per Week/30 Days Discharge Instructions: Apply Kerlix as directed Electronic Signature(s) Signed: 04/07/2023 3:41:48 PM By: Angelina Pih Signed: 04/07/2023 4:53:35 PM By: Allen Derry PA-C Entered By: Angelina Pih on  04/07/2023 14:26:35 -------------------------------------------------------------------------------- Problem List Details Patient Name: Date of Service: [name], Keeana 04/07/2023 2:00 PM Medical Record Number: [id number] Patient Account Number: [id number] Date of Birth/Sex: Treating RN: 1951/04/29 (72 y.o. 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 02/16/2023 No Yes SHAVON, ASHMORE ([id number]) [id number] Page 5 of 8 [id number] Non-pressure chronic ulcer of other part of right foot with fat layer exposed 02/16/2023 No Yes Z79.01 Long term (current) use of anticoagulants 02/16/2023 No Yes J44.9 Chronic obstructive pulmonary disease, unspecified 02/16/2023 No Yes I25.10 Atherosclerotic heart disease of native coronary artery without angina pectoris 02/16/2023 No Yes Inactive Problems Resolved Problems Electronic Signature(s) Signed: 04/07/2023 2:19:28 PM By: Allen Derry PA-C Entered By: Allen Derry on 04/07/2023 14:19:28 -------------------------------------------------------------------------------- Progress Note Details Patient Name: Date of Service: [name], Saydee 04/07/2023 2:00 PM Medical Record Number: [id number] Patient Account Number: [id number] Date of Birth/Sex: Treating RN: [birth year] (72 y.o. [name] Primary Care Provider: PA TIENT, [place] Other Clinician: Referring Provider: Treating Provider/Extender: Riccardo Dubin in Treatment: 7 Subjective Chief Complaint Information obtained from Patient Right foot ulcers History of Present Illness (HPI) 02-16-2023 upon evaluation today patient presents for initial inspection here in our clinic concerning issues that she has been having with wounds  over the right foot in the realm of several toes as well as the lateral portion of her foot fifth metatarsal location. Subsequently she unfortunately has somewhat poor arterial flow. I do have notes that I reviewed in epic and subsequently the patient did have an angiogram as well back in January. The testing following the angiogram revealed after stent placement on 01-09-2023 that she subsequently still had somewhat compromised arterial flow following with a right ABI of 0.70 with a TBI of 0.34 and on the left an ABI of 0.76 and the TBI could not be measured. 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 With that being said the follow-up as far as her arterial testing they did not do ABIs and TBI's but it showed that she was monophasic pretty much throughout and in general I think this still shows that there is a significant issue here and the vascular doctors seem to be in  agreement as far as this is concerned they have recommended that she really needs to have repeat arterial angiogram with likely intervention to try to improve that monophasic flow. With that being said the patient is somewhat reluctant to do this though I think that she is leaning towards it even though she had a lot of bleeding issues in the groin area following the last time she had this done. In regard to her wound she is actually having quite a bit of an issue here with these wounds specifically in the realm of getting them to turn around and heal appropriately. She tells me that the areas are looking much better than they were but nonetheless she still is noticing that the Betadine seems to be keeping things a little bit dry but some areas are starting to drain a lot more unfortunately. She is on aspirin and Plavix following the stent placement. This is probably part of the bleeding issues that she had previously in the groin to be peripherally honest. She does have a history of COPD and coronary artery disease as well. 3/5; patient  is in for her second clinic visit. She has significant PAD status post revascularization a month ago. According to the patient she is being scheduled for another 1 in a month's time. She has 4 wounds on the right foot lateral foot proximal foot fourth toe and the medial part of the right first toe 3/12; patient presents for follow-up. She has been using Xeroform to the wound beds. She has no issues or complaints today. 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 03-10-2023 upon evaluation today patient presents for follow-up concerning her ongoing issues with her right lower extremity ulcers on foot. She has not seen vascular yet they called them but they have not actually gotten her scheduled. They state they are to call back and see if they can get things moving along. With that being said she does seem to be making some improvements with regard to her toes and I am very pleased in that regard I feel like the wounds are looking better today. 3B-26-24 upon evaluation today patient appears to be doing well currently in regard to her wounds all things considered I definitely see signs of improvement which is great news. Fortunately there does not appear to be any evidence of active infection locally nor systemically which is great news. No fevers, chills, nausea, vomiting, or diarrhea. 03-24-2023 upon evaluation today patient appears to be doing decently well in regard to her ulcers on her foot. Were down to just 2 and both are showing signs of improvement. Fortunately I do not see any evidence of active infection locally nor systemically which is great news. ROSLIND, MICHAUX ([id number]) [id number] Page 6 of 8 03-31-2023 upon evaluation today patient's wounds actually are showing signs of improvement. I think both are doing well with the [facility] and I am very pleased in that regard. I do not see any signs of infection locally nor  systemically which is great news. 04-07-2023 upon evaluation today patient actually seems to be making good progress here with regard to her wounds. She has been tolerating the dressing changes without complication. Fortunately there does not appear to be any signs of active infection locally nor systemically at this time which is great news. No fevers, chills, nausea, vomiting, or diarrhea. Objective Constitutional Obese and well-hydrated in no acute distress. Vitals Time Taken: 2:06 PM, Height: 62 in, Weight: 162 lbs, BMI: 29.6, Temperature: 98.2  F, Pulse: 76 bpm, Respiratory Rate: 18 breaths/min, Blood Pressure: 121/73 mmHg. Respiratory normal breathing without difficulty. Psychiatric this patient is able to make decisions and demonstrates good insight into disease process. Alert and Oriented x 3. pleasant and cooperative. General Notes: Upon inspection patient's wounds did require sharp debridement clearway necrotic debris patient tolerated this today without complication postdebridement wound bed appears to be doing much better which is great news. No fevers, chills, nausea, vomiting, or diarrhea. Integumentary (Hair, Skin) Wound #1 status is Open. Original cause of wound was Gradually Appeared. The date acquired was: 12/29/2022. The wound has been in treatment 7 weeks. The wound is located on the Right,Lateral T Great. The wound measures 3.5cm length x 1.4cm width x 0.1cm depth; 3.848cm^2 area and 0.385cm^3 volume. oe There is Fat Layer (Subcutaneous Tissue) exposed. There is no tunneling or undermining noted. There is a medium amount of serosanguineous drainage noted. There is medium (34-66%) red, pink granulation within the wound bed. There is a medium (34-66%) amount of necrotic tissue within the wound bed including Adherent Slough. Wound #2 status is Open. Original cause of wound was Pressure Injury. The date acquired was: 12/29/2022. The wound has been in treatment 7 weeks. The wound  is located on the Right,Lateral Foot. The wound measures 1.5cm length x 1cm width x 0.1cm depth; 1.178cm^2 area and 0.118cm^3 volume. There is Fat Layer (Subcutaneous Tissue) exposed. There is no tunneling noted. There is a medium amount of serosanguineous drainage noted. There is large (67-100%) red, pink granulation within the wound bed. There is a small (1-33%) amount of necrotic tissue within the wound bed including Adherent Slough. General Notes: surrounding skin appears macerated, stayed to moist. 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 There was a Excisional Skin/Subcutaneous oe Tissue Debridement with a total area of 4.9 sq cm performed by Allen Derry, PA-C. With the following instrument(s): Curette to remove Viable and Non-Viable tissue/material. Material removed includes Callus, Subcutaneous Tissue, and Slough after achieving pain control using Lidocaine 4% Topical Solution. No specimens were taken. A time out was conducted at 14:25, prior to the start of the procedure. A Moderate amount of bleeding was controlled with Pressure. The procedure was tolerated well. Post Debridement Measurements: 3.5cm length x 1.4cm width x 0.1cm depth; 0.385cm^3 volume. Character of Wound/Ulcer Post Debridement is stable. Post procedure Diagnosis Wound #1: Same as Pre-Procedure Wound #2 Pre-procedure diagnosis of Wound #2 is a Neuropathic Ulcer-Non Diabetic located on the Right,Lateral Foot . There was a Excisional Skin/Subcutaneous Tissue Debridement with a total area of 1.5 sq cm performed by  Allen Derry, PA-C. With the following instrument(s): Curette to remove Viable and Non-Viable tissue/material. Material removed includes Subcutaneous Tissue and Slough and after achieving pain control using Lidocaine 4% T opical Solution. No specimens were taken. A time out was conducted at 14:23, prior to the start of the procedure. A Moderate amount of bleeding was controlled with Pressure. The procedure was tolerated well. Post Debridement Measurements: 1.5cm length x 1cm width x 0.1cm depth; 0.118cm^3 volume. Character of Wound/Ulcer Post Debridement is stable. Post procedure Diagnosis Wound #2: Same as Pre-Procedure INOUE, Sierra ([id number]) [id number] Page 7 of 8 Plan Follow-up Appointments: Return  Appointment in 1 week. Bathing/ Shower/ Hygiene: Wash wounds with antibacterial soap and water. Anesthetic (Use 'Patient Medications' Section for Anesthetic Order Entry): Lidocaine applied to wound bed Edema Control - Lymphedema / Segmental Compressive Device / Other: Elevate, Exercise Daily and Avoid Standing for Long Periods of Time. Elevate legs to the level of the heart and pump ankles as often as possible Elevate leg(s) parallel to the floor when sitting. DO YOUR BEST to sleep in the bed at night. DO NOT sleep in your recliner. Long hours of sitting in a recliner leads to swelling of the legs and/or potential wounds on your backside. WOUND #1: - T Great Wound Laterality: Right, Lateral oe Cleanser: Byram Ancillary Kit - 15 Day Supply (Generic) 3 x Per Week/30 Days Discharge Instructions: Use supplies as instructed; Kit contains: (15) Saline Bullets; (15) 3x3 Gauze; 15 pr Gloves Cleanser: Soap and Water 3 x Per Week/30 Days Discharge Instructions: Gently cleanse wound with antibacterial soap, rinse and pat dry prior to dressing wounds Prim Dressing: Hydrofera Blue Ready Transfer Foam, 2.5x2.5 (in/in) 3 x Per Week/30 Days ary Discharge Instructions: Apply  Hydrofera Blue Ready to wound bed as directed Secondary Dressing: Gauze 3 x Per Week/30 Days Discharge Instructions: As directed: dry, for padding Secondary Dressing: Kerlix 4.5 x 4.1 (in/yd) 3 x Per Week/30 Days Discharge Instructions: Apply Kerlix 4.5 x 4.1 (in/yd) as instructed Secured With: Medipore T - 20M Medipore H Soft Cloth Surgical T ape ape, 2x2 (in/yd) (Generic) 3 x Per Week/30 Days WOUND #2: - Foot Wound Laterality: Right, Lateral Cleanser: Soap and Water 3 x Per Week/30 Days Discharge Instructions: Gently cleanse wound with antibacterial soap, rinse and pat dry prior to dressing wounds Prim Dressing: Hydrofera Blue Ready Transfer Foam, 2.5x2.5 (in/in) 3 x Per Week/30 Days ary Discharge Instructions: Apply Hydrofera Blue Ready to wound bed as directed Secondary Dressing: Gauze 3 x Per Week/30 Days Discharge Instructions: As directed: dry Secured With: Medipore T - 20M Medipore H Soft Cloth Surgical T ape ape, 2x2 (in/yd) 3 x Per Week/30 Days Secured With: [employer] Sterile or Non-Sterile 6-ply 4.5x4 (yd/yd) 3 x Per Week/30 Days Discharge Instructions: Apply Kerlix as directed 1. I am going to recommend based on what we are seeing currently that we have her continue with the wound care measures as before and she is in agreement with the plan. With that being said I know we have made a recommendation for vascular surgery. With that being said she has been very reluctant to go down that road to treat this right now I really feel like she is making good progress here with regard to the [facility] and the current wound care measures I do not think that she is necessarily going to have to have vascular intervention to heal it may help it speed up but at the same time I am actually seeing some pretty good progress here. 2. Would not continue with the [facility] Blue followed by the gauze to cover and then roll gauze to secure in place. We will see patient back for reevaluation in 1  week here in the clinic. If anything worsens or changes patient will contact our office for additional recommendations. Electronic Signature(s) Signed: 04/07/2023 2:36:29 PM By: Allen Derry PA-C Entered By: Allen Derry on 04/07/2023 14:36:29 -------------------------------------------------------------------------------- SuperBill Details Patient Name: Date of Service: [name], Arilynn 04/07/2023 Medical Record Number: [id number] Patient Account Number: [id number] Date of Birth/Sex: Treating RN: 27-Oct-1951 (72 y.o. 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 Z79.01 Long term (current) use of anticoagulants J44.9 Chronic obstructive pulmonary disease, unspecified I25.10 Atherosclerotic heart disease of native coronary artery without angina pectoris Facility Procedures [place], [place] ([id number]): CPT4 Code Description [id number] 11042 - DEB SUBQ TISSUE 20 SQ CM/< ICD-10 Diagnosis Description 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

## 2023-04-07 NOTE — Progress Notes (Signed)
Beaver, Lenise Herald ([id number]) [id number] Page 1 of 8 Visit Report for 04/07/2023 Arrival Information Details Patient Name: Date of Service: [name], [name] 04/07/2023 2:00 PM Medical Record Number: [id number] Patient Account Number: [id number] Date of Birth/Sex: Treating RN: 1951-01-28 (72 y.o. [name] Primary Care Maela Takeda: PA Zenovia Jordan, [place] Other Clinician: Referring Anaisha Mago: Treating Fausto Sampedro/Extender: Riccardo Dubin in Treatment: 7 Visit Information History Since Last Visit Added or deleted any medications: No Patient Arrived: Wheel Chair Any new allergies or adverse reactions: No Arrival Time: 14:03 Had a fall or experienced change in No Accompanied By: daughter activities of daily living that may affect Transfer Assistance: EasyPivot Patient Lift risk of falls: Patient Identification Verified: Yes Hospitalized since last visit: No Secondary Verification Process Completed: Yes Has Dressing in Place as Prescribed: Yes Patient Requires Transmission-Based Precautions: No Has Footwear/Offloading in Place as Yes Patient Has Alerts: Yes Prescribed: Patient Alerts: Patient on Blood Thinner Right: Surgical Shoe with Pressure Relief Insole PLAVIX Pain Present Now: No ASA 81 mg R ABI 0.70 TBI 0.34 L ABI 0.76 TBI 0.00 Electronic Signature(s) Signed: 04/07/2023 3:41:48 PM By: Angelina Pih Entered By: Angelina Pih on 04/07/2023 14:06:34 -------------------------------------------------------------------------------- Clinic Level of Care Assessment Details Patient Name: Date of Service: [name] 04/07/2023 2:00 PM Medical Record Number: [id number] Patient Account Number: [id number] Date of Birth/Sex: Treating RN: Jul 10, 1951 (72 y.o. [name] Primary Care Alesandro Stueve: PA Zenovia Jordan, [place] Other Clinician: Referring Donne Baley: Treating Jaquae Rieves/Extender: Riccardo Dubin in Treatment: 7 Clinic Level of Care  Assessment Items TOOL 1 Quantity Score  - 0 Use when EandM and Procedure is performed on INITIAL visit ASSESSMENTS - Nursing Assessment / Reassessment  - 0 General Physical Exam (combine w/ comprehensive assessment (listed just below) when performed on new pt. evals)  - 0 Comprehensive Assessment (HX, ROS, Risk Assessments, Wounds Hx, etc.) ASSESSMENTS - Wound and Skin Assessment / Reassessment  - 0 Dermatologic / Skin Assessment (not related to wound area) ASSESSMENTS - Ostomy and/or Continence Assessment and Care  - 0 Incontinence Assessment and Management  - 0 Ostomy Care Assessment and Management (repouching, etc.) PROCESS - Coordination of Care  - 0 Simple Patient / Family Education for ongoing care  - 0 Complex (extensive) Patient / Family Education for ongoing care  - 0 Staff obtains Chiropractor, Records, T Results / Process Orders est  - 0 Staff telephones HHA, Nursing Homes / Clarify orders / etc  - 0 Routine Transfer to another Facility (non-emergent condition)  - 0 Routine Hospital Admission (non-emergent condition)  - 0 New Admissions / Insurance Authorizations / Ordering NPWT Apligraf, etcJaelah, Hauth, Rowland ([id number]) [id number] Page 2 of 8  - 0 Emergency Hospital Admission (emergent condition) PROCESS - Special Needs  - 0 Pediatric / Minor Patient Management  - 0 Isolation Patient Management  - 0 Hearing / Language / Visual special needs  - 0 Assessment of Community assistance (transportation, D/C planning, etc.)  - 0 Additional assistance / Altered mentation  - 0 Support Surface(s) Assessment (bed, cushion, seat, etc.) INTERVENTIONS - Miscellaneous  - 0 External ear exam  - 0 Patient Transfer (multiple staff / Nurse, adult / Similar devices)  - 0 Simple Staple / Suture removal (25 or less)  - 0 Complex Staple / Suture removal (26 or more)  - 0 Hypo/Hyperglycemic  Management (do not check if billed separately)  - 0 Ankle / Brachial Index (ABI) - do not check if billed separately Has the patient been seen at  the hospital within the last three years: Yes Total Score: 0 Level Of Care: ____ Electronic Signature(s) Signed: 04/07/2023 3:41:48 PM By: Angelina Pih Entered By: Angelina Pih on 04/07/2023 14:55:01 -------------------------------------------------------------------------------- Encounter Discharge Information Details Patient Name: Date of Service: [name], [name] 04/07/2023 2:00 PM Medical Record Number: [id number] Patient Account Number: [id number] Date of Birth/Sex: Treating RN: [birth year] (72 y.o. [name] Primary Care Marquiz Sotelo: PA Zenovia Jordan, [place] Other Clinician: Referring Sascha Palma: Treating Zohar Laing/Extender: Riccardo Dubin in Treatment: 7 Encounter Discharge Information Items Post Procedure Vitals Discharge Condition: Stable Temperature (F): 98.2 Ambulatory Status: Wheelchair Pulse (bpm): 76 Discharge Destination: Home Respiratory Rate (breaths/min): 18 Transportation: Private Auto Blood Pressure (mmHg): 121/73 Accompanied By: daughter Schedule Follow-up Appointment: Yes Clinical Summary of Care: Electronic Signature(s) Signed: 04/07/2023 2:58:13 PM By: Angelina Pih Entered By: Angelina Pih on 04/07/2023 14:58:13 -------------------------------------------------------------------------------- Lower Extremity Assessment Details Patient Name: Date of Service: [name], [name] 04/07/2023 2:00 PM Medical Record Number: [id number] Patient Account Number: [id number] Date of Birth/Sex: Treating RN: [birth year] (72 y.o. [name] Primary Care Krithik Mapel: PA Zenovia Jordan, [place] Other Clinician: Referring Beckem Tomberlin: Treating Laurence Crofford/Extender: Riccardo Dubin in Treatment: 7 Edema Assessment Assessed: [Left: No] [Right: No] Edema: [Left: Ye] [Right: s] B[LeftLyla Son, Lenise Herald ([id number])]  [Right: [id number] Page 3 of 8] Calf Left: Right: Point of Measurement: 31 cm From Medial Instep 35 cm Ankle Left: Right: Point of Measurement: 12 cm From Medial Instep 22.3 cm Vascular Assessment Pulses: Dorsalis Pedis Palpable: [Right:Yes] Posterior Tibial Palpable: [Right:Yes] Electronic Signature(s) Signed: 04/07/2023 3:41:48 PM By: Angelina Pih Entered By: Angelina Pih on 04/07/2023 14:17:13 -------------------------------------------------------------------------------- Multi-Disciplinary Care Plan Details Patient Name: Date of Service: [name], [name] 04/07/2023 2:00 PM Medical Record Number: [id number] Patient Account Number: [id number] Date of Birth/Sex: Treating RN: 02/02/1951 (72 y.o. [name] Primary Care Lennon Boutwell: PA Zenovia Jordan, [place] Other Clinician: Referring Hope Holst: Treating Shaquia Berkley/Extender: Riccardo Dubin in Treatment: 7 Active Inactive Wound/Skin Impairment Nursing Diagnoses: Impaired tissue integrity Knowledge deficit related to ulceration/compromised skin integrity Goals: Patient will have a decrease in wound volume by X% from date: (specify in notes) Date Initiated: 02/16/2023 Target Resolution Date: 04/20/2023 Goal Status: Active Patient/caregiver will verbalize understanding of skin care regimen Date Initiated: 02/16/2023 Date Inactivated: 03/03/2023 Target Resolution Date: 03/17/2023 Goal Status: Met Ulcer/skin breakdown will have a volume reduction of 30% by week 4 Date Initiated: 02/16/2023 Date Inactivated: 03/24/2023 Target Resolution Date: 03/17/2023 Goal Status: Met Ulcer/skin breakdown will have a volume reduction of 50% by week 8 Date Initiated: 02/16/2023 Target Resolution Date: 04/17/2023 Goal Status: Active Ulcer/skin breakdown will have a volume reduction of 80% by week 12 Date Initiated: 02/16/2023 Target Resolution Date: 05/17/2023 Goal Status: Active Interventions: Assess  patient/caregiver ability to obtain necessary supplies Assess patient/caregiver ability to perform ulcer/skin care regimen upon admission and as needed Assess ulceration(s) every visit Provide education on ulcer and skin care Treatment Activities: Referred to DME Adaleena Mooers for dressing supplies : 02/16/2023 Skin care regimen initiated : 02/16/2023 Notes: Electronic Signature(s) Wilberforce, Lenise Herald ([id number]) [id number] Page 4 of 8 Signed: 04/07/2023 2:56:15 PM By: Angelina Pih Entered By: Angelina Pih on 04/07/2023 14:56:15 -------------------------------------------------------------------------------- Pain Assessment Details Patient Name: Date of Service: AARYANNA, HYDEN 04/07/2023 2:00 PM Medical Record Number: [id number] Patient Account Number: [id number] Date of Birth/Sex: Treating RN: [birth year] (72 y.o. [name] Primary Care Marciana Uplinger: PA Zenovia Jordan, [place] Other Clinician: Referring Ayari Liwanag: Treating Ilyana Manuele/Extender: Riccardo Dubin in Treatment: 7 Active Problems Location of Pain Severity and  Description of Pain Patient Has Paino No Site Locations Rate the pain. Current Pain Level: 0 Pain Management and Medication Current Pain Management: Electronic Signature(s) Signed: 04/07/2023 3:41:48 PM By: Angelina Pih Entered By: Angelina Pih on 04/07/2023 14:07:17 -------------------------------------------------------------------------------- Patient/Caregiver Education Details Patient Name: Date of Service: [name], [name] 4/16/2024andnbsp2:00 PM Medical Record Number: [id number] Patient Account Number: [id number] Date of Birth/Gender: Treating RN: 06/02/1951 (72 y.o. [name] Primary Care Physician: PA Zenovia Jordan, [place] Other Clinician: Referring Physician: Treating Physician/Extender: Riccardo Dubin in Treatment: 7 Education Assessment Education Provided To: Patient Education Topics Provided Tissue  Oxygenation: Handouts: Other: education on blood flow and wound healing Methods: Explain/Verbal Responses: State content correctly Wound Debridement: Handouts: Wound Debridement Methods: Explain/Verbal Responses: State content correctly [place], Lenise Herald ([id number]) [id number] Page 5 of 8 Wound/Skin Impairment: Handouts: Caring for Your Ulcer Methods: Explain/Verbal Responses: State content correctly Electronic Signature(s) Signed: 04/07/2023 3:41:48 PM By: Angelina Pih Entered By: Angelina Pih on 04/07/2023 14:57:26 -------------------------------------------------------------------------------- Wound Assessment Details Patient Name: Date of Service: [name], [name] 04/07/2023 2:00 PM Medical Record Number: [id number] Patient Account Number: [id number] Date of Birth/Sex: Treating RN: 1951/11/29 (72 y.o. [name] Primary Care Wessley Emert: PA TIENT, [place] Other Clinician: Referring Persia Lintner: Treating Sharnika Binney/Extender: Riccardo Dubin in Treatment: 7 Wound Status Wound Number: 1 Primary Neuropathic Ulcer-Non Diabetic Etiology: Wound Location: Right, Lateral T Great oe Wound Open Wounding Event: Gradually Appeared Status: Date Acquired: 12/29/2022 Comorbid Asthma, Chronic Obstructive Pulmonary Disease (COPD), Weeks Of Treatment: 7 History: Coronary Artery Disease Clustered Wound: No Pending Amputation On Presentation Photos Wound Measurements Length: (cm) 3.5 Width: (cm) 1.4 Depth: (cm) 0.1 Area: (cm) 3.848 Volume: (cm) 0.385 % Reduction in Area: 27.4% % Reduction in Volume: 63.7% Epithelialization: None Tunneling: No Undermining: No Wound Description Classification: Full Thickness Without Exposed Suppor Exudate Amount: Medium Exudate Type: Serosanguineous Exudate Color: red, brown t Structures Foul Odor After Cleansing: No Slough/Fibrino Yes Wound Bed Granulation Amount: Medium (34-66%) Exposed  Structure Granulation Quality: Red, Pink Fascia Exposed: No Necrotic Amount: Medium (34-66%) Fat Layer (Subcutaneous Tissue) Exposed: Yes Necrotic Quality: Adherent Slough Tendon Exposed: No Muscle Exposed: No Joint Exposed: No Bone Exposed: No Treatment Notes Wound #1 (Toe Great) Wound Laterality: Right, Lateral Sparkman, Nayda ([id number]) [id number] Page 6 of 8 Cleanser Byram Ancillary Kit - 15 Day Supply Discharge Instruction: Use supplies as instructed; Kit contains: (15) Saline Bullets; (15) 3x3 Gauze; 15 pr Gloves Soap and Water Discharge Instruction: Gently cleanse wound with antibacterial soap, rinse and pat dry prior to dressing wounds Peri-Wound Care Topical Primary Dressing Hydrofera Blue Ready Transfer Foam, 2.5x2.5 (in/in) Discharge Instruction: Apply Hydrofera Blue Ready to wound bed as directed Secondary Dressing Gauze Discharge Instruction: As directed: dry, for padding Kerlix 4.5 x 4.1 (in/yd) Discharge Instruction: Apply Kerlix 4.5 x 4.1 (in/yd) as instructed Secured With Medipore T - 65M Medipore H Soft Cloth Surgical T ape ape, 2x2 (in/yd) Compression Wrap Compression Stockings Add-Ons Electronic Signature(s) Signed: 04/07/2023 3:41:48 PM By: Angelina Pih Entered By: Angelina Pih on 04/07/2023 14:15:23 -------------------------------------------------------------------------------- Wound Assessment Details Patient Name: Date of Service: [name], [name] 04/07/2023 2:00 PM Medical Record Number: [id number] Patient Account Number: [id number] Date of Birth/Sex: Treating RN: 06-30-1951 (72 y.o. [name] Primary Care Cypress Fanfan: PA Zenovia Jordan, [place] Other Clinician: Referring Saya Mccoll: Treating Rhodie Cienfuegos/Extender: Riccardo Dubin in Treatment: 7 Wound Status Wound Number: 2 Primary Neuropathic Ulcer-Non Diabetic Etiology: Wound Location: Right, Lateral Foot Wound Open Wounding Event: Pressure  Injury Status: Date Acquired: 12/29/2022  Comorbid Asthma, Chronic Obstructive Pulmonary Disease (COPD), Weeks Of Treatment: 7 History: Coronary Artery Disease Clustered Wound: No Pending Amputation On Presentation Photos Wound Measurements Length: (cm) 1.5 Width: (cm) 1 Depth: (cm) 0.1 Area: (cm) 1.178 Volume: (cm) 0.118 Peedin, Destinee ([id number]) Wound Description Classification: Full Thickness Without Exposed Support Structures Exudate Amount: Medium Exudate Type: Serosanguineous Exudate Color: red, brown Foul Odor After Cleansing: No Slough/Fibrino Yes % Reduction in Area: 50% % Reduction in Volume: 74.9% Epithelialization: None Tunneling: No [id number] Page 7 of 8 Wound Bed Granulation Amount: Large (67-100%) Exposed Structure Granulation Quality: Red, Pink Fascia Exposed: No Necrotic Amount: Small (1-33%) Fat Layer (Subcutaneous Tissue) Exposed: Yes Necrotic Quality: Adherent Slough Tendon Exposed: No Muscle Exposed: No Joint Exposed: No Bone Exposed: No Assessment Notes surrounding skin appears macerated, stayed to moist. Treatment Notes Wound #2 (Foot) Wound Laterality: Right, Lateral Cleanser Soap and Water Discharge Instruction: Gently cleanse wound with antibacterial soap, rinse and pat dry prior to dressing wounds Peri-Wound Care Topical Primary Dressing Hydrofera Blue Ready Transfer Foam, 2.5x2.5 (in/in) Discharge Instruction: Apply Hydrofera Blue Ready to wound bed as directed Secondary Dressing Gauze Discharge Instruction: As directed: dry Secured With Medipore T - 72M Medipore H Soft Cloth Surgical T ape ape, 2x2 (in/yd) Kerlix Roll Sterile or Non-Sterile 6-ply 4.5x4 (yd/yd) Discharge Instruction: Apply Kerlix as directed Compression Wrap Compression Stockings Add-Ons Electronic Signature(s) Signed: 04/07/2023 3:41:48 PM By: Angelina Pih Entered By: Angelina Pih on 04/07/2023  14:16:53 -------------------------------------------------------------------------------- Vitals Details Patient Name: Date of Service: [name], [name] 04/07/2023 2:00 PM Medical Record Number: [id number] Patient Account Number: [id number] Date of Birth/Sex: Treating RN: 1951-11-03 (72 y.o. [name] Primary Care Chantee Cerino: PA TIENT, [place] Other Clinician: Referring Robert Sperl: Treating Javid Kemler/Extender: Riccardo Dubin in Treatment: 7 Vital Signs Time Taken: 14:06 Temperature (F): 98.2 Height (in): 62 Pulse (bpm): 76 Weight (lbs): 162 Respiratory Rate (breaths/min): 18 Body Mass Index (BMI): 29.6 Blood Pressure (mmHg): 121/73 Reference Range: 80 - 120 mg / dl Electronic Signature(s) Kuyper, Azhar ([id number]) [id number] Page 8 of 8 Signed: 04/07/2023 3:41:48 PM By: Angelina Pih Entered By: Angelina Pih on 04/07/2023 14:06:53

## 2023-04-14 ENCOUNTER — Ambulatory Visit: Payer: Medicare Other | Admitting: Physician Assistant

## 2023-04-21 ENCOUNTER — Encounter: Payer: Medicare Other | Admitting: Physician Assistant

## 2023-04-21 DIAGNOSIS — G603 Idiopathic progressive neuropathy: Secondary | ICD-10-CM | POA: Diagnosis not present

## 2023-04-22 NOTE — Progress Notes (Signed)
[place], Lenise Herald ([id number]) [id number] Page 1 of 9 Visit Report for 04/21/2023 Arrival Information Details Patient Name: Date of Service: [name], [name] 04/21/2023 10:45 A M Medical Record Number: [id number] Patient Account Number: [id number] Date of Birth/Sex: Treating RN: [birth year] (72 y.o. Ginette Pitman Primary Care Yisell Sprunger: PA Zenovia Jordan, NO Other Clinician: Betha Loa Referring Kasin Tonkinson: Treating Valery Chance/Extender: Riccardo Dubin in Treatment: 9 Visit Information History Since Last Visit All ordered tests and consults were completed: No Patient Arrived: Wheel Chair Added or deleted any medications: No Arrival Time: 10:57 Any new allergies or adverse reactions: No Transfer Assistance: EasyPivot Patient Lift Had a fall or experienced change in No Patient Identification Verified: Yes activities of daily living that may affect Secondary Verification Process Completed: Yes risk of falls: Patient Requires Transmission-Based Precautions: No Signs or symptoms of abuse/neglect since No Patient Has Alerts: Yes last visito Patient Alerts: Patient on Blood Thinner Hospitalized since last visit: No PLAVIX Implantable device outside of the clinic No ASA 81 mg excluding R ABI 0.70 TBI 0.34 cellular tissue based products placed in the L ABI 0.76 TBI 0.00 center since last visit: Has Dressing in Place as Prescribed: Yes Has Footwear/Offloading in Place as Yes Prescribed: Right: Surgical Shoe with Pressure Relief Insole Pain Present Now: Yes Electronic Signature(s) Signed: 04/21/2023 5:12:01 PM By: Betha Loa Entered By: Betha Loa on 04/21/2023 11:01:26 -------------------------------------------------------------------------------- Clinic Level of Care Assessment Details Patient Name: Date of Service: [name], [name] 04/21/2023 10:45 A M Medical Record Number: [id number] Patient Account Number: [id number] Date of Birth/Sex:  Treating RN: February 06, 1951 (72 y.o. Ginette Pitman Primary Care Jennifr Gaeta: PA Zenovia Jordan, NO Other Clinician: Betha Loa Referring Madline Oesterling: Treating Bryer Gottsch/Extender: Riccardo Dubin in Treatment: 9 Clinic Level of Care Assessment Items TOOL 1 Quantity Score []  - 0 Use when EandM and Procedure is performed on INITIAL visit ASSESSMENTS - Nursing Assessment / Reassessment []  - 0 General Physical Exam (combine w/ comprehensive assessment (listed just below) when performed on new pt. evals) []  - 0 Comprehensive Assessment (HX, ROS, Risk Assessments, Wounds Hx, etc.) ASSESSMENTS - Wound and Skin Assessment / Reassessment []  - 0 Dermatologic / Skin Assessment (not related to wound area) ASSESSMENTS - Ostomy and/or Continence Assessment and Care []  - 0 Incontinence Assessment and Management []  - 0 Ostomy Care Assessment and Management (repouching, etc.) PROCESS - Coordination of Care []  - 0 Simple Patient / Family Education for ongoing care []  - 0 Complex (extensive) Patient / Family Education for ongoing care []  - 0 Staff obtains Chiropractor, Records, T Results / Process Orders est []  - 0 Staff telephones HHA, Nursing Homes / Clarify orders / etc Carmichael, Felipe ([id number]) [phone].pdf Page 2 of 9 []  - 0 Routine Transfer to another Facility (non-emergent condition) []  - 0 Routine Hospital Admission (non-emergent condition) []  - 0 New Admissions / Manufacturing engineer / Ordering NPWT Apligraf, etc. , []  - 0 Emergency Hospital Admission (emergent condition) PROCESS - Special Needs []  - 0 Pediatric / Minor Patient Management []  - 0 Isolation Patient Management []  - 0 Hearing / Language / Visual special needs []  - 0 Assessment of Community assistance (transportation, D/C planning, etc.) []  - 0 Additional assistance / Altered mentation []  - 0 Support Surface(s) Assessment (bed, cushion, seat, etc.) INTERVENTIONS -  Miscellaneous []  - 0 External ear exam []  - 0 Patient Transfer (multiple staff / Nurse, adult / Similar devices) []  - 0 Simple Staple / Suture removal (25 or less) []  - 0 Complex  Staple / Suture removal (26 or more) []  - 0 Hypo/Hyperglycemic Management (do not check if billed separately) []  - 0 Ankle / Brachial Index (ABI) - do not check if billed separately Has the patient been seen at the hospital within the last three years: Yes Total Score: 0 Level Of Care: ____ Electronic Signature(s) Signed: 04/21/2023 5:12:01 PM By: Betha Loa Entered By: Betha Loa on 04/21/2023 12:06:20 -------------------------------------------------------------------------------- Encounter Discharge Information Details Patient Name: Date of Service: [name], [name] 04/21/2023 10:45 A M Medical Record Number: [id number] Patient Account Number: [id number] Date of Birth/Sex: Treating RN: 1951/08/31 (72 y.o. Ginette Pitman Primary Care Kamyra Schroeck: PA Zenovia Jordan, NO Other Clinician: Betha Loa Referring Emelda Kohlbeck: Treating Shanteria Laye/Extender: Riccardo Dubin in Treatment: 9 Encounter Discharge Information Items Post Procedure Vitals Discharge Condition: Stable Temperature (F): 98.2 Ambulatory Status: Wheelchair Pulse (bpm): 77 Discharge Destination: Home Respiratory Rate (breaths/min): 18 Transportation: Private Auto Blood Pressure (mmHg): 126/76 Accompanied By: daughter Schedule Follow-up Appointment: Yes Clinical Summary of Care: Electronic Signature(s) Signed: 04/21/2023 5:12:01 PM By: Betha Loa Entered By: Betha Loa on 04/21/2023 13:49:50 -------------------------------------------------------------------------------- Lower Extremity Assessment Details Patient Name: Date of Service: [name], [name] 04/21/2023 10:45 A M Medical Record Number: [id number] Patient Account Number: [id number] Date of Birth/Sex: Treating RN: May 09, 1951 (72 y.o. Ginette Pitman Primary Care  Erinn Mendosa: PA Zenovia Jordan, Victoria [place] Other Clinician: Betha Loa Referring Cailan General: Treating Alya Smaltz/Extender: Shella Maxim Secretary, [place] ([id number]) [phone].pdf Page 3 of 9 Weeks in Treatment: 9 Edema Assessment Assessed: [Left: No] [Right: Yes] Edema: [Left: Ye] [Right: s] Calf Left: Right: Point of Measurement: 31 cm From Medial Instep 34.2 cm Ankle Left: Right: Point of Measurement: 12 cm From Medial Instep 22.1 cm Vascular Assessment Pulses: Dorsalis Pedis Palpable: [Right:Yes] Electronic Signature(s) Signed: 04/21/2023 4:35:08 PM By: Midge Aver MSN RN CNS WTA Signed: 04/21/2023 5:12:01 PM By: Betha Loa Entered By: Betha Loa on 04/21/2023 11:13:27 -------------------------------------------------------------------------------- Multi Wound Chart Details Patient Name: Date of Service: [name], [name] 04/21/2023 10:45 A M Medical Record Number: [id number] Patient Account Number: [id number] Date of Birth/Sex: Treating RN: Jul 08, 1951 (72 y.o. 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 Primary Etiology: Asthma, Chronic Obstructive Asthma, Chronic Obstructive N/A Comorbid History: Pulmonary Disease (COPD), Coronary Pulmonary Disease (COPD), Coronary Artery Disease Artery Disease 12/29/2022 12/29/2022 N/A Date Acquired: 9 9 N/A Weeks of Treatment: Open Open N/A Wound Status: No No N/A Wound Recurrence: Yes Yes  N/A Pending A mputation on Presentation: 2x1x0.1 0.7x0.6x0.1 N/A Measurements L x W x D (cm) 1.571 0.33 N/A A (cm) : rea 0.157 0.033 N/A Volume (cm) : 70.40% 86.00% N/A % Reduction in A rea: 85.20% 93.00% N/A % Reduction in Volume: Full Thickness Without Exposed Full Thickness Without Exposed N/A ClassificationDASHAE, WILCHER ([id number]) [id number] Page 4 of 9 Support Structures Support Structures Medium Medium N/A Exudate Amount: Serosanguineous Serosanguineous N/A Exudate Type: red, brown red, brown N/A Exudate Color: Medium (34-66%) Large (67-100%) N/A Granulation Amount: Red, Pink Red, Pink N/A Granulation Quality: Medium (34-66%) Small (1-33%) N/A Necrotic Amount: Fat Layer (Subcutaneous Tissue): Yes Fat Layer (Subcutaneous Tissue): Yes N/A Exposed Structures: Fascia: No Fascia: No  Tendon: No Tendon: No Muscle: No Muscle: No Joint: No Joint: No Bone: No Bone: No None None N/A Epithelialization: Treatment Notes Electronic Signature(s) Signed: 04/21/2023 5:12:01 PM By: Betha Loa Entered By: Betha Loa on 04/21/2023 11:13:36 -------------------------------------------------------------------------------- Multi-Disciplinary Care Plan Details Patient Name: Date of Service: [name], [name] 04/21/2023 10:45 A M Medical Record Number: [id number] Patient Account Number: [id number] Date of Birth/Sex: Treating RN: 16-Jun-1951 (72 y.o. Ginette Pitman Primary Care Electa Sterry: PA Zenovia Jordan, NO Other Clinician: Betha Loa Referring Almetta Liddicoat: Treating Makyah Lavigne/Extender: Riccardo Dubin in Treatment: 9 Active Inactive Wound/Skin Impairment Nursing Diagnoses: Impaired tissue integrity Knowledge deficit related to ulceration/compromised skin integrity Goals: Patient will have a decrease in wound volume by X% from date: (specify in notes) Date Initiated: 02/16/2023 Target Resolution Date: 04/20/2023 Goal Status:  Active Patient/caregiver will verbalize understanding of skin care regimen Date Initiated: 02/16/2023 Date Inactivated: 03/03/2023 Target Resolution Date: 03/17/2023 Goal Status: Met Ulcer/skin breakdown will have a volume reduction of 30% by week 4 Date Initiated: 02/16/2023 Date Inactivated: 03/24/2023 Target Resolution Date: 03/17/2023 Goal Status: Met Ulcer/skin breakdown will have a volume reduction of 50% by week 8 Date Initiated: 02/16/2023 Target Resolution Date: 04/17/2023 Goal Status: Active Ulcer/skin breakdown will have a volume reduction of 80% by week 12 Date Initiated: 02/16/2023 Target Resolution Date: 05/17/2023 Goal Status: Active Interventions: Assess patient/caregiver ability to obtain necessary supplies Assess patient/caregiver ability to perform ulcer/skin care regimen upon admission and as needed Assess ulceration(s) every visit Provide education on ulcer and skin care Treatment Activities: Referred to DME Prayan Ulin for dressing supplies : 02/16/2023 Skin care regimen initiated : 02/16/2023 Notes: Electronic Signature(s) Signed: 04/21/2023 4:35:08 PM By: Midge Aver MSN RN CNS WTA Signed: 04/21/2023 5:12:01 PM By: Antoine Primas, Anaia 04/21/2023 5:12:01 PM By: Betha Loa Signed: ([id number]) [id number] Page 5 of 9 Entered By: Betha Loa on 04/21/2023 12:06:30 -------------------------------------------------------------------------------- Pain Assessment Details Patient Name: Date of Service: [name], [name] 04/21/2023 10:45 A M Medical Record Number: [id number] Patient Account Number: [id number] Date of Birth/Sex: Treating RN: 30-Dec-1950 (72 y.o. Ginette Pitman Primary Care Izadora Roehr: PA Zenovia Jordan, Victoria [place] Other Clinician: Betha Loa Referring Bren Steers: Treating Adger Cantera/Extender: Riccardo Dubin in Treatment: 9 Active Problems Location of Pain Severity and Description of Pain Patient Has Paino No Site  Locations Duration of the Pain. Constant / Intermittento Intermittent Rate the pain. Current Pain Level: 0 Worst Pain Level: 3 Character of Pain Describe the Pain: Aching Pain Management and Medication Current Pain Management: Medication: Yes Cold Application: No Rest: No Massage: No Activity: No T.E.N.S.: No Heat Application: No Leg drop or elevation: No Is the Current Pain Management Adequate: Inadequate How does your wound impact your activities of daily livingo Sleep: No Bathing: No Appetite: No Relationship With Others: No Bladder Continence: No Emotions: No Bowel Continence: No Work: No Toileting: No Drive: No Dressing: No Hobbies: No Psychologist, prison and probation services) Signed: 04/21/2023 4:35:08 PM By: Midge Aver MSN RN CNS WTA Signed: 04/21/2023 5:12:01 PM By: Betha Loa Entered By: Betha Loa on 04/21/2023 11:04:19 -------------------------------------------------------------------------------- Patient/Caregiver Education Details Patient Name: Date of Service: [name], Hiroko 4/30/2024andnbsp10:45 A M Medical Record Number: [id number] Patient Account Number: [id number] Date of Birth/Gender: Treating RN: 04-27-1951 (72 y.o. Ginette Pitman Primary Care Physician: PA Zenovia Jordan, Victoria [place] Other Clinician: Betha Loa Referring Physician: Treating Physician/Extender: Riccardo Dubin in Treatment: [address], [place] ([id number]) [id number] Page 6 of 9 Education Assessment Education Provided To: Patient Education Topics Provided Wound/Skin Impairment: Handouts: Other: continue wound  care as directed Methods: Explain/Verbal Responses: State content correctly Electronic Signature(s) Signed: 04/21/2023 5:12:01 PM By: Betha Loa Entered By: Betha Loa on 04/21/2023 13:49:01 -------------------------------------------------------------------------------- Wound Assessment Details Patient Name: Date of Service: [name], [name] 04/21/2023 10:45 A M Medical Record Number: [id number] Patient Account Number: [id number] Date of Birth/Sex: Treating RN: 1951/01/26 (72 y.o. 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, Lateral T Great oe Wound Open Wounding Event: Gradually Appeared Status: Date Acquired: 12/29/2022 Comorbid Asthma, Chronic Obstructive Pulmonary Disease (COPD), Weeks Of Treatment: 9 History: Coronary Artery Disease Clustered Wound: No Pending Amputation On Presentation Photos Wound Measurements Length: (cm) 2 Width: (cm) 1 Depth: (cm) 0.1 Area: (cm) 1.571 Volume: (cm) 0.157 % Reduction in Area: 70.4% % Reduction in Volume: 85.2% Epithelialization: None Wound Description Classification: Full Thickness Without Exposed Suppor Exudate Amount: Medium Exudate Type: Serosanguineous Exudate Color: red, brown t Structures Foul Odor After Cleansing: No Slough/Fibrino Yes Wound Bed Granulation Amount: Medium (34-66%) Exposed Structure Granulation Quality: Red, Pink Fascia Exposed: No Necrotic Amount: Medium (34-66%) Fat Layer (Subcutaneous Tissue) Exposed: Yes Necrotic Quality: Adherent Slough Tendon Exposed: No Muscle Exposed: No Joint Exposed: No [name], [name] ([id number]) [id number] Page 7 of 9 Bone Exposed: No Treatment Notes Wound #1 (Toe Great) Wound Laterality: Right, Lateral Cleanser Byram Ancillary Kit - 15 Day Supply Discharge Instruction: Use supplies as instructed; Kit contains: (15) Saline Bullets; (15) 3x3 Gauze; 15 pr Gloves Soap and Water Discharge Instruction: Gently cleanse wound with antibacterial soap, rinse and pat dry prior to dressing wounds Peri-Wound Care Topical Primary Dressing Hydrofera  Blue Ready Transfer Foam, 2.5x2.5 (in/in) Discharge Instruction: Apply Hydrofera Blue Ready to wound bed as directed Secondary Dressing Gauze Discharge Instruction: As directed: dry, for padding Kerlix 4.5 x 4.1 (in/yd) Discharge Instruction: Apply Kerlix 4.5 x 4.1 (in/yd) as instructed Secured With Medipore T - 29M Medipore H Soft Cloth Surgical T ape ape, 2x2 (in/yd) Compression Wrap Compression Stockings Add-Ons Electronic Signature(s) Signed: 04/21/2023 4:35:08 PM By: Midge Aver MSN RN CNS WTA Signed: 04/21/2023 5:12:01 PM By: Betha Loa Entered By: Betha Loa on 04/21/2023 11:11:40 -------------------------------------------------------------------------------- Wound Assessment Details Patient Name: Date of Service: [name], [name] 04/21/2023 10:45 A M Medical Record Number: [id number] Patient Account Number: [id number] Date of Birth/Sex: Treating RN: December 15, 1951 (72 y.o. 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, Lateral Foot Wound Open Wounding Event: Pressure Injury Status: Date Acquired: 12/29/2022 Comorbid Asthma, Chronic Obstructive Pulmonary Disease (COPD), Weeks Of Treatment: 9 History: Coronary Artery Disease Clustered Wound: No Pending Amputation On Presentation Photos [name], [name] ([id number]) [id number] Page 8 of 9 Wound Measurements Length: (cm) 0.7 Width: (cm) 0.6 Depth: (cm) 0.1 Area: (cm) 0.33 Volume: (cm) 0.033 % Reduction in Area: 86% % Reduction in Volume: 93% Epithelialization: None Wound Description Classification: Full Thickness Without Exposed Sup Exudate Amount: Medium Exudate Type: Serosanguineous Exudate Color: red, brown port Structures Foul Odor After Cleansing:  No Slough/Fibrino Yes Wound Bed Granulation Amount: Large (67-100%) Exposed Structure Granulation Quality: Red, Pink Fascia Exposed: No Necrotic Amount: Small (1-33%) Fat Layer (Subcutaneous Tissue) Exposed: Yes Necrotic Quality: Adherent Slough Tendon Exposed: No Muscle Exposed: No Joint Exposed: No Bone Exposed: No  Treatment Notes Wound #2 (Foot) Wound Laterality: Right, Lateral Cleanser Soap and Water Discharge Instruction: Gently cleanse wound with antibacterial soap, rinse and pat dry prior to dressing wounds Peri-Wound Care Topical Primary Dressing Hydrofera Blue Ready Transfer Foam, 2.5x2.5 (in/in) Discharge Instruction: Apply Hydrofera Blue Ready to wound bed as directed Secondary Dressing Gauze Discharge Instruction: As directed: dry Secured With Medipore T - 64M Medipore H Soft Cloth Surgical T ape ape, 2x2 (in/yd) Kerlix Roll Sterile or Non-Sterile 6-ply 4.5x4 (yd/yd) Discharge Instruction: Apply Kerlix as directed Compression Wrap Compression Stockings Add-Ons Electronic Signature(s) Signed: 04/21/2023 4:35:08 PM By: Midge Aver MSN RN CNS WTA Signed: 04/21/2023 5:12:01 PM By: Betha Loa Entered By: Betha Loa on 04/21/2023 11:12:07 -------------------------------------------------------------------------------- Vitals Details Patient Name: Date of Service: [name], [name] 04/21/2023 10:45 A M Medical Record Number: [id number] Patient Account Number: [id number] Date of Birth/Sex: Treating RN: Apr 20, 1951 (72 y.o. Ginette Pitman Primary Care Eilam Shrewsbury: PA TIENT, NO Other Clinician: Betha Loa Referring Ramadan Couey: Treating Vaunda Gutterman/Extender: Riccardo Dubin in Treatment: 9 Vital Signs Time Taken: 11:02 Temperature (F): 98.2 Height (in): 62 Pulse (bpm): 77 Weight (lbs): 162 Respiratory Rate (breaths/min): 18 [name], Brielynn ([id number]) [id number] Page 9 of 9 Body Mass Index (BMI): 29.6 Blood Pressure  (mmHg): 126/76 Reference Range: 80 - 120 mg / dl Electronic Signature(s) Signed: 04/21/2023 5:12:01 PM By: Betha Loa Entered By: Betha Loa on 04/21/2023 11:04:13

## 2023-04-22 NOTE — Progress Notes (Signed)
[place], [name] ([id number]) [id number] Page 1 of 8 Visit Report for 04/21/2023 Chief Complaint Document Details Patient Name: Date of Service: [name], [name] 04/21/2023 10:45 A M Medical Record Number: [id number] Patient Account Number: [id number] Date of Birth/Sex: Treating RN: 09-22-1951 (72 y.o. Ginette Pitman Primary Care Provider: PA Zenovia Jordan, [place] Other Clinician: Betha Loa Referring Provider: Treating Provider/Extender: Riccardo Dubin in Treatment: 9 Information Obtained from: Patient Chief Complaint Right foot ulcers Electronic Signature(s) Signed: 04/21/2023 11:09:08 AM By: Allen Derry PA-C Entered By: Allen Derry on 04/21/2023 11:09:07 -------------------------------------------------------------------------------- Debridement Details Patient Name: Date of Service: [name], Jettie 04/21/2023 10:45 A M Medical Record Number: [id number] Patient Account Number: [id number] Date of Birth/Sex: Treating RN: 1951/03/11 (72 y.o. Ginette Pitman Primary Care Provider: PA Zenovia Jordan, NO Other Clinician: Betha Loa Referring Provider: Treating Provider/Extender: Riccardo Dubin in Treatment: 9 Debridement Performed for Assessment: Wound #1 Right,Lateral T Great oe Performed By: Physician Allen Derry, PA-C Debridement Type: Debridement Level of Consciousness (Pre-procedure): Awake and Alert Pre-procedure Verification/Time Out Yes - 11:53 Taken: Start Time: 11:53 Percent of Wound Bed Debrided: 100% T Area Debrided (cm): otal 1.57 Tissue and other material debrided: Viable, Non-Viable, Callus, Slough, Subcutaneous, Slough Level: Skin/Subcutaneous Tissue Debridement Description: Excisional Instrument: Curette Bleeding: Minimum Hemostasis Achieved: Pressure Response to Treatment: Procedure was tolerated well Level of Consciousness (Post- Awake and Alert procedure): Post Debridement Measurements of Total  Wound Length: (cm) 2 Width: (cm) 1 Depth: (cm) 0.1 Volume: (cm) 0.157 Character of Wound/Ulcer Post Debridement: Stable Post Procedure Diagnosis Same as Pre-procedure Electronic Signature(s) Signed: 04/21/2023 4:35:08 PM By: Midge Aver MSN RN CNS WTA Signed: 04/21/2023 5:12:01 PM By: Betha Loa Signed: 04/21/2023 6:18:39 PM By: Allen Derry PA-C Entered By: Betha Loa on 04/21/2023 12:03:31 [place], [name] ([id number]) [id number] Page 2 of 8 -------------------------------------------------------------------------------- Debridement Details Patient Name: Date of Service: Rowland, WHITERS 04/21/2023 10:45 A M Medical Record Number: [id number] Patient Account Number: [id number] Date of Birth/Sex: Treating RN: Sep 25, 1951 (72 y.o. Ginette Pitman Primary Care Provider: PA Zenovia Jordan, NO Other Clinician: Betha Loa Referring Provider: Treating Provider/Extender: Riccardo Dubin in Treatment: 9 Debridement Performed for Assessment: Wound #2 Right,Lateral Foot Performed By: Physician Allen Derry, PA-C Debridement Type: Debridement Level of Consciousness (Pre-procedure): Awake and Alert Pre-procedure Verification/Time Out Yes - 11:58 Taken: Start Time: 11:58 Percent of Wound Bed Debrided: 100% T Area Debrided (cm): otal 0.33 Tissue and other material debrided: Viable, Non-Viable, Callus, Slough, Subcutaneous, Slough Level: Skin/Subcutaneous Tissue Debridement Description: Excisional Instrument: Curette Bleeding: Minimum Hemostasis Achieved: Pressure Response to Treatment: Procedure was tolerated well Level of Consciousness (Post- Awake and Alert procedure): Post Debridement Measurements of Total Wound Length: (cm) 0.7 Width: (cm) 0.6 Depth: (cm) 0.2 Volume: (cm) 0.066 Character of Wound/Ulcer Post Debridement: Stable Post Procedure Diagnosis Same as Pre-procedure Electronic Signature(s) Signed: 04/21/2023 4:35:08 PM By:  Midge Aver MSN RN CNS WTA Signed: 04/21/2023 5:12:01 PM By: Betha Loa Signed: 04/21/2023 6:18:39 PM By: Allen Derry PA-C Entered By: Betha Loa on 04/21/2023 12:05:45 -------------------------------------------------------------------------------- HPI Details Patient Name: Date of Service: [name], Victoria 04/21/2023 10:45 A M Medical Record Number: [id number] Patient Account Number: [id number] Date of Birth/Sex: Treating RN: [birth year] (72 y.o. Ginette Pitman Primary Care Provider: PA Zenovia Jordan, [place] Other Clinician: Betha Loa Referring Provider: Treating Provider/Extender: Riccardo Dubin in Treatment: 9 History of Present Illness HPI Description: 02-16-2023 upon evaluation today patient presents for initial inspection here in our clinic concerning issues that she has been  having with wounds over the right foot in the realm of several toes as well as the lateral portion of her foot fifth metatarsal location. Subsequently she unfortunately has somewhat poor arterial flow. I do have notes that I reviewed in epic and subsequently the patient did have an angiogram as well back in January. The testing following the angiogram revealed after stent placement on 01-09-2023 that she subsequently still had somewhat compromised arterial flow following with a right ABI of 0.70 with a TBI of 0.34 and on the left an ABI of 0.76 and the TBI could not be measured. 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 With that being said the follow-up as far as her arterial testing they did not do ABIs and TBI's but it showed that she was monophasic pretty much throughout and in general I think this still shows that there is a significant issue here and the vascular doctors seem to be in agreement as far as this is concerned they have recommended that she really needs to have repeat arterial angiogram with likely intervention to try to improve that monophasic flow. With that being said the patient is  somewhat reluctant to do this though I think that she is leaning towards it even though she had a lot of bleeding issues in the groin area following the last time she had this done. In regard to her wound she is actually having quite a bit of an issue here with these wounds specifically in the realm of getting them to turn around and heal appropriately. She tells me that the areas are looking much better than they were but nonetheless she still is noticing that the Betadine seems to be keeping things a little bit dry but some areas are starting to drain a lot more unfortunately. She is on aspirin and Plavix following the stent placement. This is probably part of the bleeding issues that she had previously in the groin to be peripherally honest. She does have a history of COPD and coronary artery disease as well. 3/5; patient is in for her second clinic visit. She has significant PAD status post revascularization a month ago. According to the patient she is being scheduled for another 1 in a month's time. She has 4 wounds on the right foot lateral foot proximal foot fourth toe and the medial part of the right first toe [place], [name] ([id number]) [id number] Page 3 of 8 3/12; patient presents for follow-up. She has been using Xeroform to the wound beds. She has no issues or complaints today. 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 03-10-2023 upon evaluation today patient presents for follow-up concerning her ongoing issues with her right lower extremity ulcers on foot. She has not seen vascular yet they called them but they have not actually gotten her scheduled. They state they are to call back and see if they can get things moving along. With that being said she does seem to be making some improvements with regard to her toes and I am very pleased in that regard I feel like the wounds are looking better today. 3B-26-24 upon evaluation  today patient appears to be doing well currently in regard to her wounds all things considered I definitely see signs of improvement which is great news. Fortunately there does not appear to be any evidence of active infection locally nor systemically which is great news. No fevers, chills, nausea, vomiting, or diarrhea. 03-24-2023 upon evaluation today patient appears to be doing decently well  in regard to her ulcers on her foot. Were down to just 2 and both are showing signs of improvement. Fortunately I do not see any evidence of active infection locally nor systemically which is great news. 03-31-2023 upon evaluation today patient's wounds actually are showing signs of improvement. I think both are doing well with the [facility] and I am very pleased in that regard. I do not see any signs of infection locally nor systemically which is great news. 04-07-2023 upon evaluation today patient actually seems to be making good progress here with regard to her wounds. She has been tolerating the dressing changes without complication. Fortunately there does not appear to be any signs of active infection locally nor systemically at this time which is great news. No fevers, chills, nausea, vomiting, or diarrhea. 04-21-2023 upon evaluation today patient appears to be doing well currently in regard to her wound in fact both wounds are measuring smaller and looking much better there is good to be some need for sharp debridement but in general I think that she is really doing quite well. Electronic Signature(s) Signed: 04/21/2023 6:16:12 PM By: Allen Derry PA-C Entered By: Allen Derry on 04/21/2023 18:16:11 -------------------------------------------------------------------------------- Physical Exam Details Patient Name: Date of Service: ADRA, SHEPLER 04/21/2023 10:45 A M Medical Record Number: [id number] Patient Account Number: [id number] Date of Birth/Sex: Treating RN: [birth year] (72 y.o. Ginette Pitman Primary Care Provider: PA Fidela Juneau Other Clinician: Betha Loa Referring Provider: Treating Provider/Extender: Riccardo Dubin in Treatment: 9 Constitutional Well-nourished and well-hydrated in no acute distress. Respiratory normal breathing without difficulty. Psychiatric this patient is able to make decisions and demonstrates good insight into disease process. Alert and Oriented x 3. pleasant and cooperative. Notes Upon inspection patient's wound bed actually showed signs of good granulation epithelization at this point. Fortunately I do not see any evidence of worsening in general I think that the patient is making really good progress here. Electronic Signature(s) Signed: 04/21/2023 6:16:25 PM By: Allen Derry PA-C Entered By: Allen Derry on 04/21/2023 18:16:25 -------------------------------------------------------------------------------- Physician Orders Details Patient Name: Date of Service: [name], Seena 04/21/2023 10:45 A M Medical Record Number: [id number] Patient Account Number: [id number] Date of Birth/Sex: Treating RN: 12/02/1951 (72 y.o. 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 Bathing/ [employer] wounds with  antibacterial soap and water. Anesthetic (Use 'Patient Medications' Section for Anesthetic Order Entry) Lidocaine applied to wound bed Edema Control - Lymphedema / Segmental Compressive Device / Other Elevate, Exercise Daily and A void Standing for Long Periods of Time. Elevate legs to the level of the heart and pump ankles as often as possible Elevate leg(s) parallel to the floor when sitting. DO YOUR BEST to sleep in the bed at night. DO NOT sleep in your recliner. Long hours of sitting in a recliner leads to swelling of the legs and/or potential wounds on your backside. Wound Treatment Wound #1 - T Great oe Wound  Laterality: Right, Lateral Cleanser: [employer] Ancillary Kit - 15 Day Supply (Generic) 3 x Per Week/30 Days Discharge Instructions: Use supplies as instructed; Kit contains: (15) Saline Bullets; (15) 3x3 Gauze; 15 pr Gloves Cleanser: Soap and Water 3 x Per Week/30 Days Discharge Instructions: Gently cleanse wound with antibacterial soap, rinse and pat dry prior to dressing wounds Prim Dressing: Hydrofera Blue Ready Transfer Foam, 2.5x2.5 (in/in) 3 x Per Week/30 Days ary Discharge Instructions: Apply Hydrofera Blue Ready to wound bed as directed Secondary Dressing: Gauze 3 x Per Week/30 Days Discharge Instructions: As directed: dry, for padding Secondary Dressing: Kerlix 4.5 x 4.1 (in/yd) 3 x Per Week/30 Days Discharge Instructions: Apply Kerlix 4.5 x 4.1 (in/yd) as instructed Secured With: Medipore T - 76M Medipore H Soft Cloth Surgical T ape ape, 2x2 (in/yd) (Generic) 3 x Per Week/30 Days Wound #2 - Foot Wound Laterality: Right, Lateral Cleanser: Soap and Water 3 x Per Week/30 Days Discharge Instructions: Gently cleanse wound with antibacterial soap, rinse and pat dry prior to dressing wounds Prim Dressing: Hydrofera Blue Ready Transfer Foam, 2.5x2.5 (in/in) 3 x Per Week/30 Days ary Discharge Instructions: Apply Hydrofera Blue Ready to wound bed as directed Secondary Dressing:  Gauze 3 x Per Week/30 Days Discharge Instructions: As directed: dry Secured With: Medipore T - 76M Medipore H Soft Cloth Surgical T ape ape, 2x2 (in/yd) 3 x Per Week/30 Days Secured With: [employer] or Non-Sterile 6-ply 4.5x4 (yd/yd) 3 x Per Week/30 Days Discharge Instructions: Apply Kerlix as directed Electronic Signature(s) Signed: 04/21/2023 5:12:01 PM By: Betha Loa Signed: 04/21/2023 6:18:39 PM By: Allen Derry PA-C Entered By: Betha Loa on 04/21/2023 12:06:14 -------------------------------------------------------------------------------- Problem List Details Patient Name: Date of Service: [name], Krystyna 04/21/2023 10:45 A M Medical Record Number: [id number] Patient Account Number: [id number] Date of Birth/Sex: Treating RN: 17-Jun-1951 (72 y.o. 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 02/16/2023 No Yes L97.512 Non-pressure chronic ulcer of other part of right foot with fat layer exposed 02/16/2023 No Yes Z79.01 Long term (current) use of anticoagulants 02/16/2023 No Yes J44.9 Chronic obstructive pulmonary disease, unspecified 02/16/2023 No Yes I25.10 Atherosclerotic heart disease of native coronary artery without angina pectoris 02/16/2023 No Yes Inactive Problems Resolved Problems Electronic Signature(s) Signed: 04/21/2023 11:09:05 AM By: Allen Derry PA-C Entered By: Allen Derry on 04/21/2023 11:09:05 -------------------------------------------------------------------------------- Progress Note Details Patient Name: Date of Service: [name], Aariah 04/21/2023 10:45 A M Medical Record Number: [id number] Patient Account Number:  [id number] Date of Birth/Sex: Treating RN: 1951/09/18 (72 y.o. Ginette Pitman Primary Care Provider: PA Zenovia Jordan, NO Other Clinician: Betha Loa Referring Provider: Treating Provider/Extender: Riccardo Dubin in Treatment: 9 Subjective Chief Complaint Information obtained from Patient Right foot ulcers History of Present Illness (HPI) 02-16-2023 upon evaluation today patient presents for initial inspection here in our clinic concerning issues that she has been having with wounds over the right foot in the realm of several toes as well as the lateral portion of her foot fifth metatarsal location. Subsequently she unfortunately has somewhat poor arterial flow. I do have notes that I reviewed in epic and subsequently the patient did have an angiogram as well back in January. The testing following the angiogram revealed after stent placement on 01-09-2023 that she subsequently still had somewhat  compromised arterial flow following with a right ABI of 0.70 with a TBI of 0.34 and on the left an ABI of 0.76 and the TBI could not be measured. 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 With that being said the follow-up as far as her arterial testing they did not do ABIs and TBI's but it showed that she was monophasic pretty much throughout and in general I think this still shows that there is a significant issue here and the vascular doctors seem to be in agreement as far as this is concerned they have recommended that she really needs to have repeat arterial angiogram with likely intervention to try to improve that monophasic flow. With that being said the patient is somewhat reluctant to do this though I think that she is leaning towards it even though she had a lot of bleeding issues in the groin area following the last time she had this done. In regard to her wound she is actually having quite a bit of an issue here with these wounds specifically in the realm of getting them to turn  around and heal appropriately. She tells me that the areas are looking much better than they were but nonetheless she still is noticing that the Betadine seems to be keeping things a little bit dry but some areas are starting to drain a lot more unfortunately. She is on aspirin and Plavix following the stent placement. This is probably part of the bleeding issues that she had previously in the groin to be peripherally honest. She does have a history of COPD and coronary artery disease as well. 3/5; patient is in for her second clinic visit. She has significant PAD status post revascularization a month ago. According to the patient she is being scheduled for another 1 in a month's time. She has 4 wounds on the right foot lateral foot proximal foot fourth toe and the medial part of the right first toe 3/12; patient presents for follow-up. She has been using Xeroform to the wound beds. She has no issues or complaints today. 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 03-10-2023 upon evaluation today patient presents for follow-up concerning her ongoing issues with her right lower extremity ulcers on foot. She has not seen vascular yet they called them but they have not actually gotten her scheduled. They state they are to call back and see if they can get things moving along. With that being said she does seem to be making some improvements with regard to her toes and I am very pleased in that regard I feel like the wounds are looking better today. [place], [name] ([id number]) [id number] Page 6 of 8 3B-26-24 upon evaluation today patient appears to be doing well currently in regard to her wounds all things considered I definitely see signs of improvement which is great news. Fortunately there does not appear to be any evidence of active infection locally nor systemically which is great news. No fevers, chills, nausea, vomiting, or  diarrhea. 03-24-2023 upon evaluation today patient appears to be doing decently well in regard to her ulcers on her foot. Were down to just 2 and both are showing signs of improvement. Fortunately I do not see any evidence of active infection locally nor systemically which is great news. 03-31-2023 upon evaluation today patient's wounds actually are showing signs of improvement. I think both are doing well with the [facility] and I am very pleased in that regard. I do not see any  signs of infection locally nor systemically which is great news. 04-07-2023 upon evaluation today patient actually seems to be making good progress here with regard to her wounds. She has been tolerating the dressing changes without complication. Fortunately there does not appear to be any signs of active infection locally nor systemically at this time which is great news. No fevers, chills, nausea, vomiting, or diarrhea. 04-21-2023 upon evaluation today patient appears to be doing well currently in regard to her wound in fact both wounds are measuring smaller and looking much better there is good to be some need for sharp debridement but in general I think that she is really doing quite well. Objective Constitutional Well-nourished and well-hydrated in no acute distress. Vitals Time Taken: 11:02 AM, Height: 62 in, Weight: 162 lbs, BMI: 29.6, Temperature: 98.2 F, Pulse: 77 bpm, Respiratory Rate: 18 breaths/min, Blood Pressure: 126/76 mmHg. Respiratory normal breathing without difficulty. Psychiatric this patient is able to make decisions and demonstrates good insight into disease process. Alert and Oriented x 3. pleasant and cooperative. General Notes: Upon inspection patient's wound bed actually showed signs of good granulation epithelization at this point. Fortunately I do not see any evidence of worsening in general I think that the patient is making really good progress here. Integumentary (Hair, Skin) Wound #1  status is Open. Original cause of wound was Gradually Appeared. The date acquired was: 12/29/2022. The wound has been in treatment 9 weeks. The wound is located on the Right,Lateral T Great. The wound measures 2cm length x 1cm width x 0.1cm depth; 1.571cm^2 area and 0.157cm^3 volume. There is oe Fat Layer (Subcutaneous Tissue) exposed. There is a medium amount of serosanguineous drainage noted. There is medium (34-66%) red, pink granulation within the wound bed. There is a medium (34-66%) amount of necrotic tissue within the wound bed including Adherent Slough. Wound #2 status is Open. Original cause of wound was Pressure Injury. The date acquired was: 12/29/2022. The wound has been in treatment 9 weeks. The wound is located on the Right,Lateral Foot. The wound measures 0.7cm length x 0.6cm width x 0.1cm depth; 0.33cm^2 area and 0.033cm^3 volume. There is Fat Layer (Subcutaneous Tissue) exposed. There is a medium amount of serosanguineous drainage noted. There is large (67-100%) red, pink granulation within the wound bed. There is a small (1-33%) amount of necrotic tissue within the wound bed including Adherent Slough. 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 There was a Excisional Skin/Subcutaneous oe Tissue Debridement with a total area of 1.57 sq cm performed by Allen Derry, PA-C. With the following instrument(s): Curette to remove Viable and Non- Viable tissue/material. Material removed includes Callus, Subcutaneous Tissue, and Slough. A time out was conducted at 11:53, prior to the start of  the procedure. A Minimum amount of bleeding was controlled with Pressure. The procedure was tolerated well. Post Debridement Measurements: 2cm length x 1cm width x 0.1cm depth; 0.157cm^3 volume. Character of Wound/Ulcer Post Debridement is stable. Post procedure Diagnosis Wound #1: Same as Pre-Procedure Wound #2 Pre-procedure diagnosis of Wound #2 is a Neuropathic Ulcer-Non Diabetic located on the Right,Lateral Foot . There was a Excisional Skin/Subcutaneous Tissue PARDUE, Anahy ([id number]) [id number] Page 7 of 8 Debridement  with a total area of 0.33 sq cm performed by Allen Derry, PA-C. With the following instrument(s): Curette to remove Viable and Non-Viable tissue/material. Material removed includes Callus, Subcutaneous Tissue, and Slough. A time out was conducted at 11:58, prior to the start of the procedure. A Minimum amount of bleeding was controlled with Pressure. The procedure was tolerated well. Post Debridement Measurements: 0.7cm length x 0.6cm width x 0.2cm depth; 0.066cm^3 volume. Character of Wound/Ulcer Post Debridement is stable. Post procedure Diagnosis Wound #2: Same as Pre-Procedure Plan Follow-up Appointments: Return Appointment in 1 week. Bathing/ Shower/ Hygiene: Wash wounds with antibacterial soap and water. Anesthetic (Use 'Patient Medications' Section for Anesthetic Order Entry): Lidocaine applied to wound bed Edema Control - Lymphedema / Segmental Compressive Device / Other: Elevate, Exercise Daily and Avoid Standing for Long Periods of Time. Elevate legs to the level of the heart and pump ankles as often as possible Elevate leg(s) parallel to the floor when sitting. DO YOUR BEST to sleep in the bed at night. DO NOT sleep in your recliner. Long hours of sitting in a recliner leads to swelling of the legs and/or potential wounds on your backside. WOUND #1: - T Great Wound Laterality: Right, Lateral oe Cleanser: Byram Ancillary Kit -  15 Day Supply (Generic) 3 x Per Week/30 Days Discharge Instructions: Use supplies as instructed; Kit contains: (15) Saline Bullets; (15) 3x3 Gauze; 15 pr Gloves Cleanser: Soap and Water 3 x Per Week/30 Days Discharge Instructions: Gently cleanse wound with antibacterial soap, rinse and pat dry prior to dressing wounds Prim Dressing: Hydrofera Blue Ready Transfer Foam, 2.5x2.5 (in/in) 3 x Per Week/30 Days ary Discharge Instructions: Apply Hydrofera Blue Ready to wound bed as directed Secondary Dressing: Gauze 3 x Per Week/30 Days Discharge Instructions: As directed: dry, for padding Secondary Dressing: Kerlix 4.5 x 4.1 (in/yd) 3 x Per Week/30 Days Discharge Instructions: Apply Kerlix 4.5 x 4.1 (in/yd) as instructed Secured With: Medipore T - 32M Medipore H Soft Cloth Surgical T ape ape, 2x2 (in/yd) (Generic) 3 x Per Week/30 Days WOUND #2: - Foot Wound Laterality: Right, Lateral Cleanser: Soap and Water 3 x Per Week/30 Days Discharge Instructions: Gently cleanse wound with antibacterial soap, rinse and pat dry prior to dressing wounds Prim Dressing: Hydrofera Blue Ready Transfer Foam, 2.5x2.5 (in/in) 3 x Per Week/30 Days ary Discharge Instructions: Apply Hydrofera Blue Ready to wound bed as directed Secondary Dressing: Gauze 3 x Per Week/30 Days Discharge Instructions: As directed: dry Secured With: Medipore T - 32M Medipore H Soft Cloth Surgical T ape ape, 2x2 (in/yd) 3 x Per Week/30 Days Secured With: [employer] Sterile or Non-Sterile 6-ply 4.5x4 (yd/yd) 3 x Per Week/30 Days Discharge Instructions: Apply Kerlix as directed 1. I am going to suggest that we have the patient continue to utilize the wound care measures as before. This includes the use of the [facility] followed by the roll gauze and gauze to secure in place. 2. I am also going to recommend that we have the patient continue to monitor for any signs of infection right now I see nothing and we will keep a close eye on this  but at the same time I think she is really doing quite well. We will see patient back for reevaluation in 1 week here in the clinic. If anything worsens or changes patient will contact our office for additional recommendations. Electronic Signature(s) Signed: 04/21/2023 6:17:04 PM By: Allen Derry PA-C Entered By: Allen Derry on 04/21/2023 18:17:04 -------------------------------------------------------------------------------- SuperBill Details Patient  Name: Date of Service: ANNTONETTE, MADEWELL 04/21/2023 Medical Record Number: [id number] Patient Account Number: [id number] Date of Birth/Sex: Treating RN: 01-25-1951 (72 y.o. 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 Harrel, Jarrod ([id number]) [id number] Page 8 of 8 Z79.01 Long term (current) use of anticoagulants J44.9 Chronic obstructive pulmonary disease, unspecified I25.10 Atherosclerotic heart disease of native coronary artery without angina pectoris Facility Procedures : CPT4 Code: [id number] Description: 11042 - DEB SUBQ TISSUE 20 SQ CM/< ICD-10 Diagnosis Description L97.512 Non-pressure chronic ulcer of other part of right foot with fat layer exposed Modifier: Quantity: 1 Physician Procedures : CPT4 Code Description Modifier [id number] 11042 - WC PHYS SUBQ TISS 20 SQ CM ICD-10 Diagnosis Description L97.512 Non-pressure chronic ulcer of other part of right foot with fat layer exposed Quantity: 1 Electronic Signature(s) Signed: 04/21/2023 6:17:27 PM By: Allen Derry PA-C Entered By: Allen Derry on 04/21/2023 18:17:26

## 2023-04-25 ENCOUNTER — Encounter (HOSPITAL_COMMUNITY): Payer: Self-pay | Admitting: *Deleted

## 2023-04-25 ENCOUNTER — Emergency Department (HOSPITAL_COMMUNITY): Payer: Medicare Other

## 2023-04-25 ENCOUNTER — Observation Stay (HOSPITAL_COMMUNITY): Payer: Medicare Other

## 2023-04-25 ENCOUNTER — Inpatient Hospital Stay (HOSPITAL_COMMUNITY)
Admission: EM | Admit: 2023-04-25 | Discharge: 2023-05-07 | DRG: 871 | Disposition: A | Discharge status: Skilled Nursing Facility | Payer: Medicare Other | Attending: Internal Medicine | Admitting: Internal Medicine

## 2023-04-25 ENCOUNTER — Other Ambulatory Visit: Payer: Self-pay

## 2023-04-25 DIAGNOSIS — N3001 Acute cystitis with hematuria: Secondary | ICD-10-CM

## 2023-04-25 DIAGNOSIS — Z7409 Other reduced mobility: Secondary | ICD-10-CM | POA: Diagnosis present

## 2023-04-25 DIAGNOSIS — R7881 Bacteremia: Secondary | ICD-10-CM | POA: Insufficient documentation

## 2023-04-25 DIAGNOSIS — Z79899 Other long term (current) drug therapy: Secondary | ICD-10-CM

## 2023-04-25 DIAGNOSIS — Z87891 Personal history of nicotine dependence: Secondary | ICD-10-CM

## 2023-04-25 DIAGNOSIS — B9620 Unspecified Escherichia coli [E. coli] as the cause of diseases classified elsewhere: Secondary | ICD-10-CM | POA: Insufficient documentation

## 2023-04-25 DIAGNOSIS — Z885 Allergy status to narcotic agent status: Secondary | ICD-10-CM

## 2023-04-25 DIAGNOSIS — J42 Unspecified chronic bronchitis: Secondary | ICD-10-CM

## 2023-04-25 DIAGNOSIS — I739 Peripheral vascular disease, unspecified: Secondary | ICD-10-CM | POA: Diagnosis present

## 2023-04-25 DIAGNOSIS — Z751 Person awaiting admission to adequate facility elsewhere: Secondary | ICD-10-CM

## 2023-04-25 DIAGNOSIS — Z7982 Long term (current) use of aspirin: Secondary | ICD-10-CM

## 2023-04-25 DIAGNOSIS — R6520 Severe sepsis without septic shock: Secondary | ICD-10-CM | POA: Diagnosis present

## 2023-04-25 DIAGNOSIS — N390 Urinary tract infection, site not specified: Secondary | ICD-10-CM | POA: Diagnosis not present

## 2023-04-25 DIAGNOSIS — G9340 Encephalopathy, unspecified: Secondary | ICD-10-CM | POA: Insufficient documentation

## 2023-04-25 DIAGNOSIS — J440 Chronic obstructive pulmonary disease with acute lower respiratory infection: Secondary | ICD-10-CM | POA: Diagnosis present

## 2023-04-25 DIAGNOSIS — Z880 Allergy status to penicillin: Secondary | ICD-10-CM

## 2023-04-25 DIAGNOSIS — L97519 Non-pressure chronic ulcer of other part of right foot with unspecified severity: Secondary | ICD-10-CM | POA: Diagnosis present

## 2023-04-25 DIAGNOSIS — K521 Toxic gastroenteritis and colitis: Secondary | ICD-10-CM | POA: Diagnosis not present

## 2023-04-25 DIAGNOSIS — A499 Bacterial infection, unspecified: Secondary | ICD-10-CM

## 2023-04-25 DIAGNOSIS — N136 Pyonephrosis: Secondary | ICD-10-CM | POA: Diagnosis present

## 2023-04-25 DIAGNOSIS — J189 Pneumonia, unspecified organism: Secondary | ICD-10-CM

## 2023-04-25 DIAGNOSIS — F919 Conduct disorder, unspecified: Secondary | ICD-10-CM | POA: Diagnosis present

## 2023-04-25 DIAGNOSIS — N179 Acute kidney failure, unspecified: Secondary | ICD-10-CM | POA: Diagnosis not present

## 2023-04-25 DIAGNOSIS — N200 Calculus of kidney: Secondary | ICD-10-CM

## 2023-04-25 DIAGNOSIS — Z87442 Personal history of urinary calculi: Secondary | ICD-10-CM

## 2023-04-25 DIAGNOSIS — I10 Essential (primary) hypertension: Secondary | ICD-10-CM | POA: Diagnosis present

## 2023-04-25 DIAGNOSIS — A419 Sepsis, unspecified organism: Secondary | ICD-10-CM

## 2023-04-25 DIAGNOSIS — Z7902 Long term (current) use of antithrombotics/antiplatelets: Secondary | ICD-10-CM

## 2023-04-25 DIAGNOSIS — J4489 Other specified chronic obstructive pulmonary disease: Secondary | ICD-10-CM

## 2023-04-25 DIAGNOSIS — J690 Pneumonitis due to inhalation of food and vomit: Secondary | ICD-10-CM | POA: Diagnosis present

## 2023-04-25 DIAGNOSIS — I2510 Atherosclerotic heart disease of native coronary artery without angina pectoris: Secondary | ICD-10-CM | POA: Diagnosis present

## 2023-04-25 DIAGNOSIS — T3695XA Adverse effect of unspecified systemic antibiotic, initial encounter: Secondary | ICD-10-CM | POA: Diagnosis not present

## 2023-04-25 DIAGNOSIS — Z1612 Extended spectrum beta lactamase (ESBL) resistance: Secondary | ICD-10-CM | POA: Diagnosis present

## 2023-04-25 DIAGNOSIS — E876 Hypokalemia: Secondary | ICD-10-CM | POA: Diagnosis not present

## 2023-04-25 DIAGNOSIS — A4151 Sepsis due to Escherichia coli [E. coli]: Principal | ICD-10-CM | POA: Diagnosis present

## 2023-04-25 DIAGNOSIS — Z9012 Acquired absence of left breast and nipple: Secondary | ICD-10-CM

## 2023-04-25 DIAGNOSIS — Z87440 Personal history of urinary (tract) infections: Secondary | ICD-10-CM

## 2023-04-25 DIAGNOSIS — Z981 Arthrodesis status: Secondary | ICD-10-CM

## 2023-04-25 HISTORY — DX: Bacterial infection, unspecified: A49.9

## 2023-04-25 LAB — COMPREHENSIVE METABOLIC PANEL
ALT: 18 U/L (ref 0–44)
AST: 27 U/L (ref 15–41)
Albumin: 3.1 g/dL — ABNORMAL LOW (ref 3.5–5.0)
Alkaline Phosphatase: 81 U/L (ref 38–126)
Anion gap: 15 (ref 5–15)
BUN: 16 mg/dL (ref 8–23)
CO2: 23 mmol/L (ref 22–32)
Calcium: 8.9 mg/dL (ref 8.9–10.3)
Chloride: 97 mmol/L — ABNORMAL LOW (ref 98–111)
Creatinine, Ser: 1.17 mg/dL — ABNORMAL HIGH (ref 0.44–1.00)
GFR, Estimated: 50 mL/min — ABNORMAL LOW (ref >60)
Glucose, Bld: 158 mg/dL — ABNORMAL HIGH (ref 70–99)
Potassium: 3.9 mmol/L (ref 3.5–5.1)
Sodium: 135 mmol/L (ref 135–145)
Total Bilirubin: 0.5 mg/dL (ref 0.3–1.2)
Total Protein: 7.3 g/dL (ref 6.5–8.1)

## 2023-04-25 LAB — URINALYSIS, W/ REFLEX TO CULTURE (INFECTION SUSPECTED)
Bilirubin Urine: NEGATIVE
Glucose, UA: NEGATIVE mg/dL
Ketones, ur: NEGATIVE mg/dL
Nitrite: POSITIVE — AB
Protein, ur: 30 mg/dL — AB
RBC / HPF: >50 RBC/hpf (ref 0–5)
Specific Gravity, Urine: 1.012 (ref 1.005–1.030)
WBC, UA: >50 WBC/hpf (ref 0–5)
pH: 5 (ref 5.0–8.0)

## 2023-04-25 LAB — I-STAT VENOUS BLOOD GAS, ED
Acid-Base Excess: 0 mmol/L (ref 0.0–2.0)
Bicarbonate: 25.1 mmol/L (ref 20.0–28.0)
Calcium, Ion: 1.13 mmol/L — ABNORMAL LOW (ref 1.15–1.40)
HCT: 39 % (ref 36.0–46.0)
Hemoglobin: 13.3 g/dL (ref 12.0–15.0)
O2 Saturation: 99 %
Potassium: 3.3 mmol/L — ABNORMAL LOW (ref 3.5–5.1)
Sodium: 136 mmol/L (ref 135–145)
TCO2: 26 mmol/L (ref 22–32)
pCO2, Ven: 40.2 mmHg — ABNORMAL LOW (ref 44–60)
pH, Ven: 7.404 (ref 7.25–7.43)
pO2, Ven: 138 mmHg — ABNORMAL HIGH (ref 32–45)

## 2023-04-25 LAB — CBC WITH DIFFERENTIAL/PLATELET
Abs Immature Granulocytes: 0.01 10*3/uL (ref 0.00–0.07)
Basophils Absolute: 0 10*3/uL (ref 0.0–0.1)
Basophils Relative: 1 %
Eosinophils Absolute: 0.1 10*3/uL (ref 0.0–0.5)
Eosinophils Relative: 2 %
HCT: 39.9 % (ref 36.0–46.0)
Hemoglobin: 13.1 g/dL (ref 12.0–15.0)
Immature Granulocytes: 0 %
Lymphocytes Relative: 5 %
Lymphs Abs: 0.2 10*3/uL — ABNORMAL LOW (ref 0.7–4.0)
MCH: 26.7 pg (ref 26.0–34.0)
MCHC: 32.8 g/dL (ref 30.0–36.0)
MCV: 81.4 fL (ref 80.0–100.0)
Monocytes Absolute: 0.1 10*3/uL (ref 0.1–1.0)
Monocytes Relative: 1 %
Neutro Abs: 3.7 10*3/uL (ref 1.7–7.7)
Neutrophils Relative %: 91 %
Platelets: 318 10*3/uL (ref 150–400)
RBC: 4.9 MIL/uL (ref 3.87–5.11)
RDW: 13.4 % (ref 11.5–15.5)
WBC: 4.1 10*3/uL (ref 4.0–10.5)
nRBC: 0 % (ref 0.0–0.2)

## 2023-04-25 LAB — PROTIME-INR
INR: 1.2 (ref 0.8–1.2)
Prothrombin Time: 15.2 seconds (ref 11.4–15.2)

## 2023-04-25 LAB — LACTIC ACID, PLASMA
Lactic Acid, Venous: 2.1 mmol/L (ref 0.5–1.9)
Lactic Acid, Venous: 1.3 mmol/L (ref 0.5–1.9)

## 2023-04-25 LAB — BLOOD GAS, VENOUS
Acid-base deficit: 0.3 mmol/L (ref 0.0–2.0)
Bicarbonate: 24 mmol/L (ref 20.0–28.0)
O2 Saturation: 67.6 %
Patient temperature: 36.8
pCO2, Ven: 37 mmHg — ABNORMAL LOW (ref 44–60)
pH, Ven: 7.42 (ref 7.25–7.43)
pO2, Ven: 35 mmHg (ref 32–45)

## 2023-04-25 LAB — D-DIMER, QUANTITATIVE: D-Dimer, Quant: 2.64 ug/mL-FEU — ABNORMAL HIGH (ref 0.00–0.50)

## 2023-04-25 LAB — CBG MONITORING, ED: Glucose-Capillary: 147 mg/dL — ABNORMAL HIGH (ref 70–99)

## 2023-04-25 LAB — BRAIN NATRIURETIC PEPTIDE: B Natriuretic Peptide: 472.8 pg/mL — ABNORMAL HIGH (ref 0.0–100.0)

## 2023-04-25 LAB — APTT: aPTT: 29 seconds (ref 24–36)

## 2023-04-25 LAB — MRSA NEXT GEN BY PCR, NASAL: MRSA by PCR Next Gen: NOT DETECTED

## 2023-04-25 LAB — STREP PNEUMONIAE URINARY ANTIGEN: Strep Pneumo Urinary Antigen: NEGATIVE

## 2023-04-25 MED ORDER — ONDANSETRON HCL 4 MG PO TABS: 4.0000 mg | ORAL_TABLET | Freq: Four times a day (QID) | ORAL | Status: DC | PRN

## 2023-04-25 MED ORDER — SODIUM CHLORIDE 0.9 % IV SOLN: 2.0000 g | Freq: Once | INTRAVENOUS | Status: DC

## 2023-04-25 MED ORDER — ENOXAPARIN SODIUM 40 MG/0.4ML IJ SOSY
40.0000 mg | PREFILLED_SYRINGE | INTRAMUSCULAR | Status: DC
  Administered 2023-04-26 – 2023-05-07 (×2): 40 mg via SUBCUTANEOUS
  Filled 2023-04-25 (×9): qty 0.4

## 2023-04-25 MED ORDER — MELATONIN 5 MG PO TABS
10.0000 mg | ORAL_TABLET | Freq: Every day | ORAL | Status: DC
  Administered 2023-04-25 – 2023-05-06 (×12): 10 mg via ORAL
  Filled 2023-04-25 (×12): qty 2

## 2023-04-25 MED ORDER — VANCOMYCIN HCL 1500 MG/300ML IV SOLN
1500.0000 mg | Freq: Once | INTRAVENOUS | Status: AC
  Administered 2023-04-25: 1500 mg via INTRAVENOUS
  Filled 2023-04-25: qty 300

## 2023-04-25 MED ORDER — LACTATED RINGERS IV SOLN: INTRAVENOUS | Status: AC

## 2023-04-25 MED ORDER — ACETAMINOPHEN 500 MG PO TABS
1000.0000 mg | ORAL_TABLET | Freq: Every day | ORAL | Status: DC
  Administered 2023-04-25: 1000 mg via ORAL
  Filled 2023-04-25: qty 2

## 2023-04-25 MED ORDER — CLOPIDOGREL BISULFATE 75 MG PO TABS
75.0000 mg | ORAL_TABLET | Freq: Every day | ORAL | Status: DC
  Administered 2023-04-26 – 2023-05-07 (×12): 75 mg via ORAL
  Filled 2023-04-25 (×12): qty 1

## 2023-04-25 MED ORDER — SODIUM CHLORIDE 0.9 % IV SOLN
2.0000 g | Freq: Once | INTRAVENOUS | Status: AC
  Administered 2023-04-25: 2 g via INTRAVENOUS
  Filled 2023-04-25: qty 12.5

## 2023-04-25 MED ORDER — LACTATED RINGERS IV BOLUS (SEPSIS)
1000.0000 mL | Freq: Once | INTRAVENOUS | Status: AC
  Administered 2023-04-25: 1000 mL via INTRAVENOUS

## 2023-04-25 MED ORDER — ASPIRIN 81 MG PO TBEC
81.0000 mg | DELAYED_RELEASE_TABLET | Freq: Every day | ORAL | Status: DC
  Administered 2023-04-26 – 2023-05-07 (×12): 81 mg via ORAL
  Filled 2023-04-25 (×12): qty 1

## 2023-04-25 MED ORDER — VANCOMYCIN HCL 1250 MG/250ML IV SOLN: 1250.0000 mg | INTRAVENOUS | Status: DC

## 2023-04-25 MED ORDER — ALBUTEROL SULFATE (2.5 MG/3ML) 0.083% IN NEBU
2.5000 mg | INHALATION_SOLUTION | Freq: Four times a day (QID) | RESPIRATORY_TRACT | Status: DC | PRN
  Administered 2023-04-27 – 2023-05-01 (×5): 2.5 mg via RESPIRATORY_TRACT
  Filled 2023-04-25 (×5): qty 3

## 2023-04-25 MED ORDER — IPRATROPIUM-ALBUTEROL 0.5-2.5 (3) MG/3ML IN SOLN
3.0000 mL | Freq: Four times a day (QID) | RESPIRATORY_TRACT | Status: DC
  Administered 2023-04-26: 3 mL via RESPIRATORY_TRACT
  Filled 2023-04-25 (×3): qty 3

## 2023-04-25 MED ORDER — IOHEXOL 350 MG/ML SOLN
75.0000 mL | Freq: Once | INTRAVENOUS | Status: AC | PRN
  Administered 2023-04-25: 75 mL via INTRAVENOUS

## 2023-04-25 MED ORDER — ONDANSETRON HCL 4 MG/2ML IJ SOLN: 4.0000 mg | Freq: Four times a day (QID) | INTRAMUSCULAR | Status: DC | PRN

## 2023-04-25 MED ORDER — AMLODIPINE BESYLATE 10 MG PO TABS
10.0000 mg | ORAL_TABLET | Freq: Every day | ORAL | Status: DC
  Administered 2023-04-25 – 2023-04-26 (×2): 10 mg via ORAL
  Filled 2023-04-25: qty 1
  Filled 2023-04-25: qty 2
  Filled 2023-04-25: qty 1

## 2023-04-25 MED ORDER — ACETAMINOPHEN 325 MG PO TABS: 325.0000 mg | ORAL_TABLET | Freq: Once | ORAL | Status: DC

## 2023-04-25 MED ORDER — CARVEDILOL 12.5 MG PO TABS
12.5000 mg | ORAL_TABLET | Freq: Two times a day (BID) | ORAL | Status: DC
  Administered 2023-04-25 – 2023-04-26 (×3): 12.5 mg via ORAL
  Filled 2023-04-25 (×4): qty 1

## 2023-04-25 MED ORDER — SODIUM CHLORIDE 0.9 % IV SOLN: INTRAVENOUS | Status: DC

## 2023-04-25 MED ORDER — ACETAMINOPHEN 500 MG PO TABS: 1000.0000 mg | ORAL_TABLET | Freq: Every day | ORAL | Status: DC

## 2023-04-25 MED ORDER — METRONIDAZOLE 500 MG/100ML IV SOLN
500.0000 mg | Freq: Once | INTRAVENOUS | Status: AC
  Administered 2023-04-25: 500 mg via INTRAVENOUS
  Filled 2023-04-25: qty 100

## 2023-04-25 MED ORDER — GUAIFENESIN ER 600 MG PO TB12
1200.0000 mg | ORAL_TABLET | Freq: Two times a day (BID) | ORAL | Status: DC
  Administered 2023-04-25 – 2023-04-30 (×11): 1200 mg via ORAL
  Filled 2023-04-25 (×11): qty 2

## 2023-04-25 MED ORDER — VANCOMYCIN HCL IN DEXTROSE 1-5 GM/200ML-% IV SOLN: 1000.0000 mg | Freq: Once | INTRAVENOUS | Status: DC

## 2023-04-25 MED ORDER — SODIUM CHLORIDE 0.9 % IV SOLN: 2.0000 g | Freq: Two times a day (BID) | INTRAVENOUS | Status: DC

## 2023-04-25 MED ORDER — MELATONIN 5 MG PO TABS: 10.0000 mg | ORAL_TABLET | Freq: Every day | ORAL | Status: DC

## 2023-04-25 MED ORDER — MOMETASONE FURO-FORMOTEROL FUM 200-5 MCG/ACT IN AERO
2.0000 | INHALATION_SPRAY | Freq: Two times a day (BID) | RESPIRATORY_TRACT | Status: DC
  Administered 2023-04-26 – 2023-04-30 (×9): 2 via RESPIRATORY_TRACT
  Filled 2023-04-25 (×2): qty 8.8

## 2023-04-25 MED ORDER — ACETAMINOPHEN 325 MG PO TABS
650.0000 mg | ORAL_TABLET | Freq: Once | ORAL | Status: AC
  Administered 2023-04-25: 650 mg via ORAL
  Filled 2023-04-25: qty 2

## 2023-04-25 MED ORDER — LORATADINE 10 MG PO TABS
10.0000 mg | ORAL_TABLET | Freq: Every day | ORAL | Status: DC
  Administered 2023-04-26 – 2023-05-07 (×12): 10 mg via ORAL
  Filled 2023-04-25 (×12): qty 1

## 2023-04-25 MED ORDER — AZITHROMYCIN 500 MG PO TABS
500.0000 mg | ORAL_TABLET | Freq: Every day | ORAL | Status: DC
  Administered 2023-04-25: 500 mg via ORAL
  Filled 2023-04-25: qty 2

## 2023-04-25 MED ORDER — SODIUM CHLORIDE 0.9 % IV SOLN
1.0000 g | INTRAVENOUS | Status: DC
  Filled 2023-04-25: qty 10

## 2023-04-25 NOTE — Progress Notes (Signed)
Pt refused resp txs @ this time. Educated pt on compliance, Dulera and use of aerochamber w/Albuterol MDI when SOB or coughing. Pt concerned about cost of steroid based inhalers so hesitant to start and would call if she needed an Albuterol tx.

## 2023-04-25 NOTE — ED Provider Notes (Signed)
[facility] AT [facility] Provider Note   CSN: [id number] Arrival date & time: 04/25/23  4098     History  Chief Complaint  Patient presents with   Respiratory Distress    [name] is a 72 y.o. female.  HPI 72 year old female presents today via EMS due to shortness of breath.  EMS states that patient was in moderate distress due to her shortness of breath and they gave albuterol, magnesium, and Solu-Medrol prehospital.  They reported that patient was breathing much better.  Daughter is at bedside on my evaluation.  She states that the patient had a "stomach bug" 2 weeks ago.  She was sick with that for several days but appears to be getting better for several days.  She normally goes to wound care for wounds on her right foot but missed this week due to not feeling well.  Her granddaughter came by her room this morning and noted that she was dyspneic and patient complained of being short of breath.  She has a history of chronic bronchitis and uses inhalers daily.  Daughter states that she is now much less responsive and conversant than usual.  She was a smoker for many years and has a history of peripheral vascular disease and chronic wound on her right foot.  She takes Plavix daily.  No report of head injury or trauma  Home Medications Prior to Admission medications   Medication Sig Start Date End Date Taking? Authorizing Provider  acetaminophen (TYLENOL) 500 MG tablet Take 1,000 mg by mouth at bedtime.    [provider]  albuterol (PROVENTIL HFA;VENTOLIN HFA) 108 (90 Base) MCG/ACT inhaler Inhale 2 puffs into the lungs every 6 (six) hours as needed for wheezing or shortness of breath.    [provider]  amLODipine (NORVASC) 10 MG tablet Take 10 mg by mouth daily.    [provider]  aspirin EC 81 MG tablet Take 81 mg by mouth daily. Swallow whole.    [provider]  carvedilol (COREG) 12.5 MG tablet Take 12.5 mg by  mouth 2 (two) times daily with a meal.    [provider]  cetirizine (ZYRTEC) 10 MG chewable tablet Chew 10 mg by mouth daily.    [provider]  clopidogrel (PLAVIX) 75 MG tablet Take 1 tablet (75 mg total) by mouth daily with breakfast. 02/09/23   Georgiana Spinner, NP  doxycycline (VIBRA-TABS) 100 MG tablet TAKE ONE TABLET BY MOUTH TWICE A DAY 02/12/23   Vivi Barrack, DPM  Melatonin 10 MG TABS Take by mouth at bedtime.    [provider]  Multiple Vitamin (MULTIVITAMIN WITH MINERALS) TABS tablet Take 1 tablet by mouth daily.    [provider]  mupirocin ointment (BACTROBAN) 2 % Apply 1 Application topically 2 (two) times daily. Patient not taking: Reported on 02/09/2023 01/19/23   Georgiana Spinner, NP      Allergies    Penicillins, Oxycodone, and Vicodin [hydrocodone-acetaminophen]    Review of Systems   Review of Systems  Physical Exam Updated Vital Signs BP 119/67   Pulse 80   Temp 98.9 F (37.2 C) (Oral)   Resp 19   Ht 1.6 m (5\' 3" )   Wt 72.6 kg   SpO2 91%   BMI 28.34 kg/m  Physical Exam Vitals and nursing note reviewed.  Constitutional:      General: She is not in acute distress.    Appearance: Normal appearance. She is ill-appearing.  HENT:     Head: Normocephalic.     Right Ear: External ear normal.     Left Ear: External ear normal.     Nose: Nose normal.     Mouth/Throat:     Pharynx: Oropharynx is clear.  Eyes:     Extraocular Movements: Extraocular movements intact.     Pupils: Pupils are equal, round, and reactive to light.  Cardiovascular:     Rate and Rhythm: Normal rate and regular rhythm.     Pulses: Normal pulses.  Pulmonary:     Effort: Pulmonary effort is normal.     Breath sounds: Normal breath sounds.  Abdominal:     General: Bowel sounds are normal. There is no distension.     Palpations: Abdomen is soft.     Tenderness: There is no abdominal tenderness.  Musculoskeletal:        General: Normal range  of motion.     Cervical back: Normal range of motion.     Comments: Right foot examined with wounds noted on medial aspect of right great toe and lateral aspect of foot below the small toe There is no surrounding erythema Bilateral lower extremities there are no other signs of trauma or injury Pulses are intact  Neurological:     Mental Status: She is alert.     Comments: Patient is awake and appears alert and attempts to follow directions but is not speaking She holds her hands up and does not have bilateral is palmar drift She is not attempting to move her lower extremities for me but can move her toes     ED Results / Procedures / Treatments   Labs (all labs ordered are listed, but only abnormal results are displayed) Labs Reviewed  CBC WITH DIFFERENTIAL/PLATELET - Abnormal; Notable for the following components:      Result Value   Lymphs Abs 0.2 (*)    All other components within normal limits  COMPREHENSIVE METABOLIC PANEL - Abnormal; Notable for the following components:   Chloride 97 (*)    Glucose, Bld 158 (*)    Creatinine, Ser 1.17 (*)    Albumin 3.1 (*)    GFR, Estimated 50 (*)    All other components within normal limits  D-DIMER, QUANTITATIVE - Abnormal; Notable for the following components:   D-Dimer, Quant 2.64 (*)    All other components within normal limits  LACTIC ACID, PLASMA - Abnormal; Notable for the following components:   Lactic Acid, Venous 2.1 (*)    All other components within normal limits  URINALYSIS, W/ REFLEX TO CULTURE (INFECTION SUSPECTED) - Abnormal; Notable for the following components:   APPearance CLOUDY (*)    Hgb urine dipstick LARGE (*)    Protein, ur 30 (*)    Nitrite POSITIVE (*)    Leukocytes,Ua MODERATE (*)    Bacteria, UA MANY (*)    All other components within normal limits  CBG MONITORING, ED - Abnormal; Notable for the following components:   Glucose-Capillary 147 (*)    All other components within normal limits  I-STAT  VENOUS BLOOD GAS, ED - Abnormal; Notable for the following components:   pCO2, Ven 40.2 (*)    pO2, Ven 138 (*)    Potassium 3.3 (*)    Calcium, Ion 1.13 (*)    All other components within normal limits  CULTURE, BLOOD (ROUTINE X 2)  CULTURE, BLOOD (ROUTINE X 2)  MRSA NEXT GEN BY PCR, NASAL  URINE CULTURE  PROTIME-INR  APTT  BLOOD GAS, VENOUS  BRAIN NATRIURETIC PEPTIDE  LACTIC ACID, PLASMA    EKG None  Radiology CT Angio Chest PE W and/or Wo Contrast  Result Date: 04/25/2023 CLINICAL DATA:  Pulmonary embolism (PE) suspected, high prob EXAM: CT ANGIOGRAPHY CHEST WITH CONTRAST TECHNIQUE: Multidetector CT imaging of the chest was performed using the standard protocol during bolus administration of intravenous contrast. Multiplanar CT image reconstructions and MIPs were obtained to evaluate the vascular anatomy. RADIATION DOSE REDUCTION: This exam was performed according to the departmental dose-optimization program which includes automated exposure control, adjustment of the mA and/or kV according to patient size and/or use of iterative reconstruction technique. CONTRAST:  75mL OMNIPAQUE IOHEXOL 350 MG/ML SOLN COMPARISON:  Same day chest x-Kienna Moncada. FINDINGS: Cardiovascular: Satisfactory opacification of the pulmonary arteries to the segmental level. No evidence of pulmonary embolism. Trace pericardial fluid, likely physiologic. Heart is mildly enlarged. Atherosclerotic calcifications of the nonaneurysmal thoracic aorta. Three-vessel coronary artery atherosclerotic calcifications. Mediastinum/Nodes: No axillary or mediastinal adenopathy. Visualized thyroid is unremarkable. Lungs/Pleura: No pneumothorax. Trace RIGHT pleural effusion. Mild centrilobular emphysema. Multifocal debris in the trachea with bibasilar endobronchial debris, RIGHT greater than LEFT. There is a small amount of downstream centrilobular and consolidative opacities in the RIGHT lower lobe at the lung base. Mildly expansile mucous  plugging in the superior aspect of the LEFT lower lobe (series 7, image 153). Upper Abdomen: Hepatic steatosis.  Tiny hiatal hernia. Musculoskeletal: Status post LEFT breast and axillary surgery. No acute osseous abnormality. Review of the MIP images confirms the above findings. IMPRESSION: 1. No evidence of pulmonary embolism. 2. Multifocal debris in the trachea with bibasilar endobronchial debris, RIGHT greater than LEFT. There is a small amount of downstream centrilobular and consolidative opacities in the RIGHT lower lobe at the lung base. Findings could reflect aspiration or infection. Aortic Atherosclerosis (ICD10-I70.0) and Emphysema (ICD10-J43.9). Electronically Signed   By: Meda Klinefelter M.D.   On: 04/25/2023 14:42   CT Head Wo Contrast  Result Date: 04/25/2023 CLINICAL DATA:  Mental status change, unknown cause. EXAM: CT HEAD WITHOUT CONTRAST TECHNIQUE: Contiguous axial images were obtained from the base of the skull through the vertex without intravenous contrast. RADIATION DOSE REDUCTION: This exam was performed according to the departmental dose-optimization program which includes automated exposure control, adjustment of the mA and/or kV according to patient size and/or use of iterative reconstruction technique. COMPARISON:  None Available. FINDINGS: Brain: No acute intracranial hemorrhage. 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 Patchy hypoattenuation of the periventricular white matter, most consistent with a mild chronic small-vessel disease. No hydrocephalus or extra-axial collection. No mass effect or midline shift. Vascular: No hyperdense vessel or unexpected calcification. Skull: No calvarial fracture or suspicious bone lesion. Skull base is unremarkable. Sinuses/Orbits: Chronic right sphenoid sinusitis. Other: None. IMPRESSION: 1. No acute intracranial hemorrhage. 2. Age-indeterminate, but likely chronic lacunar infarcts in the bilateral  basal ganglia. Electronically Signed   By: Orvan Falconer M.D.   On: 04/25/2023 14:35   DG Chest Port 1 View  Result Date: 04/25/2023 CLINICAL DATA:  Cough, dyspnea. EXAM: PORTABLE CHEST 1 VIEW COMPARISON:  None Available. FINDINGS: Heart size is upper limits of normal. Surgical clips in left axilla. Mildly prominent interstitial lung markings. No overt pulmonary edema. Atherosclerotic calcifications at the aortic arch. Negative for a pneumothorax. No acute bone abnormality. Mild irregularity involving bilateral ribs probably represent old fractures. IMPRESSION: Mildly prominent interstitial lung markings. Findings are nonspecific but could be associated with chronic changes. Difficult  to exclude mild interstitial edema. Electronically Signed   By: Richarda Overlie M.D.   On: 04/25/2023 10:21    Procedures .Critical Care  Performed by: Margarita Grizzle, MD Authorized by: Margarita Grizzle, MD   Critical care provider statement:    Critical care time (minutes):  75   Critical care was time spent personally by me on the following activities:  Development of treatment plan with patient or surrogate, discussions with consultants, evaluation of patient's response to treatment, examination of patient, ordering and review of laboratory studies, ordering and review of radiographic studies, ordering and performing treatments and interventions, pulse oximetry, re-evaluation of patient's condition and review of old charts     Medications Ordered in ED Medications  lactated ringers infusion ( Intravenous New Bag/Given 04/25/23 1341)  vancomycin (VANCOREADY) IVPB 1500 mg/300 mL (1,500 mg Intravenous New Bag/Given 04/25/23 1344)  ceFEPIme (MAXIPIME) 2 g in sodium chloride 0.9 % 100 mL IVPB (has no administration in time range)  vancomycin (VANCOREADY) IVPB 1250 mg/250 mL (has no administration in time range)  lactated ringers bolus 1,000 mL (0 mLs Intravenous Stopped 04/25/23 1303)  metroNIDAZOLE (FLAGYL) IVPB 500 mg (0 mg  Intravenous Stopped 04/25/23 1303)  ceFEPIme (MAXIPIME) 2 g in sodium chloride 0.9 % 100 mL IVPB (0 g Intravenous Stopped 04/25/23 1240)  acetaminophen (TYLENOL) tablet 650 mg (650 mg Oral Given 04/25/23 1149)  iohexol (OMNIPAQUE) 350 MG/ML injection 75 mL (75 mLs Intravenous Contrast Given 04/25/23 1424)    ED Course/ Medical Decision Making/ A&P Clinical Course as of 04/25/23 1531  Sat Apr 25, 2023  1434 's urinalysis reviewed interpreted significant for greater than 50 white blood cells and greater than 50 red blood cells with many bacteria consistent with urinary tract infection [DR]  1434 D-dimer reviewed interpreted elevated at 2.64 [DR]  1434 Complete metabolic panel is reviewed interpreted and is significant for creatinine mildly elevated 1.17 increased from first prior of 0.83 months ago [DR]    Clinical Course User Index [DR] Margarita Grizzle, MD                             Medical Decision Making Amount and/or Complexity of Data Reviewed Labs: ordered. Radiology: ordered. ECG/medicine tests: ordered.  Risk OTC drugs. Prescription drug management.   72 year old female presents today with dyspnea and altered mental status Differential diagnosis includes but is not limited to acute intracranial abnormality including hemorrhage or mass, stroke, sepsis other etiologies of acute infection After my initial evaluation, rectal temperature was obtained at 102.7. At that time, sepsis workup initiated.  Patient received 1 L of LR. Lactic acid elevated at 2.1 and second liter being given. Patient has remained hemodynamically stable with heart rate 80, blood pressure 119/67 and temp decreased to 98.9 after fluids and antipyretics. Labs and imaging obtained.  Likely etiology is urinary tract infection Chest x-[place] without focal infiltrate D-dimer was elevated and CT angio of the chest is pending at this time-CTA is negative for PE but is concerning for infiltrates right greater than  left. Patient maintained on monitor and remains in normal sinus rhythm.  She has remained hemodynamically stable. Care discussed with Dr. Chipper Herb who has assumed patient care and is seeing her for admission at this time        Final Clinical Impression(s) / ED Diagnoses Final diagnoses:  Urinary tract infection with hematuria, site unspecified  Pneumonia of both lungs due to infectious organism, unspecified part of lung  Sepsis without acute organ dysfunction, due to unspecified organism [facility])    Rx / DC Orders ED Discharge Orders     None         Margarita Grizzle, MD 04/25/23 1531

## 2023-04-25 NOTE — Progress Notes (Signed)
Elink following for sepsis protocol. 

## 2023-04-25 NOTE — Progress Notes (Signed)
Pharmacy Antibiotic Note  [name] is a 72 y.o. female admitted on 04/25/2023 presenting with SOB, concern for sepsis.  Pharmacy has been consulted for vancomycin and aztreonam dosing.  Pcn allergy noted as rash, pt has tolerated several cephalosporins in past, will proceed with cefepime  Plan: Vancomycin 1500 mg IV x 1, then 1250 mg IV q 36h (eAUC 465) Add MRSA PCR Cefepime 2g IV every 12 hours Monitor renal function, Cx/PCR to narrow Vancomycin levels as indicated  Height: 5\' 3"  (160 cm) Weight: 72.6 kg (160 lb) IBW/kg (Calculated) : 52.4  Temp (24hrs), Avg:98.9 F (37.2 C), Min:98.9 F (37.2 C), Max:98.9 F (37.2 C)  Recent Labs  Lab 04/25/23 0950  WBC 4.1  CREATININE 1.17*    Estimated Creatinine Clearance: 41.5 mL/min (A) (by C-G formula based on SCr of 1.17 mg/dL (H)).    Allergies  Allergen Reactions   Penicillins Rash   Oxycodone    Vicodin [Hydrocodone-Acetaminophen]     Daylene Posey, PharmD, [facility] Clinical Pharmacist ED Pharmacist Phone # [phone] 04/25/2023 11:43 AM

## 2023-04-25 NOTE — Consult Note (Signed)
WOC Nurse Consult Note: Reason for Consult:wound care to chronic, healing wounds on right foot,. Followed by the outpatient [facility] in the community. Last seen on 04/21/23 by Provider Job Founds III. Wound type:PAD Pressure Injury POA: N/A Measurement:Per WCC visit on 4/30.24: Right great toe:  2cm x 1cm x 0.2cm  Right lateral foot: 0.7cm x 0.6cm x 0.2cm Wound ZOX:WRUE, dry Drainage (amount, consistency, odor) scant serous Periwound: with evidence of previous wound healing (scarring) Dressing procedure/placement/frequency: Hydrofera Blue ready foam is being used at the wound care center (antimicrobial), but is not available in house as it is a non formulary item. I will substitute a comparable antimicrobial dressing (Aquacel Ag+ Advantage, [place] # [id number]) for daily care. PIP prevention measures such as a silicone foam to the sacrum and bilateral Prevalon boots are ordered.  WOC nursing team will not follow, but will remain available to this patient, the nursing and medical teams.  Please re-consult if needed.  Thank you for inviting [place] to participate in this patient's Plan of Care.  Ladona Mow, MSN, RN, CNS, GNP, Leda Min, [employer], [employer] phone:  [phone]

## 2023-04-25 NOTE — ED Triage Notes (Addendum)
Patient presents to ed [facility] ems states patient was c/o sob was inmoderate distress upon ems arrival was in tri-pod position. Patient was given Albuterol 5 mg and atrovent  duo neb. Mag 2 g and soul -medrol 125 mg IV . Ems states patient was breathing much better. States patient was alert oriented, upon arrival patient could not tell me where she was or what day it was, ask her how long she had been sick stated I dont know. Ems states cough is more productive after breathing treatment. Per daughter patient was sick 2 weeks ago with stomach bug appearted betteer, x2 days stomach started getting upset again. Goes to wound care for right foot. Weaker than normal 2 days ago.

## 2023-04-25 NOTE — H&P (Signed)
History and Physical    [name] ZOX:[id number] DOB: 10/12/1951 DOA: 04/25/2023  PCP: Patient, No Pcp Per (Confirm with patient/family/NH records and if not entered, this has to be entered at [facility] point of entry) Patient coming from: Home  I have personally briefly reviewed patient's old medical records in [facility]  Chief Complaint: Feeling better  HPI: [name] is a 72 y.o. female with medical history significant of poorly controlled COPD, PVD with chronic right big toe ulcer, recurrent UTIs, kidney stones, HTN, brought in by family member for evaluation of worsening of cough shortness of breath.  Symptoms started about 1 week ago initially was considered to be a stomach virus with frequent loose bowel movement and nauseous vomiting for 1 to 2 days then patient developed worsening of dry cough.  According to the family, patient has poorly controlled COPD, she only uses albuterol but no maintenance inhalers or nebulizers.  And she has a chronic cough for the last 3 to 4 days spent having increasing cough she denies any urinary symptoms denies any dysuria or urinary frequency or back pain.  No fever or chills at home. ED Course: Patient was found to be febrile 102 F, borderline tachycardia nonhypoxic increasing breathing rate O2 saturation 91% on room air.  WBC 4.1, creatinine 1.1 K3.9 glucose 158 VBG 7 point 4/40/138.  Lactic acid 2.1  Code sepsis triggered, patient was started on broad-spectrum antibiotics vancomycin and cefepime  Review of Systems: As per HPI otherwise 14 point review of systems negative.    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   CAD (coronary artery disease)    COPD (chronic obstructive pulmonary disease) (HCC)    Frequent UTI    Kidney stones     Past Surgical History:  Procedure Laterality Date   ANKLE FUSION Right 2017   fibular graft  Bilateral    bilater fibular missing and  replaced in hips   HIP FRACTURE SURGERY Bilateral    LOWER EXTREMITY ANGIOGRAPHY Right 01/09/2023   Procedure: Lower Extremity Angiography;  Surgeon: Renford Dills, MD;  Location: [facility] INVASIVE CV LAB;  Service: Cardiovascular;  Laterality: Right;   MASTECTOMY, PARTIAL Left 2003   ROTATOR CUFF REPAIR Left    X 2   TONSILLECTOMY       reports that she has quit smoking. Her smoking use included cigarettes. She smoked an average of 1 pack per day. She has never used smokeless tobacco. She reports that she does not drink alcohol and does not use drugs.  Allergies  Allergen Reactions   Penicillins Rash   Oxycodone    Vicodin [Hydrocodone-Acetaminophen]     Family History  Adopted: Yes     Prior to Admission medications   Medication Sig Start Date End Date Taking? Authorizing Provider  acetaminophen (TYLENOL) 500 MG tablet Take 1,000 mg by mouth at bedtime.    [provider]  albuterol (PROVENTIL HFA;VENTOLIN HFA) 108 (90 Base) MCG/ACT inhaler Inhale 2 puffs into the lungs every 6 (six) hours as needed for wheezing or shortness of breath.    [provider]  amLODipine (NORVASC) 10 MG tablet Take 10 mg by mouth daily.    [provider]  aspirin EC 81 MG tablet Take 81 mg by mouth daily. Swallow whole.    [provider]  carvedilol (COREG) 12.5 MG tablet Take 12.5 mg by mouth 2 (two) times  daily with a meal.    [provider]  cetirizine (ZYRTEC) 10 MG chewable tablet Chew 10 mg by mouth daily.    [provider]  clopidogrel (PLAVIX) 75 MG tablet Take 1 tablet (75 mg total) by mouth daily with breakfast. 02/09/23   Georgiana Spinner, NP  doxycycline (VIBRA-TABS) 100 MG tablet TAKE ONE TABLET BY MOUTH TWICE A DAY 02/12/23   Vivi Barrack, DPM  Melatonin 10 MG TABS Take by mouth at bedtime.    [provider]  Multiple Vitamin (MULTIVITAMIN WITH MINERALS) TABS tablet Take 1 tablet by mouth daily.    [provider]  mupirocin ointment (BACTROBAN) 2 % Apply 1 Application topically 2 (two) times daily. Patient not taking: Reported on 02/09/2023 01/19/23   Georgiana Spinner, NP    Physical Exam: Vitals:   04/25/23 1045 04/25/23 1145 04/25/23 1315 04/25/23 1347  BP: (!) 135/92 103/60 129/82 119/67  Pulse: 92 83 82 80  Resp: (!) 24 (!) 23 19 19   Temp:      TempSrc:      SpO2: 96% 93% 94% 91%  Weight:      Height:        Constitutional: NAD, calm, comfortable Vitals:   04/25/23 1045 04/25/23 1145 04/25/23 1315 04/25/23 1347  BP: (!) 135/92 103/60 129/82 119/67  Pulse: 92 83 82 80  Resp: (!) 24 (!) 23 19 19   Temp:      TempSrc:      SpO2: 96% 93% 94% 91%  Weight:      Height:       Eyes: PERRL, lids and conjunctivae normal ENMT: Mucous membranes are dry. Posterior pharynx clear of any exudate or lesions.Normal dentition.  Neck: normal, supple, no masses, no thyromegaly Respiratory: clear to auscultation bilaterally, no wheezing, coarse crackles on bilateral lower fields, increasing. respiratory effort. No accessory muscle use.  Cardiovascular: Regular rate and rhythm, no murmurs / rubs / gallops. No extremity edema. 2+ pedal pulses. No carotid bruits.  Abdomen: no tenderness, no masses palpated. No hepatosplenomegaly. Bowel sounds positive.  Musculoskeletal: no clubbing / cyanosis. No joint deformity upper and lower extremities. Good ROM, no contractures. Normal muscle tone.  Skin: no rashes, lesions, ulcers. No induration Neurologic: CN 2-12 grossly intact. Sensation intact, DTR normal. Strength 5/5 in all 4.  Psychiatric: Normal judgment and insight. Alert and oriented x 3. Normal mood.     Labs on Admission: I have personally reviewed following labs and imaging studies  CBC: Recent Labs  Lab 04/25/23 0950 04/25/23 1040  WBC 4.1  --   NEUTROABS 3.7  --   HGB 13.1 13.3  HCT 39.9 39.0  MCV 81.4  --   PLT 318  --    Basic Metabolic Panel: Recent Labs  Lab  04/25/23 0950 04/25/23 1040  NA 135 136  K 3.9 3.3*  CL 97*  --   CO2 23  --   GLUCOSE 158*  --   BUN 16  --   CREATININE 1.17*  --   CALCIUM 8.9  --    GFR: Estimated Creatinine Clearance: 41.5 mL/min (A) (by C-G formula based on SCr of 1.17 mg/dL (H)). Liver Function Tests: Recent Labs  Lab 04/25/23 0950  AST 27  ALT 18  ALKPHOS 81  BILITOT 0.5  PROT 7.3  ALBUMIN 3.1*   No results for input(s): "LIPASE", "AMYLASE" in the last 168 hours. No results for input(s): "AMMONIA" in the last 168 hours. Coagulation Profile: Recent  Labs  Lab 04/25/23 1145  INR 1.2   Cardiac Enzymes: No results for input(s): "CKTOTAL", "CKMB", "CKMBINDEX", "TROPONINI" in the last 168 hours. BNP (last 3 results) No results for input(s): "PROBNP" in the last 8760 hours. HbA1C: No results for input(s): "HGBA1C" in the last 72 hours. CBG: Recent Labs  Lab 04/25/23 1022  GLUCAP 147*   Lipid Profile: No results for input(s): "CHOL", "HDL", "LDLCALC", "TRIG", "CHOLHDL", "LDLDIRECT" in the last 72 hours. Thyroid Function Tests: No results for input(s): "TSH", "T4TOTAL", "FREET4", "T3FREE", "THYROIDAB" in the last 72 hours. Anemia Panel: No results for input(s): "VITAMINB12", "FOLATE", "FERRITIN", "TIBC", "IRON", "RETICCTPCT" in the last 72 hours. Urine analysis:    Component Value Date/Time   COLORURINE YELLOW 04/25/2023 1300   APPEARANCEUR CLOUDY (A) 04/25/2023 1300   LABSPEC 1.012 04/25/2023 1300   PHURINE 5.0 04/25/2023 1300   GLUCOSEU NEGATIVE 04/25/2023 1300   HGBUR LARGE (A) 04/25/2023 1300   BILIRUBINUR NEGATIVE 04/25/2023 1300   KETONESUR NEGATIVE 04/25/2023 1300   PROTEINUR 30 (A) 04/25/2023 1300   NITRITE POSITIVE (A) 04/25/2023 1300   LEUKOCYTESUR MODERATE (A) 04/25/2023 1300    Radiological Exams on Admission: CT Angio Chest PE W and/or Wo Contrast  Result Date: 04/25/2023 CLINICAL DATA:  Pulmonary embolism (PE) suspected, high prob EXAM: CT ANGIOGRAPHY CHEST WITH  CONTRAST TECHNIQUE: Multidetector CT imaging of the chest was performed using the standard protocol during bolus administration of intravenous contrast. Multiplanar CT image reconstructions and MIPs were obtained to evaluate the vascular anatomy. RADIATION DOSE REDUCTION: This exam was performed according to the departmental dose-optimization program which includes automated exposure control, adjustment of the mA and/or kV according to patient size and/or use of iterative reconstruction technique. CONTRAST:  75mL OMNIPAQUE IOHEXOL 350 MG/ML SOLN COMPARISON:  Same day chest x-ray. FINDINGS: Cardiovascular: Satisfactory opacification of the pulmonary arteries to the segmental level. No evidence of pulmonary embolism. Trace pericardial fluid, likely physiologic. Heart is mildly enlarged. Atherosclerotic calcifications of the nonaneurysmal thoracic aorta. Three-vessel coronary artery atherosclerotic calcifications. Mediastinum/Nodes: No axillary or mediastinal adenopathy. Visualized thyroid is unremarkable. Lungs/Pleura: No pneumothorax. Trace RIGHT pleural effusion. Mild centrilobular emphysema. Multifocal debris in the trachea with bibasilar endobronchial debris, RIGHT greater than LEFT. There is a small amount of downstream centrilobular and consolidative opacities in the RIGHT lower lobe at the lung base. Mildly expansile mucous plugging in the superior aspect of the LEFT lower lobe (series 7, image 153). Upper Abdomen: Hepatic steatosis.  Tiny hiatal hernia. Musculoskeletal: Status post LEFT breast and axillary surgery. No acute osseous abnormality. Review of the MIP images confirms the above findings. IMPRESSION: 1. No evidence of pulmonary embolism. 2. Multifocal debris in the trachea with bibasilar endobronchial debris, RIGHT greater than LEFT. There is a small amount of downstream centrilobular and consolidative opacities in the RIGHT lower lobe at the lung base. Findings could reflect aspiration or infection.  Aortic Atherosclerosis (ICD10-I70.0) and Emphysema (ICD10-J43.9). Electronically Signed   By: Meda Klinefelter M.D.   On: 04/25/2023 14:42   CT Head Wo Contrast  Result Date: 04/25/2023 CLINICAL DATA:  Mental status change, unknown cause. EXAM: CT HEAD WITHOUT CONTRAST TECHNIQUE: Contiguous axial images were obtained from the base of the skull through the vertex without intravenous contrast. RADIATION DOSE REDUCTION: This exam was performed according to the departmental dose-optimization program which includes automated exposure control, adjustment of the mA and/or kV according to patient size and/or use of iterative reconstruction technique. COMPARISON:  None Available. FINDINGS: Brain: No acute intracranial hemorrhage. Age-indeterminate, but likely  chronic lacunar infarcts in the bilateral basal ganglia. Patchy hypoattenuation of the periventricular white matter, most consistent with a mild chronic small-vessel disease. No hydrocephalus or extra-axial collection. No mass effect or midline shift. Vascular: No hyperdense vessel or unexpected calcification. Skull: No calvarial fracture or suspicious bone lesion. Skull base is unremarkable. Sinuses/Orbits: Chronic right sphenoid sinusitis. Other: None. IMPRESSION: 1. No acute intracranial hemorrhage. 2. 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 Electronically Signed   By: Orvan Falconer M.D.   On: 04/25/2023 14:35   DG Chest Port 1 View  Result Date: 04/25/2023 CLINICAL DATA:  Cough, dyspnea. EXAM: PORTABLE CHEST 1 VIEW COMPARISON:  None Available. FINDINGS: Heart size is upper limits of normal. Surgical clips in left axilla. Mildly prominent interstitial lung markings. No overt pulmonary edema. Atherosclerotic calcifications at the aortic arch. Negative for a pneumothorax. No acute bone abnormality. Mild irregularity involving bilateral ribs probably represent old fractures. IMPRESSION: Mildly prominent  interstitial lung markings. Findings are nonspecific but could be associated with chronic changes. Difficult to exclude mild interstitial edema. Electronically Signed   By: Richarda Overlie M.D.   On: 04/25/2023 10:21    EKG: Independently reviewed. Sinus, no acute ST changes, QTc 517  Assessment/Plan Principal Problem:   UTI (urinary tract infection) Active Problems:   Acute encephalopathy  (please populate well all problems here in Problem List. (For example, if patient is on BP meds at home and you resume or decide to hold them, it is a problem that needs to be her. Same for CAD, COPD, HLD and so on)  Sepsis -Improved -Evidenced by fever, elevated lactate level, with symptoms of acute encephalopathy, source of infection considered to be pneumonia and concurrent UTI. -Encephalopathy resolved in the ED after given analgesic and along with fever comes down and IVF bolus -Given that infection source now is narrowed down to CAP and UTI, will discontinue vancomycin and cefepime, start ceftriaxone and azithromycin -Check atypical pneumonia study as well as strep pneumonia antigen -Other DDx, unhealing ulcer right big toe appears to be stable no signs of active infection.  CAP -As above  UTI -Family reported that last time patient had UTI with sepsis was severe and associated with kidney stone. -Will get renal ultrasound to rule out any obstructive uropathy -Continue ceftriaxone for now while waiting for urine culture  COPD, poorly controlled -Educated patient regarding concurrent with Dulera use -DuoNebs and as needed albuterol  PVD with chronic nonhealing ulcer right big toe -Clinically appears to be stable, family reported that the wound has been taking care of with weekly wound care and the healing process has been satisfying -Will consult wound care while patient here.   DVT prophylaxis: Lovenox Code Status: Full code Family Communication: Daughter at bedside Disposition Plan: Expect  less than 2 midnight hospital stay Consults called: none Admission status: Tele obs   Emeline General MD [facility] Pager [phone] Password [email or web address]  04/25/2023, 3:34 PM

## 2023-04-25 NOTE — ED Notes (Signed)
ED TO INPATIENT HANDOFF REPORT  ED Nurse Name and Phone #:  Marcello Moores [phone]  S Name/Age/Gender [name] 72 y.o. female Room/Bed: 031C/031C  Code Status   Code Status: Full Code  Home/SNF/Other Home Patient oriented to: self, place, time, and situation (intermittent) Is this baseline? Yes      Chief Complaint UTI (urinary tract infection) [N39.0]  Triage Note Patient presents to [facility] states patient was c/o sob was inmoderate distress upon ems arrival was in tri-pod position. Patient was given Albuterol 5 mg and atrovent  duo neb. Mag 2 g and soul -medrol 125 mg IV . Ems states patient was breathing much better. States patient was alert oriented, upon arrival patient could not tell me where she was or what day it was, ask her how long she had been sick stated I dont know. Ems states cough is more productive after breathing treatment. Per daughter patient was sick 2 weeks ago with stomach bug appearted betteer, x2 days stomach started getting upset again. Goes to wound care for right foot. Weaker than normal 2 days ago.    Allergies Allergies  Allergen Reactions   Penicillins Rash   Oxycodone    Vicodin [Hydrocodone-Acetaminophen]     Level of Care/Admitting Diagnosis ED Disposition     ED Disposition  Admit   Condition  --   Comment  Hospital Area: [facility] [[id number]]  Level of Care: Telemetry Medical [104]  May place patient in observation at [facility] or [facility] if equivalent level of care is available:: No  Covid Evaluation: Asymptomatic - no recent exposure (last 10 days) testing not required  Diagnosis: UTI (urinary tract infection) [454098]  Admitting Physician: Emeline General [[id number]]  Attending Physician: Emeline General [[id number]]          B Medical/Surgery History 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   CAD (coronary artery disease)    COPD  (chronic obstructive pulmonary disease) (HCC)    Frequent UTI    Kidney stones    Past Surgical History:  Procedure Laterality Date   ANKLE FUSION Right 2017   fibular graft  Bilateral    bilater fibular missing and replaced in hips   HIP FRACTURE SURGERY Bilateral    LOWER EXTREMITY ANGIOGRAPHY Right 01/09/2023   Procedure: Lower Extremity Angiography;  Surgeon: Renford Dills, MD;  Location: [facility] INVASIVE CV LAB;  Service: Cardiovascular;  Laterality: Right;   MASTECTOMY, PARTIAL Left 2003   ROTATOR CUFF REPAIR Left    X 2   TONSILLECTOMY       A IV Location/Drains/Wounds Patient Lines/Drains/Airways Status     Active Line/Drains/Airways     Name Placement date Placement time Site Days   Peripheral IV 01/08/23 Anterior;Left Forearm 01/08/23  0933  Forearm  107   Peripheral IV 01/09/23 20 G 1" Posterior;Right Hand 01/09/23  1231  Hand  106   Peripheral IV 04/25/23 22 G Anterior;Left Hand 04/25/23  --  Hand  less than 1   Peripheral IV 04/25/23 20 G Left Antecubital 04/25/23  --  Antecubital  less than 1   Wound / Incision (Open or Dehisced) 01/06/23 Other (Comment) Toe (Comment  which one) Right;Other (Comment) necrotic tissue 01/06/23  2000  Toe (Comment  which one)  109            Intake/Output Last 24 hours No intake  or output data in the 24 hours ending 04/25/23 1538  Labs/Imaging Results for orders placed or performed during the hospital encounter of 04/25/23 (from the past 48 hour(s))  CBC with Differential     Status: Abnormal   Collection Time: 04/25/23  9:50 AM  Result Value Ref Range   WBC 4.1 4.0 - 10.5 K/uL   RBC 4.90 3.87 - 5.11 MIL/uL   Hemoglobin 13.1 12.0 - 15.0 g/dL   HCT 16.1 09.6 - 04.5 %   MCV 81.4 80.0 - 100.0 fL   MCH 26.7 26.0 - 34.0 pg   MCHC 32.8 30.0 - 36.0 g/dL   RDW 40.9 81.1 - 91.4 %   Platelets 318 150 - 400 K/uL   nRBC 0.0 0.0 - 0.2 %   Neutrophils Relative % 91 %   Neutro Abs 3.7 1.7 - 7.7 K/uL   Lymphocytes Relative 5 %    Lymphs Abs 0.2 (L) 0.7 - 4.0 K/uL   Monocytes Relative 1 %   Monocytes Absolute 0.1 0.1 - 1.0 K/uL   Eosinophils Relative 2 %   Eosinophils Absolute 0.1 0.0 - 0.5 K/uL   Basophils Relative 1 %   Basophils Absolute 0.0 0.0 - 0.1 K/uL   Immature Granulocytes 0 %   Abs Immature Granulocytes 0.01 0.00 - 0.07 K/uL    Comment: Performed at [facility], [address] [place], [place] [address]  Comprehensive metabolic panel     Status: Abnormal   Collection Time: 04/25/23  9:50 AM  Result Value Ref Range   Sodium 135 135 - 145 mmol/L   Potassium 3.9 3.5 - 5.1 mmol/L   Chloride 97 (L) 98 - 111 mmol/L   CO2 23 22 - 32 mmol/L   Glucose, Bld 158 (H) 70 - 99 mg/dL    Comment: Glucose reference range applies only to samples taken after fasting for at least 8 hours.   BUN 16 8 - 23 mg/dL   Creatinine, Ser 6.21 (H) 0.44 - 1.00 mg/dL   Calcium 8.9 8.9 - 30.8 mg/dL   Total Protein 7.3 6.5 - 8.1 g/dL   Albumin 3.1 (L) 3.5 - 5.0 g/dL   AST 27 15 - 41 U/L   ALT 18 0 - 44 U/L   Alkaline Phosphatase 81 38 - 126 U/L   Total Bilirubin 0.5 0.3 - 1.2 mg/dL   GFR, Estimated 50 (L) >60 mL/min    Comment: (NOTE) Calculated using the CKD-EPI Creatinine Equation (2021)    Anion gap 15 5 - 15    Comment: Performed at [facility], [address], [place], [place] [address]  D-dimer, quantitative     Status: Abnormal   Collection Time: 04/25/23  9:50 AM  Result Value Ref Range   D-Dimer, Quant 2.64 (H) 0.00 - 0.50 ug/mL-FEU    Comment: (NOTE) At the manufacturer cut-off value of 0.5 g/mL FEU, this assay has a negative predictive value of 95-100%.This assay is intended for use in conjunction with a clinical pretest probability (PTP) assessment model to exclude pulmonary embolism (PE) and deep venous thrombosis (DVT) in outpatients suspected of PE or DVT. Results should be correlated with clinical presentation. Performed at [facility], [address] [place], [place] [address]    CBG monitoring, ED     Status: Abnormal   Collection Time: 04/25/23 10:22 AM  Result Value Ref Range   Glucose-Capillary 147 (H) 70 - 99 mg/dL    Comment: Glucose reference range applies  only to samples taken after fasting for at least 8 hours.  I-Stat venous blood gas, ED     Status: Abnormal   Collection Time: 04/25/23 10:40 AM  Result Value Ref Range   pH, Ven 7.404 7.25 - 7.43   pCO2, Ven 40.2 (L) 44 - 60 mmHg   pO2, Ven 138 (H) 32 - 45 mmHg   Bicarbonate 25.1 20.0 - 28.0 mmol/L   TCO2 26 22 - 32 mmol/L   O2 Saturation 99 %   Acid-Base Excess 0.0 0.0 - 2.0 mmol/L   Sodium 136 135 - 145 mmol/L   Potassium 3.3 (L) 3.5 - 5.1 mmol/L   Calcium, Ion 1.13 (L) 1.15 - 1.40 mmol/L   HCT 39.0 36.0 - 46.0 %   Hemoglobin 13.3 12.0 - 15.0 g/dL   Sample type VENOUS   Protime-INR     Status: None   Collection Time: 04/25/23 11:45 AM  Result Value Ref Range   Prothrombin Time 15.2 11.4 - 15.2 seconds   INR 1.2 0.8 - 1.2    Comment: (NOTE) INR goal varies based on device and disease states. Performed at [facility], [address], [place], [place] [address]   APTT     Status: None   Collection Time: 04/25/23 11:45 AM  Result Value Ref Range   aPTT 29 24 - 36 seconds    Comment: Performed at [facility], [address] [place], [place] [address]  Lactic acid, plasma     Status: Abnormal   Collection Time: 04/25/23 12:02 PM  Result Value Ref Range   Lactic Acid, Venous 2.1 (HH) 0.5 - 1.9 mmol/L    Comment: CRITICAL RESULT CALLED TO, READ BACK BY AND VERIFIED WITH C,YANG RN @1309  04/25/23 E,BENTON Performed at [facility], [address], [place], [place] [address]   Urinalysis, w/ Reflex to Culture (Infection Suspected) -Urine, Catheterized     Status: Abnormal   Collection Time: 04/25/23  1:00 PM  Result Value Ref Range   Specimen Source URINE, CATHETERIZED    Color, Urine YELLOW YELLOW   APPearance CLOUDY (A) CLEAR   Specific Gravity, Urine 1.012 1.005 -  1.030   pH 5.0 5.0 - 8.0   Glucose, UA NEGATIVE NEGATIVE mg/dL   Hgb urine dipstick LARGE (A) NEGATIVE   Bilirubin Urine NEGATIVE NEGATIVE   Ketones, ur NEGATIVE NEGATIVE mg/dL   Protein, ur 30 (A) NEGATIVE mg/dL   Nitrite POSITIVE (A) NEGATIVE   Leukocytes,Ua MODERATE (A) NEGATIVE   RBC / HPF >50 0 - 5 RBC/hpf   WBC, UA >50 0 - 5 WBC/hpf    Comment:        Reflex urine culture not performed if WBC <=10, OR if Squamous epithelial cells >5. If Squamous epithelial cells >5 suggest recollection.    Bacteria, UA MANY (A) NONE SEEN   Squamous Epithelial / HPF 0-5 0 - 5 /HPF   WBC Clumps PRESENT    Mucus PRESENT     Comment: Performed at [facility], [address] [place], [place] [address]   CT Angio Chest PE W and/or Wo Contrast  Result Date: 04/25/2023 CLINICAL DATA:  Pulmonary embolism (PE) suspected, high prob EXAM: CT ANGIOGRAPHY CHEST WITH CONTRAST TECHNIQUE: Multidetector CT imaging of the chest was performed using the standard protocol during bolus administration of intravenous contrast. Multiplanar CT image reconstructions and MIPs were obtained to evaluate the vascular anatomy. RADIATION DOSE REDUCTION: This exam was performed according to  the departmental dose-optimization program which includes automated exposure control, adjustment of the mA and/or kV according to patient size and/or use of iterative reconstruction technique. CONTRAST:  75mL OMNIPAQUE IOHEXOL 350 MG/ML SOLN COMPARISON:  Same day chest x-ray. FINDINGS: Cardiovascular: Satisfactory opacification of the pulmonary arteries to the segmental level. No evidence of pulmonary embolism. Trace pericardial fluid, likely physiologic. Heart is mildly enlarged. Atherosclerotic calcifications of the nonaneurysmal thoracic aorta. Three-vessel coronary artery atherosclerotic calcifications. Mediastinum/Nodes: No axillary or mediastinal adenopathy. Visualized thyroid is unremarkable. Lungs/Pleura: No pneumothorax. Trace RIGHT  pleural effusion. Mild centrilobular emphysema. Multifocal debris in the trachea with bibasilar endobronchial debris, RIGHT greater than LEFT. There is a small amount of downstream centrilobular and consolidative opacities in the RIGHT lower lobe at the lung base. Mildly expansile mucous plugging in the superior aspect of the LEFT lower lobe (series 7, image 153). Upper Abdomen: Hepatic steatosis.  Tiny hiatal hernia. Musculoskeletal: Status post LEFT breast and axillary surgery. No acute osseous abnormality. Review of the MIP images confirms the above findings. IMPRESSION: 1. No evidence of pulmonary embolism. 2. Multifocal debris in the trachea with bibasilar endobronchial debris, RIGHT greater than LEFT. There is a small amount of downstream centrilobular and consolidative opacities in the RIGHT lower lobe at the lung base. Findings could reflect aspiration or infection. Aortic Atherosclerosis (ICD10-I70.0) and Emphysema (ICD10-J43.9). Electronically Signed   By: Meda Klinefelter M.D.   On: 04/25/2023 14:42   CT Head Wo Contrast  Result Date: 04/25/2023 CLINICAL DATA:  Mental status change, unknown cause. EXAM: CT HEAD WITHOUT CONTRAST TECHNIQUE: Contiguous axial images were obtained from the base of the skull through the vertex without intravenous contrast. RADIATION DOSE REDUCTION: This exam was performed according to the departmental dose-optimization program which includes automated exposure control, adjustment of the mA and/or kV according to patient size and/or use of iterative reconstruction technique. COMPARISON:  None Available. FINDINGS: Brain: No acute intracranial hemorrhage. 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 Patchy hypoattenuation of the periventricular white matter, most consistent with a mild chronic small-vessel disease. No hydrocephalus or extra-axial collection. No mass effect or midline shift. Vascular: No hyperdense vessel or unexpected  calcification. Skull: No calvarial fracture or suspicious bone lesion. Skull base is unremarkable. Sinuses/Orbits: Chronic right sphenoid sinusitis. Other: None. IMPRESSION: 1. No acute intracranial hemorrhage. 2. 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 Electronically Signed   By: Orvan Falconer M.D.   On: 04/25/2023 14:35   DG Chest Port 1 View  Result Date: 04/25/2023 CLINICAL DATA:  Cough, dyspnea. EXAM: PORTABLE CHEST 1 VIEW COMPARISON:  None Available. FINDINGS: Heart size is upper limits of normal. Surgical clips in left axilla. Mildly prominent interstitial lung markings. No overt pulmonary edema. Atherosclerotic calcifications at the aortic arch. Negative for a pneumothorax. No acute bone abnormality. Mild irregularity involving bilateral ribs probably represent old fractures. IMPRESSION: Mildly prominent interstitial lung markings. Findings are nonspecific but could be associated with chronic changes. Difficult to exclude mild interstitial edema. Electronically Signed   By: Richarda Overlie M.D.   On: 04/25/2023 10:21    Pending Labs Unresulted Labs (From admission, onward)     Start     Ordered   04/26/23 0500  CBC  Tomorrow morning,   R        04/25/23 1533   04/25/23 1300  Urine Culture  Once,   R        04/25/23 1300   04/25/23 1146  MRSA Next  Gen by PCR, Nasal  (MRSA Screening)  Once,   URGENT        04/25/23 1145   04/25/23 1138  Lactic acid, plasma  (Undifferentiated presentation (screening labs and basic nursing orders))  Now then every 2 hours,   R (with STAT occurrences)      04/25/23 1137   04/25/23 1138  Blood Culture (routine x 2)  (Undifferentiated presentation (screening labs and basic nursing orders))  BLOOD CULTURE X 2,   STAT      04/25/23 1137   04/25/23 1041  Brain natriuretic peptide  Once,   URGENT        04/25/23 1040   04/25/23 0954  Blood gas, venous (at [facility] and [facility])  Once,   R        04/25/23 0954             Vitals/Pain Today's Vitals   04/25/23 1045 04/25/23 1145 04/25/23 1315 04/25/23 1347  BP: (!) 135/92 103/60 129/82 119/67  Pulse: 92 83 82 80  Resp: (!) 24 (!) 23 19 19   Temp:      TempSrc:      SpO2: 96% 93% 94% 91%  Weight:      Height:        Isolation Precautions No active isolations  Medications Medications  lactated ringers infusion ( Intravenous New Bag/Given 04/25/23 1341)  vancomycin (VANCOREADY) IVPB 1500 mg/300 mL (1,500 mg Intravenous New Bag/Given 04/25/23 1344)  acetaminophen (TYLENOL) tablet 1,000 mg (has no administration in time range)  aspirin EC tablet 81 mg (has no administration in time range)  amLODipine (NORVASC) tablet 10 mg (has no administration in time range)  carvedilol (COREG) tablet 12.5 mg (has no administration in time range)  clopidogrel (PLAVIX) tablet 75 mg (has no administration in time range)  Melatonin TABS (has no administration in time range)  albuterol (PROVENTIL) (2.5 MG/3ML) 0.083% nebulizer solution 2.5 mg (has no administration in time range)  loratadine (CLARITIN) tablet 10 mg (has no administration in time range)  mometasone-formoterol (DULERA) 200-5 MCG/ACT inhaler 2 puff (has no administration in time range)  guaiFENesin (MUCINEX) 12 hr tablet 1,200 mg (has no administration in time range)  enoxaparin (LOVENOX) injection 40 mg (has no administration in time range)  ondansetron (ZOFRAN) tablet 4 mg (has no administration in time range)    Or  ondansetron (ZOFRAN) injection 4 mg (has no administration in time range)  cefTRIAXone (ROCEPHIN) 1 g in sodium chloride 0.9 % 100 mL IVPB (has no administration in time range)  lactated ringers bolus 1,000 mL (0 mLs Intravenous Stopped 04/25/23 1303)  metroNIDAZOLE (FLAGYL) IVPB 500 mg (0 mg Intravenous Stopped 04/25/23 1303)  ceFEPIme (MAXIPIME) 2 g in sodium chloride 0.9 % 100 mL IVPB (0 g Intravenous Stopped 04/25/23 1240)  acetaminophen (TYLENOL) tablet 650 mg (650 mg Oral Given 04/25/23 1149)   iohexol (OMNIPAQUE) 350 MG/ML injection 75 mL (75 mLs Intravenous Contrast Given 04/25/23 1424)    Mobility walks     Focused Assessments Pulmonary Assessment Handoff:  Lung sounds: L Breath Sounds: Rhonchi, Expiratory wheezes R Breath Sounds: Expiratory wheezes, Rhonchi        R Recommendations: See Admitting Provider Note  Report given to:   Additional Notes:

## 2023-04-26 DIAGNOSIS — N200 Calculus of kidney: Secondary | ICD-10-CM | POA: Diagnosis not present

## 2023-04-26 DIAGNOSIS — K521 Toxic gastroenteritis and colitis: Secondary | ICD-10-CM | POA: Diagnosis not present

## 2023-04-26 DIAGNOSIS — J42 Unspecified chronic bronchitis: Secondary | ICD-10-CM | POA: Diagnosis not present

## 2023-04-26 DIAGNOSIS — T3695XA Adverse effect of unspecified systemic antibiotic, initial encounter: Secondary | ICD-10-CM | POA: Diagnosis not present

## 2023-04-26 DIAGNOSIS — Z981 Arthrodesis status: Secondary | ICD-10-CM | POA: Diagnosis not present

## 2023-04-26 DIAGNOSIS — J690 Pneumonitis due to inhalation of food and vomit: Secondary | ICD-10-CM | POA: Diagnosis present

## 2023-04-26 DIAGNOSIS — F919 Conduct disorder, unspecified: Secondary | ICD-10-CM | POA: Diagnosis present

## 2023-04-26 DIAGNOSIS — B9620 Unspecified Escherichia coli [E. coli] as the cause of diseases classified elsewhere: Secondary | ICD-10-CM | POA: Insufficient documentation

## 2023-04-26 DIAGNOSIS — Z751 Person awaiting admission to adequate facility elsewhere: Secondary | ICD-10-CM | POA: Diagnosis not present

## 2023-04-26 DIAGNOSIS — Z7982 Long term (current) use of aspirin: Secondary | ICD-10-CM | POA: Diagnosis not present

## 2023-04-26 DIAGNOSIS — R7881 Bacteremia: Secondary | ICD-10-CM | POA: Insufficient documentation

## 2023-04-26 DIAGNOSIS — N390 Urinary tract infection, site not specified: Secondary | ICD-10-CM | POA: Diagnosis present

## 2023-04-26 DIAGNOSIS — N179 Acute kidney failure, unspecified: Secondary | ICD-10-CM | POA: Diagnosis not present

## 2023-04-26 DIAGNOSIS — L97519 Non-pressure chronic ulcer of other part of right foot with unspecified severity: Secondary | ICD-10-CM | POA: Diagnosis present

## 2023-04-26 DIAGNOSIS — Z7902 Long term (current) use of antithrombotics/antiplatelets: Secondary | ICD-10-CM | POA: Diagnosis not present

## 2023-04-26 DIAGNOSIS — I2510 Atherosclerotic heart disease of native coronary artery without angina pectoris: Secondary | ICD-10-CM | POA: Diagnosis present

## 2023-04-26 DIAGNOSIS — E876 Hypokalemia: Secondary | ICD-10-CM | POA: Diagnosis not present

## 2023-04-26 DIAGNOSIS — J440 Chronic obstructive pulmonary disease with acute lower respiratory infection: Secondary | ICD-10-CM | POA: Diagnosis present

## 2023-04-26 DIAGNOSIS — N136 Pyonephrosis: Secondary | ICD-10-CM | POA: Diagnosis present

## 2023-04-26 DIAGNOSIS — N3001 Acute cystitis with hematuria: Secondary | ICD-10-CM | POA: Diagnosis not present

## 2023-04-26 DIAGNOSIS — G9340 Encephalopathy, unspecified: Secondary | ICD-10-CM | POA: Diagnosis not present

## 2023-04-26 DIAGNOSIS — R6520 Severe sepsis without septic shock: Secondary | ICD-10-CM | POA: Diagnosis present

## 2023-04-26 DIAGNOSIS — Z880 Allergy status to penicillin: Secondary | ICD-10-CM | POA: Diagnosis not present

## 2023-04-26 DIAGNOSIS — Z87891 Personal history of nicotine dependence: Secondary | ICD-10-CM | POA: Diagnosis not present

## 2023-04-26 DIAGNOSIS — Z87440 Personal history of urinary (tract) infections: Secondary | ICD-10-CM | POA: Diagnosis not present

## 2023-04-26 DIAGNOSIS — I739 Peripheral vascular disease, unspecified: Secondary | ICD-10-CM | POA: Diagnosis present

## 2023-04-26 DIAGNOSIS — I10 Essential (primary) hypertension: Secondary | ICD-10-CM | POA: Diagnosis present

## 2023-04-26 DIAGNOSIS — Z79899 Other long term (current) drug therapy: Secondary | ICD-10-CM | POA: Diagnosis not present

## 2023-04-26 DIAGNOSIS — Z7409 Other reduced mobility: Secondary | ICD-10-CM | POA: Diagnosis present

## 2023-04-26 DIAGNOSIS — A4151 Sepsis due to Escherichia coli [E. coli]: Secondary | ICD-10-CM | POA: Diagnosis present

## 2023-04-26 DIAGNOSIS — A419 Sepsis, unspecified organism: Secondary | ICD-10-CM | POA: Diagnosis not present

## 2023-04-26 DIAGNOSIS — Z1612 Extended spectrum beta lactamase (ESBL) resistance: Secondary | ICD-10-CM | POA: Diagnosis present

## 2023-04-26 LAB — BLOOD CULTURE ID PANEL (REFLEXED) - BCID2

## 2023-04-26 LAB — URINE CULTURE: Culture: 80000 — AB

## 2023-04-26 LAB — CBC
HCT: 32.3 % — ABNORMAL LOW (ref 36.0–46.0)
Hemoglobin: 10.4 g/dL — ABNORMAL LOW (ref 12.0–15.0)
MCH: 26.4 pg (ref 26.0–34.0)
MCHC: 32.2 g/dL (ref 30.0–36.0)
MCV: 82 fL (ref 80.0–100.0)
Platelets: 283 10*3/uL (ref 150–400)
RBC: 3.94 MIL/uL (ref 3.87–5.11)
RDW: 13.2 % (ref 11.5–15.5)
WBC: 18.2 10*3/uL — ABNORMAL HIGH (ref 4.0–10.5)
nRBC: 0 % (ref 0.0–0.2)

## 2023-04-26 LAB — PROCALCITONIN: Procalcitonin: 15.35 ng/mL

## 2023-04-26 LAB — CULTURE, BLOOD (ROUTINE X 2)

## 2023-04-26 MED ORDER — POTASSIUM CHLORIDE CRYS ER 20 MEQ PO TBCR
40.0000 meq | EXTENDED_RELEASE_TABLET | Freq: Once | ORAL | Status: AC
  Administered 2023-04-26: 40 meq via ORAL
  Filled 2023-04-26: qty 2

## 2023-04-26 MED ORDER — SODIUM CHLORIDE 0.9 % IV SOLN
1.0000 g | Freq: Two times a day (BID) | INTRAVENOUS | Status: DC
  Administered 2023-04-26 – 2023-04-28 (×5): 1 g via INTRAVENOUS
  Filled 2023-04-26 (×6): qty 20

## 2023-04-26 MED ORDER — ACETAMINOPHEN 325 MG PO TABS
650.0000 mg | ORAL_TABLET | Freq: Four times a day (QID) | ORAL | Status: DC | PRN
  Administered 2023-04-26 – 2023-05-06 (×11): 650 mg via ORAL
  Filled 2023-04-26 (×11): qty 2

## 2023-04-26 NOTE — Progress Notes (Signed)
PHARMACY - PHYSICIAN COMMUNICATION CRITICAL VALUE ALERT - BLOOD CULTURE IDENTIFICATION (BCID)  [name] is an 72 y.o. female who presented to [facility] on 04/25/2023 with a chief complaint of cough/SOB/sepsis  Assessment:   1/2 blood cultures growing ESBL E. coli  Name of physician (or Provider) Contacted:  Dr. Antionette Char  Current antibiotics:  Rocephin and Azithromycin  Changes to prescribed antibiotics recommended:  Change to Meropenem 1 g IV q12h  Results for orders placed or performed during the hospital encounter of 04/25/23  Blood Culture ID Panel (Reflexed) (Collected: 04/25/2023 11:55 AM)  Result Value Ref Range   Enterococcus faecalis NOT DETECTED NOT DETECTED   Enterococcus Faecium NOT DETECTED NOT DETECTED   Listeria monocytogenes NOT DETECTED NOT DETECTED   Staphylococcus species NOT DETECTED NOT DETECTED   Staphylococcus aureus (BCID) NOT DETECTED NOT DETECTED   Staphylococcus epidermidis NOT DETECTED NOT DETECTED   Staphylococcus lugdunensis NOT DETECTED NOT DETECTED   Streptococcus species NOT DETECTED NOT DETECTED   Streptococcus agalactiae NOT DETECTED NOT DETECTED   Streptococcus pneumoniae NOT DETECTED NOT DETECTED   Streptococcus pyogenes NOT DETECTED NOT DETECTED   A.calcoaceticus-baumannii NOT DETECTED NOT DETECTED   Bacteroides fragilis NOT DETECTED NOT DETECTED   Enterobacterales DETECTED (A) NOT DETECTED   Enterobacter cloacae complex NOT DETECTED NOT DETECTED   Escherichia coli DETECTED (A) NOT DETECTED   Klebsiella aerogenes NOT DETECTED NOT DETECTED   Klebsiella oxytoca NOT DETECTED NOT DETECTED   Klebsiella pneumoniae NOT DETECTED NOT DETECTED   Proteus species NOT DETECTED NOT DETECTED   Salmonella species NOT DETECTED NOT DETECTED   Serratia marcescens NOT DETECTED NOT DETECTED   Haemophilus influenzae NOT DETECTED NOT DETECTED   Neisseria meningitidis NOT DETECTED NOT DETECTED   Pseudomonas aeruginosa NOT DETECTED NOT DETECTED    Stenotrophomonas maltophilia NOT DETECTED NOT DETECTED   Candida albicans NOT DETECTED NOT DETECTED   Candida auris NOT DETECTED NOT DETECTED   Candida glabrata NOT DETECTED NOT DETECTED   Candida krusei NOT DETECTED NOT DETECTED   Candida parapsilosis NOT DETECTED NOT DETECTED   Candida tropicalis NOT DETECTED NOT DETECTED   Cryptococcus neoformans/gattii NOT DETECTED NOT DETECTED   CTX-M ESBL DETECTED (A) NOT DETECTED   Carbapenem resistance IMP NOT DETECTED NOT DETECTED   Carbapenem resistance KPC NOT DETECTED NOT DETECTED   Carbapenem resistance NDM NOT DETECTED NOT DETECTED   Carbapenem resist OXA 48 LIKE NOT DETECTED NOT DETECTED   Carbapenem resistance VIM NOT DETECTED NOT DETECTED    Eddie Candle 04/26/2023  2:47 AM

## 2023-04-26 NOTE — Progress Notes (Signed)
PROGRESS NOTE    [name]  AVW:[id number] DOB: Sep 20, 1951 DOA: 04/25/2023 PCP: Patient, No Pcp Per   Brief Narrative:  [name] is a 72 y.o. female with medical history significant of poorly controlled COPD, PVD with chronic right big toe ulcer, recurrent UTIs, kidney stones, HTN, brought in by family member for evaluation of worsening of cough shortness of breath and admitted with presumed sepsis secondary to community-acquired pneumonia and UTI.   In the ED, patient was found to be febrile 102 F, borderline tachycardia nonhypoxic increasing breathing rate O2 saturation 91% on room air.  WBC 4.1, creatinine 1.1 K3.9 glucose 158 VBG 7 point 4/40/138.  Lactic acid 2.1  Assessment & Plan:   Principal Problem:   UTI (urinary tract infection) Active Problems:   Acute encephalopathy   E coli bacteremia  Sepsis secondary to presumed aspiration pneumonia as well as UTI with ESBL E. coli bacteremia, POA: Patient met criteria for sepsis based on fever of 102.7 and tachypnea.  Although patient says that she is feeling better but over the last 24 hours since admission, her blood culture is now growing ESBL E. coli, she was initially on cefepime which was transitioned to Merrem earlier this morning.  Waiting for final sensitivities.  Will also await urine cultures.  Urine antigen for streptococci negative, Legionella pending.  Continue bronchodilators.  Poorly controlled COPD: She is only on DuoNebs as needed.  Currently no shortness of breath, no wheezing on examination.  History of PVD with chronic nonhealing ulcer right great toe: Appears stable.  family reported that the wound has been taking care of with weekly wound care and the healing process has been satisfying.  Will consult wound care.  Continue aspirin and Plavix.  Essential hypertension: Blood pressure controlled.  Continue amlodipine and Coreg.  DVT prophylaxis: enoxaparin (LOVENOX) injection 40 mg Start: 04/25/23 1545   Code  Status: Full Code  Family Communication:  None present at bedside.  Plan of care discussed with patient in length and he/she verbalized understanding and agreed with it.  Also discussed with her daughter over the phone.  Status is: Observation The patient will require care spanning > 2 midnights and should be moved to inpatient because: Patient needs IV antibiotics and finalization of the blood culture.   Estimated body mass index is 27.69 kg/m as calculated from the following:   Height as of this encounter: 5\' 3"  (1.6 m).   Weight as of this encounter: 70.9 kg.    Nutritional Assessment: Body mass index is 27.69 kg/m.Marland Kitchen Seen by dietician.  I agree with the assessment and plan as outlined below: Nutrition Status:        . Skin Assessment: I have examined the patient's skin and I agree with the wound assessment as performed by the wound care RN as outlined below:    Consultants:  None  Procedures:  None  Antimicrobials:  Anti-infectives (From admission, onward)    Start     Dose/Rate Route Frequency Ordered Stop   04/27/23 0100  vancomycin (VANCOREADY) IVPB 1250 mg/250 mL  Status:  Discontinued        1,250 mg 166.7 mL/hr over 90 Minutes Intravenous Every 36 hours 04/25/23 1146 04/25/23 1533   04/26/23 0400  meropenem (MERREM) 1 g in sodium chloride 0.9 % 100 mL IVPB        1 g 200 mL/hr over 30 Minutes Intravenous Every 12 hours 04/26/23 0249     04/26/23 0100  ceFEPIme (MAXIPIME) 2 g in  sodium chloride 0.9 % 100 mL IVPB  Status:  Discontinued        2 g 200 mL/hr over 30 Minutes Intravenous Every 12 hours 04/25/23 1146 04/25/23 1533   04/25/23 1545  cefTRIAXone (ROCEPHIN) 1 g in sodium chloride 0.9 % 100 mL IVPB  Status:  Discontinued        1 g 200 mL/hr over 30 Minutes Intravenous Every 24 hours 04/25/23 1534 04/26/23 0249   04/25/23 1545  azithromycin (ZITHROMAX) tablet 500 mg  Status:  Discontinued        500 mg Oral Daily 04/25/23 1540 04/26/23 0249   04/25/23  1145  aztreonam (AZACTAM) 2 g in sodium chloride 0.9 % 100 mL IVPB  Status:  Discontinued        2 g 200 mL/hr over 30 Minutes Intravenous  Once 04/25/23 1139 04/25/23 1140   04/25/23 1145  metroNIDAZOLE (FLAGYL) IVPB 500 mg        500 mg 100 mL/hr over 60 Minutes Intravenous  Once 04/25/23 1139 04/25/23 1303   04/25/23 1145  vancomycin (VANCOCIN) IVPB 1000 mg/200 mL premix  Status:  Discontinued        1,000 mg 200 mL/hr over 60 Minutes Intravenous  Once 04/25/23 1139 04/25/23 1141   04/25/23 1145  ceFEPIme (MAXIPIME) 2 g in sodium chloride 0.9 % 100 mL IVPB        2 g 200 mL/hr over 30 Minutes Intravenous  Once 04/25/23 1141 04/25/23 1240   04/25/23 1145  vancomycin (VANCOREADY) IVPB 1500 mg/300 mL        1,500 mg 150 mL/hr over 120 Minutes Intravenous  Once 04/25/23 1141 04/25/23 1616         Subjective: Patient seen and examined.  She is complaining that she has been bothered by nursing many times overnight for lab draws and other things.  She was very angry and annoyed.  But otherwise she had no other specific complaint.  Objective: Vitals:   04/26/23 0543 04/26/23 0820 04/26/23 0825 04/26/23 0914  BP: (!) 127/59   118/67  Pulse: 67   68  Resp:    18  Temp: 97.6 F (36.4 C)   98.2 F (36.8 C)  TempSrc: Oral   Oral  SpO2: 95% 96% 96% 97%  Weight:      Height:        Intake/Output Summary (Last 24 hours) at 04/26/2023 1112 Last data filed at 04/26/2023 0600 Gross per 24 hour  Intake 2430.05 ml  Output 1400 ml  Net 1030.05 ml   Filed Weights   04/25/23 0941 04/25/23 1732  Weight: 72.6 kg 70.9 kg    Examination:  General exam: Appears calm and comfortable  Respiratory system: Clear to auscultation. Respiratory effort normal. Cardiovascular system: S1 & S2 heard, RRR. No JVD, murmurs, rubs, gallops or clicks. No pedal edema. Gastrointestinal system: Abdomen is nondistended, soft and nontender. No organomegaly or masses felt. Normal bowel sounds heard. Central  nervous system: Alert and oriented. No focal neurological deficits. Extremities: Symmetric 5 x 5 power. Skin: Chronic ulcer right great toe Psychiatry: Judgement and insight appear normal. Mood & affect appropriate.    Data Reviewed: I have personally reviewed following labs and imaging studies  CBC: Recent Labs  Lab 04/25/23 0950 04/25/23 1040 04/26/23 0928  WBC 4.1  --  18.2*  NEUTROABS 3.7  --   --   HGB 13.1 13.3 10.4*  HCT 39.9 39.0 32.3*  MCV 81.4  --  82.0  PLT  318  --  283   Basic Metabolic Panel: Recent Labs  Lab 04/25/23 0950 04/25/23 1040  NA 135 136  K 3.9 3.3*  CL 97*  --   CO2 23  --   GLUCOSE 158*  --   BUN 16  --   CREATININE 1.17*  --   CALCIUM 8.9  --    GFR: Estimated Creatinine Clearance: 41 mL/min (A) (by C-G formula based on SCr of 1.17 mg/dL (H)). Liver Function Tests: Recent Labs  Lab 04/25/23 0950  AST 27  ALT 18  ALKPHOS 81  BILITOT 0.5  PROT 7.3  ALBUMIN 3.1*   No results for input(s): "LIPASE", "AMYLASE" in the last 168 hours. No results for input(s): "AMMONIA" in the last 168 hours. Coagulation Profile: Recent Labs  Lab 04/25/23 1145  INR 1.2   Cardiac Enzymes: No results for input(s): "CKTOTAL", "CKMB", "CKMBINDEX", "TROPONINI" in the last 168 hours. BNP (last 3 results) No results for input(s): "PROBNP" in the last 8760 hours. HbA1C: No results for input(s): "HGBA1C" in the last 72 hours. CBG: Recent Labs  Lab 04/25/23 1022  GLUCAP 147*   Lipid Profile: No results for input(s): "CHOL", "HDL", "LDLCALC", "TRIG", "CHOLHDL", "LDLDIRECT" in the last 72 hours. Thyroid Function Tests: No results for input(s): "TSH", "T4TOTAL", "FREET4", "T3FREE", "THYROIDAB" in the last 72 hours. Anemia Panel: No results for input(s): "VITAMINB12", "FOLATE", "FERRITIN", "TIBC", "IRON", "RETICCTPCT" in the last 72 hours. Sepsis Labs: Recent Labs  Lab 04/25/23 1202 04/25/23 1618 04/26/23 0928  PROCALCITON  --   --  15.35   LATICACIDVEN 2.1* 1.3  --     Recent Results (from the past 240 hour(s))  MRSA Next Gen by PCR, Nasal     Status: None   Collection Time: 04/25/23 11:46 AM   Specimen: Nasal Mucosa; Nasal Swab  Result Value Ref Range Status   MRSA by PCR Next Gen NOT DETECTED NOT DETECTED Final    Comment: (NOTE) The GeneXpert MRSA Assay (FDA approved for NASAL specimens only), is one component of a comprehensive MRSA colonization surveillance program. It is not intended to diagnose MRSA infection nor to guide or monitor treatment for MRSA infections. Test performance is not FDA approved in patients less than 33 years old. Performed at [facility], [address] [place], [place] [address]   Blood Culture (routine x 2)     Status: None (Preliminary result)   Collection Time: 04/25/23 11:55 AM   Specimen: BLOOD  Result Value Ref Range Status   Specimen Description BLOOD LEFT ANTECUBITAL  Final   Special Requests   Final    BOTTLES DRAWN AEROBIC AND ANAEROBIC Blood Culture results may not be optimal due to an excessive volume of blood received in culture bottles   Culture  Setup Time   Final    GRAM NEGATIVE RODS IN BOTH AEROBIC AND ANAEROBIC BOTTLES CRITICAL RESULT CALLED TO, READ BACK BY AND VERIFIED WITH: PHARMD G. ABBOTT 04/26/2023 @ 0227 BY AB Performed at [facility], [address] [place], [place] [address]    Culture GRAM NEGATIVE RODS  Final   Report Status PENDING  Incomplete  Blood Culture ID Panel (Reflexed)     Status: Abnormal   Collection Time: 04/25/23 11:55 AM  Result Value Ref Range Status   Enterococcus faecalis NOT DETECTED NOT DETECTED Final   Enterococcus Faecium NOT DETECTED NOT DETECTED Final   Listeria monocytogenes NOT DETECTED NOT DETECTED Final   Staphylococcus species NOT DETECTED NOT  DETECTED Final   Staphylococcus aureus (BCID) NOT DETECTED NOT DETECTED Final   Staphylococcus epidermidis NOT DETECTED NOT DETECTED Final   Staphylococcus  lugdunensis NOT DETECTED NOT DETECTED Final   Streptococcus species NOT DETECTED NOT DETECTED Final   Streptococcus agalactiae NOT DETECTED NOT DETECTED Final   Streptococcus pneumoniae NOT DETECTED NOT DETECTED Final   Streptococcus pyogenes NOT DETECTED NOT DETECTED Final   A.calcoaceticus-baumannii NOT DETECTED NOT DETECTED Final   Bacteroides fragilis NOT DETECTED NOT DETECTED Final   Enterobacterales DETECTED (A) NOT DETECTED Final    Comment: Enterobacterales represent a large order of gram negative bacteria, not a single organism. CRITICAL RESULT CALLED TO, READ BACK BY AND VERIFIED WITH: PHARMD G. ABBOTT 04/26/2023 @ 0227 BY AB    Enterobacter cloacae complex NOT DETECTED NOT DETECTED Final   Escherichia coli DETECTED (A) NOT DETECTED Final    Comment: CRITICAL RESULT CALLED TO, READ BACK BY AND VERIFIED WITH: PHARMD G. ABBOTT 04/26/2023 @ 0227 BY AB    Klebsiella aerogenes NOT DETECTED NOT DETECTED Final   Klebsiella oxytoca NOT DETECTED NOT DETECTED Final   Klebsiella pneumoniae NOT DETECTED NOT DETECTED Final   Proteus species NOT DETECTED NOT DETECTED Final   Salmonella species NOT DETECTED NOT DETECTED Final   Serratia marcescens NOT DETECTED NOT DETECTED Final   Haemophilus influenzae NOT DETECTED NOT DETECTED Final   Neisseria meningitidis NOT DETECTED NOT DETECTED Final   Pseudomonas aeruginosa NOT DETECTED NOT DETECTED Final   Stenotrophomonas maltophilia NOT DETECTED NOT DETECTED Final   Candida albicans NOT DETECTED NOT DETECTED Final   Candida auris NOT DETECTED NOT DETECTED Final   Candida glabrata NOT DETECTED NOT DETECTED Final   Candida krusei NOT DETECTED NOT DETECTED Final   Candida parapsilosis NOT DETECTED NOT DETECTED Final   Candida tropicalis NOT DETECTED NOT DETECTED Final   Cryptococcus neoformans/gattii NOT DETECTED NOT DETECTED Final   CTX-M ESBL DETECTED (A) NOT DETECTED Final    Comment: CRITICAL RESULT CALLED TO, READ BACK BY AND VERIFIED  WITH: PHARMD G. ABBOTT 04/26/2023 @ 0227 BY AB (NOTE) Extended spectrum beta-lactamase detected. Recommend a carbapenem as initial therapy.      Carbapenem resistance IMP NOT DETECTED NOT DETECTED Final   Carbapenem resistance KPC NOT DETECTED NOT DETECTED Final   Carbapenem resistance NDM NOT DETECTED NOT DETECTED Final   Carbapenem resist OXA 48 LIKE NOT DETECTED NOT DETECTED Final   Carbapenem resistance VIM NOT DETECTED NOT DETECTED Final    Comment: Performed at [facility], [address], [place], [place] [address]  Blood Culture (routine x 2)     Status: None (Preliminary result)   Collection Time: 04/25/23 12:01 PM   Specimen: BLOOD RIGHT FOREARM  Result Value Ref Range Status   Specimen Description BLOOD RIGHT FOREARM  Final   Special Requests   Final    BOTTLES DRAWN AEROBIC AND ANAEROBIC Blood Culture adequate volume   Culture   Final    NO GROWTH < 24 HOURS Performed at [facility], [address] [place], [place] [address]    Report Status PENDING  Incomplete  Urine Culture     Status: None (Preliminary result)   Collection Time: 04/25/23  1:00 PM   Specimen: Urine, Random  Result Value Ref Range Status   Specimen Description URINE, RANDOM  Final   Special Requests   Final    URINE, CATHETERIZED Performed at [facility], [address], [place], [place] [address]  Culture PENDING  Incomplete   Report Status PENDING  Incomplete     Radiology Studies: US RENAL  Result Date: 04/25/2023 CLINICAL DATA:  [address] Renal stone [address] EXAM: RENAL / URINARY TRACT ULTRASOUND COMPLETE COMPARISON:  None Available. FINDINGS: Right Kidney: Renal measurements: 11.0 x 5.5 x 5.6 cm = volume: 176 mL. Echogenicity within normal limits. There is an echogenic focus with posterior acoustic shadowing measuring approximately 2.2 cm within the renal pelvis most consistent with a nephrolithiasis. There is mild adjacent hydronephrosis. Left Kidney: Renal measurements:  12.8 x 6.1 x 5.3 cm = volume: Changes 15 mL. Echogenicity within normal limits. No mass or definitive hydronephrosis visualized. Bladder: Completely decompressed. Other: None. IMPRESSION: There is a 2.2 cm nephrolithiasis within the right renal pelvis with mild upstream hydronephrosis. Electronically Signed   By: Meda Klinefelter M.D.   On: 04/25/2023 16:30   CT Angio Chest PE W and/or Wo Contrast  Result Date: 04/25/2023 CLINICAL DATA:  Pulmonary embolism (PE) suspected, high prob EXAM: CT ANGIOGRAPHY CHEST WITH CONTRAST TECHNIQUE: Multidetector CT imaging of the chest was performed using the standard protocol during bolus administration of intravenous contrast. Multiplanar CT image reconstructions and MIPs were obtained to evaluate the vascular anatomy. RADIATION DOSE REDUCTION: This exam was performed according to the departmental dose-optimization program which includes automated exposure control, adjustment of the mA and/or kV according to patient size and/or use of iterative reconstruction technique. CONTRAST:  75mL OMNIPAQUE IOHEXOL 350 MG/ML SOLN COMPARISON:  Same day chest x-ray. FINDINGS: Cardiovascular: Satisfactory opacification of the pulmonary arteries to the segmental level. No evidence of pulmonary embolism. Trace pericardial fluid, likely physiologic. Heart is mildly enlarged. Atherosclerotic calcifications of the nonaneurysmal thoracic aorta. Three-vessel coronary artery atherosclerotic calcifications. Mediastinum/Nodes: No axillary or mediastinal adenopathy. Visualized thyroid is unremarkable. Lungs/Pleura: No pneumothorax. Trace RIGHT pleural effusion. Mild centrilobular emphysema. Multifocal debris in the trachea with bibasilar endobronchial debris, RIGHT greater than LEFT. There is a small amount of downstream centrilobular and consolidative opacities in the RIGHT lower lobe at the lung base. Mildly expansile mucous plugging in the superior aspect of the LEFT lower lobe (series 7, image  153). Upper Abdomen: Hepatic steatosis.  Tiny hiatal hernia. Musculoskeletal: Status post LEFT breast and axillary surgery. No acute osseous abnormality. Review of the MIP images confirms the above findings. IMPRESSION: 1. No evidence of pulmonary embolism. 2. Multifocal debris in the trachea with bibasilar endobronchial debris, RIGHT greater than LEFT. There is a small amount of downstream centrilobular and consolidative opacities in the RIGHT lower lobe at the lung base. Findings could reflect aspiration or infection. Aortic Atherosclerosis (ICD10-I70.0) and Emphysema (ICD10-J43.9). Electronically Signed   By: Meda Klinefelter M.D.   On: 04/25/2023 14:42   CT Head Wo Contrast  Result Date: 04/25/2023 CLINICAL DATA:  Mental status change, unknown cause. EXAM: CT HEAD WITHOUT CONTRAST TECHNIQUE: Contiguous axial images were obtained from the base of the skull through the vertex without intravenous contrast. RADIATION DOSE REDUCTION: This exam was performed according to the departmental dose-optimization program which includes automated exposure control, adjustment of the mA and/or kV according to patient size and/or use of iterative reconstruction technique. COMPARISON:  None Available. FINDINGS: Brain: No acute intracranial hemorrhage. 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 Patchy hypoattenuation of the periventricular white matter, most consistent with a mild chronic small-vessel disease. No hydrocephalus or extra-axial collection. No mass effect or midline shift. Vascular: No hyperdense vessel or unexpected calcification. Skull: No calvarial fracture or suspicious bone lesion. Skull  base is unremarkable. Sinuses/Orbits: Chronic right sphenoid sinusitis. Other: None. IMPRESSION: 1. No acute intracranial hemorrhage. 2. 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 Electronically Signed   By: Orvan Falconer M.D.   On:  04/25/2023 14:35   DG Chest Port 1 View  Result Date: 04/25/2023 CLINICAL DATA:  Cough, dyspnea. EXAM: PORTABLE CHEST 1 VIEW COMPARISON:  None Available. FINDINGS: Heart size is upper limits of normal. Surgical clips in left axilla. Mildly prominent interstitial lung markings. No overt pulmonary edema. Atherosclerotic calcifications at the aortic arch. Negative for a pneumothorax. No acute bone abnormality. Mild irregularity involving bilateral ribs probably represent old fractures. IMPRESSION: Mildly prominent interstitial lung markings. Findings are nonspecific but could be associated with chronic changes. Difficult to exclude mild interstitial edema. Electronically Signed   By: Richarda Overlie M.D.   On: 04/25/2023 10:21    Scheduled Meds:  acetaminophen  1,000 mg Oral QHS   amLODipine  10 mg Oral Daily   aspirin EC  81 mg Oral Daily   carvedilol  12.5 mg Oral BID WC   clopidogrel  75 mg Oral Q breakfast   enoxaparin (LOVENOX) injection  40 mg Subcutaneous Q24H   guaiFENesin  1,200 mg Oral BID   loratadine  10 mg Oral Daily   melatonin  10 mg Oral QHS   mometasone-formoterol  2 puff Inhalation BID   Continuous Infusions:  meropenem (MERREM) IV 1 g (04/26/23 0406)     LOS: 0 days   Hughie Closs, MD [facility]  04/26/2023, 11:12 AM   *Please note that this is a verbal dictation therefore any spelling or grammatical errors are due to the "Dragon Medical One" system interpretation.  Please page via Amion and do not message via secure chat for urgent patient care matters. Secure chat can be used for non urgent patient care matters.  How to contact the [place] Attending or Consulting provider 7A - 7P or covering provider during after hours 7P -7A, for this patient?  Check the care team in [facility] and look for a) attending/consulting [facility] provider listed and b) the [facility] team listed. Page or secure chat 7A-7P. Log into [email or web address] and use [place]'s universal password to access. If you do  not have the password, please contact the hospital operator. Locate the [facility] provider you are looking for under [facility] and page to a number that you can be directly reached. If you still have difficulty reaching the provider, please page the [facility] (Director on Call) for the Hospitalists listed on amion for assistance.

## 2023-04-26 NOTE — Progress Notes (Signed)
Pt refused a.m. Labs

## 2023-04-27 DIAGNOSIS — A419 Sepsis, unspecified organism: Secondary | ICD-10-CM

## 2023-04-27 LAB — CBC WITH DIFFERENTIAL/PLATELET
Abs Immature Granulocytes: 0.08 10*3/uL — ABNORMAL HIGH (ref 0.00–0.07)
Basophils Absolute: 0 10*3/uL (ref 0.0–0.1)
Basophils Relative: 0 %
Eosinophils Absolute: 0.1 10*3/uL (ref 0.0–0.5)
Eosinophils Relative: 0 %
HCT: 33.7 % — ABNORMAL LOW (ref 36.0–46.0)
Hemoglobin: 10.7 g/dL — ABNORMAL LOW (ref 12.0–15.0)
Immature Granulocytes: 1 %
Lymphocytes Relative: 12 %
Lymphs Abs: 2 10*3/uL (ref 0.7–4.0)
MCH: 25.9 pg — ABNORMAL LOW (ref 26.0–34.0)
MCHC: 31.8 g/dL (ref 30.0–36.0)
MCV: 81.6 fL (ref 80.0–100.0)
Monocytes Absolute: 1.1 10*3/uL — ABNORMAL HIGH (ref 0.1–1.0)
Monocytes Relative: 7 %
Neutro Abs: 12.7 10*3/uL — ABNORMAL HIGH (ref 1.7–7.7)
Neutrophils Relative %: 80 %
Platelets: 315 10*3/uL (ref 150–400)
RBC: 4.13 MIL/uL (ref 3.87–5.11)
RDW: 13.3 % (ref 11.5–15.5)
WBC: 16 10*3/uL — ABNORMAL HIGH (ref 4.0–10.5)
nRBC: 0 % (ref 0.0–0.2)

## 2023-04-27 LAB — BASIC METABOLIC PANEL
Anion gap: 8 (ref 5–15)
BUN: 15 mg/dL (ref 8–23)
CO2: 26 mmol/L (ref 22–32)
Calcium: 8.5 mg/dL — ABNORMAL LOW (ref 8.9–10.3)
Chloride: 103 mmol/L (ref 98–111)
Creatinine, Ser: 1.16 mg/dL — ABNORMAL HIGH (ref 0.44–1.00)
GFR, Estimated: 50 mL/min — ABNORMAL LOW (ref >60)
Glucose, Bld: 131 mg/dL — ABNORMAL HIGH (ref 70–99)
Potassium: 3 mmol/L — ABNORMAL LOW (ref 3.5–5.1)
Sodium: 137 mmol/L (ref 135–145)

## 2023-04-27 LAB — CULTURE, BLOOD (ROUTINE X 2): Culture: NO GROWTH

## 2023-04-27 LAB — URINE CULTURE

## 2023-04-27 LAB — MYCOPLASMA PNEUMONIAE ANTIBODY, IGM: Mycoplasma pneumo IgM: <770 U/mL (ref 0–769)

## 2023-04-27 MED ORDER — CARVEDILOL 6.25 MG PO TABS: 6.2500 mg | ORAL_TABLET | Freq: Two times a day (BID) | ORAL | Status: DC

## 2023-04-27 MED ORDER — AMLODIPINE BESYLATE 10 MG PO TABS
10.0000 mg | ORAL_TABLET | Freq: Every evening | ORAL | Status: DC
  Administered 2023-04-28 – 2023-05-06 (×9): 10 mg via ORAL
  Filled 2023-04-27 (×9): qty 1

## 2023-04-27 MED ORDER — LACTATED RINGERS IV SOLN: INTRAVENOUS | Status: DC

## 2023-04-27 MED ORDER — POTASSIUM CHLORIDE CRYS ER 20 MEQ PO TBCR
40.0000 meq | EXTENDED_RELEASE_TABLET | ORAL | Status: AC
  Administered 2023-04-27 (×2): 40 meq via ORAL
  Filled 2023-04-27 (×2): qty 2

## 2023-04-27 MED ORDER — AMLODIPINE BESYLATE 5 MG PO TABS: 5.0000 mg | ORAL_TABLET | Freq: Every day | ORAL | Status: DC

## 2023-04-27 MED ORDER — CARVEDILOL 12.5 MG PO TABS
12.5000 mg | ORAL_TABLET | Freq: Two times a day (BID) | ORAL | Status: DC
  Administered 2023-04-27 – 2023-05-07 (×21): 12.5 mg via ORAL
  Filled 2023-04-27 (×21): qty 1

## 2023-04-27 NOTE — Hospital Course (Signed)
[name] is a 72 y.o. female with medical history significant of poorly controlled COPD, PVD with chronic right big toe ulcer, recurrent UTIs, kidney stones, HTN, brought in by family member for evaluation of worsening of cough shortness of breath and admitted with presumed sepsis secondary to community-acquired pneumonia and UTI.  Discussed with ID originally recommended 7 days of IV antibiotics.  After discussion with urology current recommendation is for 14 days of IV antibiotics due to right renal stone with hydronephrosis.

## 2023-04-27 NOTE — Progress Notes (Signed)
[facility] Progress Note Patient: [name] HYQ:[id number] DOB: [birth year] DOA: 04/25/2023  DOS: the patient was seen and examined on 04/27/2023  Brief hospital course: [name] is a 72 y.o. female with medical history significant of poorly controlled COPD, PVD with chronic right big toe ulcer, recurrent UTIs, kidney stones, HTN, brought in by family member for evaluation of worsening of cough shortness of breath and admitted with presumed sepsis secondary to community-acquired pneumonia and UTI.  Assessment and Plan: Sepsis secondary to presumed aspiration pneumonia as well as UTI with ESBL E. coli bacteremia, POA:  Patient met criteria for SIRS based on fever of 102.7 and tachypnea.   her blood culture is now growing ESBL E. coli, she was initially on cefepime which was transitioned to [place]. Waiting for final sensitivities. No respiratory complaint. Discussed with ID recommending 7 days of IV antibiotics likely switching to ertapenem.   Poorly controlled COPD: She is only on DuoNebs as needed.  Currently no shortness of breath, no wheezing on examination.   History of PVD with chronic nonhealing ulcer right great toe: Appears stable.  family reported that the wound has been taking care of with weekly wound care and the healing process has been satisfying.  Will consult wound care.  Continue aspirin and Plavix.   Essential hypertension: Blood pressure soft.  Continue amlodipine and Coreg.  Due to patient's soft blood pressure recommendation is to reduce amlodipine and Coreg but patient would like to continue the home dose only and threatening to leave AMA otherwise.  AKI. Baseline serum creatinine 0.8.  Currently serum creatinine 1.1 Recommended IV hydration. Patient refused.  Hypokalemia. Potassium 3.0. Recommended 40 mg x 2 potassium.  Will monitor.  Subjective: Patient refusing IV hydration.  Patient also concerned with regards to reduction of her antihypertensive  medication dose.  She wants to leave AMA.  No nausea no vomiting.  Wants to go home as soon as possible.  Physical Exam: General: in Mild distress, No Rash Cardiovascular: S1 and S2 Present, No Murmur Respiratory: Good respiratory effort, Bilateral Air entry present. No Crackles, No wheezes Abdomen: Bowel Sound present, No tenderness Extremities: No edema Neuro: Alert and oriented x3, no new focal deficit  Data Reviewed: I have Reviewed nursing notes, Vitals, and Lab results. Since last encounter, pertinent lab results CBC and BMP   . I have ordered test including CBC and BMP  . I have discussed pt's care plan and test results with ID  .   Disposition: Status is: Inpatient Remains inpatient appropriate because: Need for IV antibiotics awaiting culture clearance  enoxaparin (LOVENOX) injection 40 mg Start: 04/25/23 1545   Family Communication: No one at bedside Level of care: Med-Surg   Vitals:   04/27/23 0507 04/27/23 0819 04/27/23 0918 04/27/23 1656  BP: 123/76  (!) 121/54 133/71  Pulse: 68  65 (!) 59  Resp:   18 18  Temp: 98.7 F (37.1 C)  (!) 97.4 F (36.3 C) 98.1 F (36.7 C)  TempSrc: Oral  Oral   SpO2: 96% 96% 93% 96%  Weight:      Height:         Author: Lynden Oxford, MD 04/27/2023 7:46 PM  Please look on [email or web address] to find out who is on call.

## 2023-04-28 ENCOUNTER — Ambulatory Visit: Payer: Medicare Other | Admitting: Physician Assistant

## 2023-04-28 ENCOUNTER — Other Ambulatory Visit: Payer: Self-pay

## 2023-04-28 DIAGNOSIS — A419 Sepsis, unspecified organism: Secondary | ICD-10-CM | POA: Diagnosis not present

## 2023-04-28 LAB — CBC WITH DIFFERENTIAL/PLATELET
Abs Immature Granulocytes: 0.07 10*3/uL (ref 0.00–0.07)
Basophils Absolute: 0 10*3/uL (ref 0.0–0.1)
Basophils Relative: 0 %
Eosinophils Absolute: 0.2 10*3/uL (ref 0.0–0.5)
Eosinophils Relative: 2 %
HCT: 39.6 % (ref 36.0–46.0)
Hemoglobin: 12.7 g/dL (ref 12.0–15.0)
Immature Granulocytes: 1 %
Lymphocytes Relative: 16 %
Lymphs Abs: 1.5 10*3/uL (ref 0.7–4.0)
MCH: 25.7 pg — ABNORMAL LOW (ref 26.0–34.0)
MCHC: 32.1 g/dL (ref 30.0–36.0)
MCV: 80.2 fL (ref 80.0–100.0)
Monocytes Absolute: 0.6 10*3/uL (ref 0.1–1.0)
Monocytes Relative: 6 %
Neutro Abs: 7.2 10*3/uL (ref 1.7–7.7)
Neutrophils Relative %: 75 %
Platelets: 359 10*3/uL (ref 150–400)
RBC: 4.94 MIL/uL (ref 3.87–5.11)
RDW: 13.6 % (ref 11.5–15.5)
WBC: 9.5 10*3/uL (ref 4.0–10.5)
nRBC: 0 % (ref 0.0–0.2)

## 2023-04-28 LAB — LEGIONELLA PNEUMOPHILA SEROGP 1 UR AG: L. pneumophila Serogp 1 Ur Ag: NEGATIVE

## 2023-04-28 LAB — BASIC METABOLIC PANEL
Anion gap: 11 (ref 5–15)
BUN: 15 mg/dL (ref 8–23)
CO2: 23 mmol/L (ref 22–32)
Calcium: 8.7 mg/dL — ABNORMAL LOW (ref 8.9–10.3)
Chloride: 96 mmol/L — ABNORMAL LOW (ref 98–111)
Creatinine, Ser: 0.87 mg/dL (ref 0.44–1.00)
GFR, Estimated: >60 mL/min (ref >60)
Glucose, Bld: 138 mg/dL — ABNORMAL HIGH (ref 70–99)
Potassium: 3.5 mmol/L (ref 3.5–5.1)
Sodium: 130 mmol/L — ABNORMAL LOW (ref 135–145)

## 2023-04-28 LAB — CULTURE, BLOOD (ROUTINE X 2)

## 2023-04-28 LAB — MAGNESIUM: Magnesium: 1.8 mg/dL (ref 1.7–2.4)

## 2023-04-28 MED ORDER — SODIUM CHLORIDE 0.9 % IV SOLN
1.0000 g | INTRAVENOUS | Status: DC
  Administered 2023-04-28 – 2023-05-07 (×10): 1000 mg via INTRAVENOUS
  Filled 2023-04-28 (×11): qty 1

## 2023-04-28 MED ORDER — SODIUM CHLORIDE 0.9 % IV SOLN
1.0000 g | Freq: Three times a day (TID) | INTRAVENOUS | Status: DC
  Filled 2023-04-28: qty 20

## 2023-04-28 MED ORDER — LOPERAMIDE HCL 2 MG PO CAPS
2.0000 mg | ORAL_CAPSULE | ORAL | Status: DC | PRN
  Administered 2023-04-28 – 2023-05-05 (×4): 2 mg via ORAL
  Filled 2023-04-28 (×4): qty 1

## 2023-04-28 MED ORDER — BENZONATATE 100 MG PO CAPS
100.0000 mg | ORAL_CAPSULE | Freq: Three times a day (TID) | ORAL | Status: DC
  Administered 2023-04-28 – 2023-04-30 (×6): 100 mg via ORAL
  Filled 2023-04-28 (×8): qty 1

## 2023-04-28 MED ORDER — SACCHAROMYCES BOULARDII 250 MG PO CAPS
250.0000 mg | ORAL_CAPSULE | Freq: Two times a day (BID) | ORAL | Status: DC
  Administered 2023-04-28 – 2023-05-07 (×19): 250 mg via ORAL
  Filled 2023-04-28 (×19): qty 1

## 2023-04-28 NOTE — Progress Notes (Signed)
PHARMACY CONSULT NOTE FOR:  OUTPATIENT  PARENTERAL ANTIBIOTIC THERAPY (OPAT)  Indication: ESBL bacteremia, UTI, presence of kidney stone Regimen: ertapenem 1g IV q24 hours End date: 05/09/23  IV antibiotic discharge orders are pended. To discharging provider:  please sign these orders via discharge navigator,  Select New Orders & click on the button choice - Manage This Unsigned Work.     Thank you for allowing pharmacy to be a part of this patient's care.  Rexford Maus, PharmD, BCPS 04/28/2023 1:04 PM

## 2023-04-28 NOTE — Progress Notes (Signed)
Mobility Specialist Progress Note   04/28/23 1147  Mobility  Activity Ambulated with assistance in hallway  Level of Assistance Minimal assist, patient does 75% or more  Assistive Device Front wheel walker  Distance Ambulated (ft) 72 ft  Activity Response Tolerated well  Mobility Referral Yes  $Mobility charge 1 Mobility  Mobility Specialist Start Time (ACUTE ONLY) 1110  Mobility Specialist Stop Time (ACUTE ONLY) 1141  Mobility Specialist Time Calculation (min) (ACUTE ONLY) 31 min   Pre Mobility: 64 HR, 94% SpO2 on RA During Mobility: 78 HR, 97% SpO2 on RA Post Mobility: 63 HR, 96% SpO2 on RA  Received pt in bed having no complaints and agreeable. MinA to EOB d/t trunk weakness. Pt having sudden urge for a BM and assisted to [facility] via HHA and furniture surfing. NT assisted w/ pericare before hallway ambulation. X1 seated rest break d/t increased fatigue but SpO2 remaining >94% throughout session. Returned back to chair w/o fault and call bell in reach.   Frederico Hamman Mobility Specialist Please contact via SecureChat or  Rehab office at [phone]

## 2023-04-28 NOTE — TOC Initial Note (Addendum)
Transition of Care [facility]) - Initial/Assessment Note    Patient Details  Name: [name] MRN: [id number] Date of Birth: 1950-12-27  Transition of [facility]) CM/SW Contact:    Tom-Johnson, Hershal Coria, RN Phone Number: 04/28/2023, 4:32 PM  Clinical Narrative:                  CM consulted for home IV abx at discharge.  Patient is admitted for UTI, found to have ESBL Ecoli and on IV abx.  CM called in referral to [facility] with [facility] and acceptance voiced. CM called in referral to [facility] for Home health RN and Kandee Keen voiced acceptance. Info on AVS. Plan for PICC or MLC placement prior to discharge. CM informed that patient's daughter has concerns about contamination with patient discharging home with ESBL. CM called and spoke with [name] [phone]). [name] voiced her concerns and CM assured her that patient will be discharged to the right disposition. [name] requested patient discharge to [facility] in [place] for the duration of IV abx before returning home. CM notified LCSW about request.  CM will continue to follow as patient progresses with care towards discharge.         Expected Discharge Plan: Home w Home Health Services Barriers to Discharge: Continued Medical Work up   Patient Goals and CMS Choice Patient states their goals for this hospitalization and ongoing recovery are:: To return home CMS Medicare.gov Compare Post Acute Care list provided to:: Patient Choice offered to / list presented to : Patient      Expected Discharge Plan and Services   Discharge Planning Services: CM Consult   Living arrangements for the past 2 months: Single Family Home                 DME Arranged: N/A DME Agency: NA       HH Arranged: RN, IV Antibiotics HH Agency: [facility], [facility] Date [facility] Agency Contacted: 04/28/23 Time HH Agency Contacted: 1230 Representative spoke with at [facility] Agency: Pam with [facility] for IV abx and Kandee Keen with [facility]  [employer].  Prior Living Arrangements/Services Living arrangements for the past 2 months: Single Family Home Lives with:: Self Patient language and need for interpreter reviewed:: Yes Do you feel safe going back to the place where you live?: Yes      Need for Family Participation in Patient Care: Yes (Comment) Care giver support system in place?: Yes (comment)   Criminal Activity/Legal Involvement Pertinent to Current Situation/Hospitalization: No - Comment as needed  Activities of Daily Living Home Assistive Devices/Equipment: Wheelchair, Environmental consultant (specify type) ADL Screening (condition at time of admission) Patient's cognitive ability adequate to safely complete daily activities?: Yes Is the patient deaf or have difficulty hearing?: No Does the patient have difficulty seeing, even when wearing glasses/contacts?: No Does the patient have difficulty concentrating, remembering, or making decisions?: No Patient able to express need for assistance with ADLs?: Yes Does the patient have difficulty dressing or bathing?: Yes Independently performs ADLs?: No Communication: Independent Dressing (OT): Needs assistance Is this a change from baseline?: Pre-admission baseline Grooming: Needs assistance Is this a change from baseline?: Pre-admission baseline Feeding: Independent Is this a change from baseline?: Pre-admission baseline Bathing: Needs assistance Is this a change from baseline?: Pre-admission baseline Toileting: Dependent Is this a change from baseline?: Pre-admission baseline In/Out Bed: Needs assistance Is this a change from baseline?: Pre-admission baseline Walks in Home: Needs assistance Is this a change from baseline?: Pre-admission baseline Does the patient have  difficulty walking or climbing stairs?: Yes Weakness of Legs: Both Weakness of Arms/Hands: None  Permission Sought/Granted Permission sought to share information with : Case Manager, Magazine features editor,  Family Supports Permission granted to share information with : Yes, Verbal Permission Granted              Emotional Assessment Appearance:: Appears stated age Attitude/Demeanor/Rapport: Engaged, Gracious Affect (typically observed): Accepting, Appropriate, Calm, Hopeful, Pleasant Orientation: : Oriented to Self, Oriented to Place, Oriented to  Time, Oriented to Situation Alcohol / Substance Use: Not Applicable Psych Involvement: No (comment)  Admission diagnosis:  UTI (urinary tract infection) [N39.0] Urinary tract infection with hematuria, site unspecified [N39.0, R31.9] Pneumonia of both lungs due to infectious organism, unspecified part of lung [J18.9] Sepsis without acute organ dysfunction, due to unspecified organism (HCC) [A41.9] E coli bacteremia [R78.81, B96.20] Patient Active Problem List   Diagnosis Date Noted   E coli bacteremia 04/26/2023   UTI (urinary tract infection) 04/25/2023   Acute encephalopathy 04/25/2023   PVD (peripheral vascular disease) (HCC) 01/07/2023   Peripheral neuropathy 01/07/2023   Cellulitis of great toe of right foot 01/06/2023   PCP:  Patient, No Pcp Per Pharmacy:   [facility] [address] [facility] - [place], [place] - [address] AT [facility] [address] [place] [address] Phone: [phone] Fax: [phone]     Social Determinants of Health (SDOH) Social History: SDOH Screenings   Food Insecurity: No Food Insecurity (04/25/2023)  Housing: Low Risk  (04/25/2023)  Transportation Needs: No Transportation Needs (04/25/2023)  Utilities: Not At Risk (04/25/2023)  Tobacco Use: Medium Risk (04/25/2023)   SDOH Interventions: Transportation Interventions: Intervention Not Indicated, Patient Resources (Friends/Family), Inpatient TOC   Readmission Risk Interventions    04/28/2023    4:20 PM  Readmission Risk Prevention Plan  Post Dischage Appt Complete  Medication Screening Complete  Transportation Screening Complete

## 2023-04-28 NOTE — Progress Notes (Signed)
[facility] Progress Note Patient: [name] ZOX:[id number] DOB: [birth year] DOA: 04/25/2023  DOS: the patient was seen and examined on 04/28/2023  Brief hospital course: [name] is a 72 y.o. female with medical history significant of poorly controlled COPD, PVD with chronic right big toe ulcer, recurrent UTIs, kidney stones, HTN, brought in by family member for evaluation of worsening of cough shortness of breath and admitted with presumed sepsis secondary to community-acquired pneumonia and UTI.  Discussed with ID originally recommended 7 days of IV antibiotics.  After discussion with urology current recommendation is for 14 days of IV antibiotics due to right renal stone with hydronephrosis. Assessment and Plan: Sepsis secondary to presumed aspiration pneumonia as well as UTI with ESBL E. coli bacteremia, POA:  2.2 cm nephrolithiasis and right renal pelvis with mild upstream hydronephrosis Patient met criteria for SIRS based on fever of 102.7 and tachypnea.   her blood culture is now growing ESBL E. coli, she was initially on cefepime which was transitioned to [place]. Reports more cough on 5/7 although does not require any oxygen even on exertion.  Bilateral exam negative for significant crackles as well. Able to tolerate oral diet. Discussed with ID as well as urology. Current recommendation is for 14 days of IV antibiotics secondary to presence of the kidney stone with hydronephrosis. Currently improving with IV antibiotics.  Leukocytosis resolved.  Patient had evaluated by [facility] urology in the past 6 years ago for the same presentation at which time she was treated with 3 weeks of IV antibiotics and then was sent to SNF.  Per conversation between patient and the urologist back then recommendation was not to treat the stone.  Patient currently does not want lithotripsy.  COPD: No evidence of exacerbation. She is only on albuterol nebulizer as needed. Dulera added.  Continue  albuterol nebulization as needed.  Patient prefers to use that on a scheduled basis.   History of PVD with chronic nonhealing ulcer right great toe:  Appears stable.   family reported that the wound has been taking care of with weekly wound care and the healing process has been satisfying.   Continue aspirin and Plavix.   Essential hypertension: Blood pressure soft.   Continue amlodipine and Coreg per home dose.  Patient would like to continue exactly the same home dose without any modification.  AKI. Baseline serum creatinine 0.8.  Currently serum creatinine 1.1 Recommended IV hydration. Patient refused.  Renal function improving.  Hypokalemia. Improving.  Disposition. Multiple prolonged conversation with patient as well as daughter. Pt will need Antibiotics for 14 days total.  Responding well to current regimen.  Informed the daughter that generally E. Coli infection does not require repeat cultures as long as the patient is responding well to the antibiotics. Patient initially refused midline placement. After discussion with the patient currently agreeable for PICC line.  I ordered PICC line but midline would also probably be adequate for the duration of the antibiotic that the patient needs. Daughter is resistant to the idea of patient leaving the hospital. I have told her that as of based on the exam and available information on 5/7, the pt is medically stable and once we have a safe discharge plan she will be discharged. Given that she has skilled nursing care needs with IV Antibiotics, I have offered SNF as an option. Pt was ok with that and daughter would like to talk with her family before making a decision. PT evaluation is reassuring. Urology will follow-up with the  patient on 5/8.  Currently recommending CT scan stone protocol.  Recommending consideration for lithotripsy.  Antibiotic induced diarrhea. No abdominal tenderness. No nausea no vomiting. As needed Imodium.  Add  probiotics.  Subjective: Reports diarrhea.  No nausea no vomiting.  No abdominal pain.  No fever no chills.  Reports more cough today compared to yesterday.  Physical Exam: General: in Mild distress, No Rash Cardiovascular: S1 and S2 Present, No Murmur Respiratory: Good respiratory effort, Bilateral Air entry present. No Crackles, No wheezes Abdomen: Bowel Sound present, No tenderness Extremities: No edema Neuro: Alert and oriented x3, no new focal deficit   Data Reviewed: I have Reviewed nursing notes, Vitals, and Lab results. Since last encounter, pertinent lab results CBC and BMP   . I have ordered test including CBC and BMP  . I have discussed pt's care plan and test results with urology  .    Disposition: Status is: Inpatient Remains inpatient appropriate because: Need for IV antibiotics.  Medically stable.  Awaiting family to make decisions with regards to disposition.  enoxaparin (LOVENOX) injection 40 mg Start: 04/25/23 1545   Family Communication: No one at bedside Level of care: Med-Surg   Vitals:   04/28/23 0555 04/28/23 0738 04/28/23 0846 04/28/23 1609  BP: 135/65  121/65 125/73  Pulse: 69  76 69  Resp: 20  18 18   Temp: 97.9 F (36.6 C)  98 F (36.7 C) 98.2 F (36.8 C)  TempSrc: Oral  Oral   SpO2: 94% 94% 93% 96%  Weight:      Height:         Author: Lynden Oxford, MD 04/28/2023 6:59 PM  Please look on [email or web address] to find out who is on call.

## 2023-04-28 NOTE — Evaluation (Signed)
Physical Therapy Evaluation Patient Details Name: [name] MRN: [id number] DOB: 02/19/1951 Today's Date: 04/28/2023  History of Present Illness  72 y.o. female presents to [facility] hospital on 04/25/2023 with UTI and presumed aspiration PNA. PMH includes COPD, PVD, HTN, kidney stones.  Clinical Impression  Pt presents to PT with deficits in functional mobility, gait, power, endurance. Pt is able to ambulate for household distances with support of DME at this time. Pt transfers without physical assistance. PT encourages frequent mobilization to improve strength and to aide in a return to ambulation without DME. PT will continue to follow during this admission. PT does not anticipate any post-acute PT needs.       Recommendations for follow up therapy are one component of a multi-disciplinary discharge planning process, led by the attending physician.  Recommendations may be updated based on patient status, additional functional criteria and insurance authorization.  Follow Up Recommendations       Assistance Recommended at Discharge PRN  Patient can return home with the following  A little help with bathing/dressing/bathroom;Assistance with cooking/housework;Assist for transportation;Help with stairs or ramp for entrance    Equipment Recommendations None recommended by PT  Recommendations for Other Services       Functional Status Assessment Patient has had a recent decline in their functional status and demonstrates the ability to make significant improvements in function in a reasonable and predictable amount of time.     Precautions / Restrictions Precautions Precautions: Fall Restrictions Weight Bearing Restrictions: No      Mobility  Bed Mobility Overal bed mobility: Modified Independent             General bed mobility comments: HOB elevated, increased time    Transfers Overall transfer level: Needs assistance Equipment used: Rolling walker (2 wheels) Transfers: Sit  to/from Stand Sit to Stand: Supervision                Ambulation/Gait Ambulation/Gait assistance: Supervision Gait Distance (Feet): 200 Feet Assistive device: Rolling walker (2 wheels) Gait Pattern/deviations: Step-through pattern Gait velocity: reduced Gait velocity interpretation: 1.31 - 2.62 ft/sec, indicative of limited community ambulator   General Gait Details: slowed step-through gait, increased trunk flexion  Stairs            Wheelchair Mobility    Modified Rankin (Stroke Patients Only)       Balance Overall balance assessment: Needs assistance Sitting-balance support: No upper extremity supported, Feet supported Sitting balance-Leahy Scale: Good     Standing balance support: Single extremity supported, Reliant on assistive device for balance Standing balance-Leahy Scale: Poor                               Pertinent Vitals/Pain Pain Assessment Pain Assessment: No/denies pain    Home Living Family/patient expects to be discharged to:: Private residence Living Arrangements: Children;Other relatives Available Help at Discharge: Family;Available 24 hours/day Type of Home: House Home Access: Stairs to enter Entrance Stairs-Rails: Right Entrance Stairs-Number of Steps: 8   Home Layout: One level Home Equipment: Agricultural consultant (2 wheels);Wheelchair - manual      Prior Function Prior Level of Function : Needs assist             Mobility Comments: ambulates with RW for household distances, wheelchair to leave the home for doctors appointments, grandson assists with stairs ADLs Comments: pt has supervision for bathing, assist for IADLs, independent with other ADLs     Hand  Dominance        Extremity/Trunk Assessment   Upper Extremity Assessment Upper Extremity Assessment: Overall WFL for tasks assessed    Lower Extremity Assessment Lower Extremity Assessment: Generalized weakness (pt with LLD, chronic R foot ulcer)     Cervical / Trunk Assessment Cervical / Trunk Assessment: Kyphotic  Communication   Communication: No difficulties  Cognition Arousal/Alertness: Awake/alert Behavior During Therapy: WFL for tasks assessed/performed Overall Cognitive Status: Within Functional Limits for tasks assessed                                          General Comments General comments (skin integrity, edema, etc.): VSS on RA, sats in mid 90s during ambulation    Exercises     Assessment/Plan    PT Assessment Patient needs continued PT services  PT Problem List Decreased strength;Decreased activity tolerance;Decreased balance;Decreased mobility;Cardiopulmonary status limiting activity       PT Treatment Interventions DME instruction;Gait training;Functional mobility training;Therapeutic activities;Therapeutic exercise;Stair training;Balance training;Neuromuscular re-education;Patient/family education    PT Goals (Current goals can be found in the Care Plan section)  Acute Rehab PT Goals Patient Stated Goal: to return home PT Goal Formulation: With patient Time For Goal Achievement: 05/12/23 Potential to Achieve Goals: Good    Frequency Min 3X/week     Co-evaluation               AM-PAC PT "6 Clicks" Mobility  Outcome Measure Help needed turning from your back to your side while in a flat bed without using bedrails?: None Help needed moving from lying on your back to sitting on the side of a flat bed without using bedrails?: None Help needed moving to and from a bed to a chair (including a wheelchair)?: A Little Help needed standing up from a chair using your arms (e.g., wheelchair or bedside chair)?: A Little Help needed to walk in hospital room?: A Little Help needed climbing 3-5 steps with a railing? : A Little 6 Click Score: 20    End of Session   Activity Tolerance: Patient tolerated treatment well Patient left: in bed;with call bell/phone within reach;with bed  alarm set Nurse Communication: Mobility status PT Visit Diagnosis: Other abnormalities of gait and mobility (R26.89);Muscle weakness (generalized) (M62.81)    Time: 1610-9604 PT Time Calculation (min) (ACUTE ONLY): 20 min   Charges:   PT Evaluation $PT Eval Low Complexity: 1 Low          Arlyss Gandy, PT, DPT Acute Rehabilitation Office [phone]   Arlyss Gandy 04/28/2023, 6:01 PM

## 2023-04-28 NOTE — Progress Notes (Signed)
Pt refused Midline; stated" her daughter will not allow her to go home until she's okay". Primary RN at bedside and aware.. She notified MD.

## 2023-04-28 NOTE — Progress Notes (Signed)
Mobility Specialist Progress Note  Nurse requested Mobility Specialist to perform oxygen saturation test with pt which includes removing pt from oxygen both at rest and while ambulating.  Below are the results from that testing.     Patient Saturations on Room Air at Rest = SpO2 94%  Patient Saturations on Room Air while Ambulating = SpO2 97%.    Rested and performed pursed lip breathing for 1 minute with SpO2 at 98%.  Patient Saturations on N/A Liters of oxygen while Ambulating = N/A  At end of testing pt left in room on RA.  Reported results to nurse.   Frederico Hamman Mobility Specialist Please contact via SecureChat or  Rehab office at [phone]

## 2023-04-29 DIAGNOSIS — N3001 Acute cystitis with hematuria: Secondary | ICD-10-CM | POA: Diagnosis not present

## 2023-04-29 DIAGNOSIS — Z87442 Personal history of urinary calculi: Secondary | ICD-10-CM

## 2023-04-29 DIAGNOSIS — A499 Bacterial infection, unspecified: Secondary | ICD-10-CM

## 2023-04-29 DIAGNOSIS — N200 Calculus of kidney: Secondary | ICD-10-CM

## 2023-04-29 LAB — BASIC METABOLIC PANEL
Anion gap: 8 (ref 5–15)
BUN: 18 mg/dL (ref 8–23)
CO2: 22 mmol/L (ref 22–32)
Calcium: 8.8 mg/dL — ABNORMAL LOW (ref 8.9–10.3)
Chloride: 104 mmol/L (ref 98–111)
Creatinine, Ser: 0.87 mg/dL (ref 0.44–1.00)
GFR, Estimated: >60 mL/min (ref >60)
Glucose, Bld: 118 mg/dL — ABNORMAL HIGH (ref 70–99)
Potassium: 3.7 mmol/L (ref 3.5–5.1)
Sodium: 134 mmol/L — ABNORMAL LOW (ref 135–145)

## 2023-04-29 LAB — CBC WITH DIFFERENTIAL/PLATELET
Abs Immature Granulocytes: 0.09 10*3/uL — ABNORMAL HIGH (ref 0.00–0.07)
Basophils Absolute: 0.1 10*3/uL (ref 0.0–0.1)
Basophils Relative: 1 %
Eosinophils Absolute: 0.2 10*3/uL (ref 0.0–0.5)
Eosinophils Relative: 3 %
HCT: 39.1 % (ref 36.0–46.0)
Hemoglobin: 13.1 g/dL (ref 12.0–15.0)
Immature Granulocytes: 1 %
Lymphocytes Relative: 21 %
Lymphs Abs: 1.8 10*3/uL (ref 0.7–4.0)
MCH: 26.4 pg (ref 26.0–34.0)
MCHC: 33.5 g/dL (ref 30.0–36.0)
MCV: 78.8 fL — ABNORMAL LOW (ref 80.0–100.0)
Monocytes Absolute: 0.7 10*3/uL (ref 0.1–1.0)
Monocytes Relative: 8 %
Neutro Abs: 5.5 10*3/uL (ref 1.7–7.7)
Neutrophils Relative %: 66 %
Platelets: 349 10*3/uL (ref 150–400)
RBC: 4.96 MIL/uL (ref 3.87–5.11)
RDW: 13.5 % (ref 11.5–15.5)
WBC: 8.3 10*3/uL (ref 4.0–10.5)
nRBC: 0 % (ref 0.0–0.2)

## 2023-04-29 LAB — CULTURE, BLOOD (ROUTINE X 2): Special Requests: ADEQUATE

## 2023-04-29 LAB — MAGNESIUM: Magnesium: 2 mg/dL (ref 1.7–2.4)

## 2023-04-29 MED ORDER — SODIUM CHLORIDE 0.9% FLUSH: 10.0000 mL | INTRAVENOUS | Status: DC | PRN

## 2023-04-29 MED ORDER — SODIUM CHLORIDE 0.9% FLUSH
10.0000 mL | Freq: Two times a day (BID) | INTRAVENOUS | Status: DC
  Administered 2023-04-30 – 2023-05-01 (×4): 10 mL
  Administered 2023-05-02 – 2023-05-03 (×2): 30 mL
  Administered 2023-05-03 – 2023-05-04 (×2): 10 mL
  Administered 2023-05-04: 30 mL
  Administered 2023-05-05 – 2023-05-07 (×5): 10 mL

## 2023-04-29 MED ORDER — CHLORHEXIDINE GLUCONATE CLOTH 2 % EX PADS
6.0000 | MEDICATED_PAD | Freq: Every day | CUTANEOUS | Status: DC
  Administered 2023-04-29 – 2023-05-07 (×9): 6 via TOPICAL

## 2023-04-29 NOTE — Progress Notes (Signed)
RN discussed via secure chat placement of IV vs PICC. Pt has PICC orders from 5/7 but had previously refused placement. PICC RN and primary RN at bedside with pt to discuss peripheal IV vs PICC. Pt felt that having a PICC would be more beneficial but she felt she needed to discuss with her daughter. After phone call with daughter, pt agreeable to PICC placement.

## 2023-04-29 NOTE — Consult Note (Signed)
Urology Consult  Referring physician: Dr. Jacqulyn Bath Reason for referral: UTI and nephrolithiasis  Chief Complaint: UTI and nephrolithiasis.  History of Present Illness: [name] is a 72 year old female presents to [facility] emergency department with complaint of shortness of breath, frequent loose bowel movements, N/V, and dry cough.  Urinalysis was consistent with ESBL UTI and renal ultrasound showed evidence of large renal stone with associated mild hydronephrosis.  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  She reports knowing of the stone for at least 7 or 8 years.  She declines treatment or further workup at this time.  Stating, that she just does not want to tempt fate.  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   CAD (coronary artery disease)    COPD (chronic obstructive pulmonary disease) (HCC)    Frequent UTI    Kidney stones    Past Surgical History:  Procedure Laterality Date   ANKLE FUSION Right 2017   fibular graft  Bilateral    bilater fibular missing and replaced in hips   HIP FRACTURE SURGERY Bilateral    LOWER EXTREMITY ANGIOGRAPHY Right 01/09/2023   Procedure: Lower Extremity Angiography;  Surgeon: Renford Dills, MD;  Location: [facility] INVASIVE CV LAB;  Service: Cardiovascular;  Laterality: Right;   MASTECTOMY, PARTIAL Left 2003   ROTATOR CUFF REPAIR Left    X 2   TONSILLECTOMY     Allergies:  Allergies  Allergen Reactions   Penicillins Rash   Oxycodone    Vicodin [Hydrocodone-Acetaminophen]     Family History  Adopted: Yes   Social History:  reports that she has quit smoking. Her smoking use included cigarettes. She smoked an average of 1 pack per day. She has never used smokeless tobacco. She reports that she does not drink alcohol and does not use drugs.   Physical Exam:  Vital signs in last 24  hours: Temp:  [98.1 F (36.7 C)-98.4 F (36.9 C)] 98.4 F (36.9 C) (05/08 0805) Pulse Rate:  [69-77] 77 (05/08 0805) Resp:  [18] 18 (05/08 0805) BP: (119-137)/(65-96) 119/96 (05/08 0805) SpO2:  [94 %-96 %] 96 % (05/08 0814)  Cardiovascular: Skin warm; not flushed Respiratory: Breaths quiet; no shortness of breath Abdomen: No masses, soft, no guarding or rebound Neurological: Normal sensation to touch Musculoskeletal: Normal motor function arms and legs Skin: No rashes   Laboratory Data:  Results for orders placed or performed during the hospital encounter of 04/25/23 (from the past 72 hour(s))  CBC with Differential/Platelet     Status: Abnormal   Collection Time: 04/27/23  1:13 AM  Result Value Ref Range   WBC 16.0 (H) 4.0 - 10.5 K/uL   RBC 4.13 3.87 - 5.11 MIL/uL   Hemoglobin 10.7 (L) 12.0 - 15.0 g/dL   HCT 40.9 (L) 81.1 - 91.4 %   MCV 81.6 80.0 - 100.0 fL   MCH 25.9 (L) 26.0 - 34.0 pg   MCHC 31.8 30.0 - 36.0 g/dL   RDW 78.2 95.6 - 21.3 %   Platelets 315 150 - 400 K/uL   nRBC 0.0 0.0 - 0.2 %   Neutrophils Relative % 80 %   Neutro Abs 12.7 (H) 1.7 - 7.7 K/uL   Lymphocytes Relative 12 %   Lymphs Abs 2.0 0.7 - 4.0 K/uL   Monocytes  Relative 7 %   Monocytes Absolute 1.1 (H) 0.1 - 1.0 K/uL   Eosinophils Relative 0 %   Eosinophils Absolute 0.1 0.0 - 0.5 K/uL   Basophils Relative 0 %   Basophils Absolute 0.0 0.0 - 0.1 K/uL   Immature Granulocytes 1 %   Abs Immature Granulocytes 0.08 (H) 0.00 - 0.07 K/uL    Comment: Performed at [facility], [address] [place], [place] [address]  Basic metabolic panel     Status: Abnormal   Collection Time: 04/27/23  1:13 AM  Result Value Ref Range   Sodium 137 135 - 145 mmol/L   Potassium 3.0 (L) 3.5 - 5.1 mmol/L   Chloride 103 98 - 111 mmol/L   CO2 26 22 - 32 mmol/L   Glucose, Bld 131 (H) 70 - 99 mg/dL    Comment: Glucose reference range applies only to samples taken after fasting for at least 8 hours.   BUN 15 8 - 23  mg/dL   Creatinine, Ser 1.91 (H) 0.44 - 1.00 mg/dL   Calcium 8.5 (L) 8.9 - 10.3 mg/dL   GFR, Estimated 50 (L) >60 mL/min    Comment: (NOTE) Calculated using the CKD-EPI Creatinine Equation (2021)    Anion gap 8 5 - 15    Comment: Performed at [facility], [address], [place], [place] [address]  CBC with Differential/Platelet     Status: Abnormal   Collection Time: 04/28/23  8:50 AM  Result Value Ref Range   WBC 9.5 4.0 - 10.5 K/uL   RBC 4.94 3.87 - 5.11 MIL/uL   Hemoglobin 12.7 12.0 - 15.0 g/dL   HCT 56.2 13.0 - 86.5 %   MCV 80.2 80.0 - 100.0 fL   MCH 25.7 (L) 26.0 - 34.0 pg   MCHC 32.1 30.0 - 36.0 g/dL   RDW 78.4 69.6 - 29.5 %   Platelets 359 150 - 400 K/uL   nRBC 0.0 0.0 - 0.2 %   Neutrophils Relative % 75 %   Neutro Abs 7.2 1.7 - 7.7 K/uL   Lymphocytes Relative 16 %   Lymphs Abs 1.5 0.7 - 4.0 K/uL   Monocytes Relative 6 %   Monocytes Absolute 0.6 0.1 - 1.0 K/uL   Eosinophils Relative 2 %   Eosinophils Absolute 0.2 0.0 - 0.5 K/uL   Basophils Relative 0 %   Basophils Absolute 0.0 0.0 - 0.1 K/uL   Immature Granulocytes 1 %   Abs Immature Granulocytes 0.07 0.00 - 0.07 K/uL    Comment: Performed at [facility], [address] [place], [place] [address]  Basic metabolic panel     Status: Abnormal   Collection Time: 04/28/23  8:50 AM  Result Value Ref Range   Sodium 130 (L) 135 - 145 mmol/L   Potassium 3.5 3.5 - 5.1 mmol/L   Chloride 96 (L) 98 - 111 mmol/L   CO2 23 22 - 32 mmol/L   Glucose, Bld 138 (H) 70 - 99 mg/dL    Comment: Glucose reference range applies only to samples taken after fasting for at least 8 hours.   BUN 15 8 - 23 mg/dL   Creatinine, Ser 2.44 0.44 - 1.00 mg/dL   Calcium 8.7 (L) 8.9 - 10.3 mg/dL   GFR, Estimated >01 >02 mL/min    Comment: (NOTE) Calculated using the CKD-EPI Creatinine Equation (2021)    Anion gap 11 5 - 15    Comment: Performed at [facility], [address] [place], [place] [address]  Magnesium     Status:  None   Collection Time: 04/28/23  8:50 AM  Result Value Ref Range   Magnesium 1.8 1.7 - 2.4 mg/dL    Comment: Performed at [facility], [address] [place], [place] [address]  CBC with Differential/Platelet     Status: Abnormal   Collection Time: 04/29/23  2:26 AM  Result Value Ref Range   WBC 8.3 4.0 - 10.5 K/uL   RBC 4.96 3.87 - 5.11 MIL/uL   Hemoglobin 13.1 12.0 - 15.0 g/dL   HCT 29.5 62.1 - 30.8 %   MCV 78.8 (L) 80.0 - 100.0 fL   MCH 26.4 26.0 - 34.0 pg   MCHC 33.5 30.0 - 36.0 g/dL   RDW 65.7 84.6 - 96.2 %   Platelets 349 150 - 400 K/uL   nRBC 0.0 0.0 - 0.2 %   Neutrophils Relative % 66 %   Neutro Abs 5.5 1.7 - 7.7 K/uL   Lymphocytes Relative 21 %   Lymphs Abs 1.8 0.7 - 4.0 K/uL   Monocytes Relative 8 %   Monocytes Absolute 0.7 0.1 - 1.0 K/uL   Eosinophils Relative 3 %   Eosinophils Absolute 0.2 0.0 - 0.5 K/uL   Basophils Relative 1 %   Basophils Absolute 0.1 0.0 - 0.1 K/uL   Immature Granulocytes 1 %   Abs Immature Granulocytes 0.09 (H) 0.00 - 0.07 K/uL    Comment: Performed at [facility], [address] [place], [place] [address]  Basic metabolic panel     Status: Abnormal   Collection Time: 04/29/23  2:26 AM  Result Value Ref Range   Sodium 134 (L) 135 - 145 mmol/L   Potassium 3.7 3.5 - 5.1 mmol/L   Chloride 104 98 - 111 mmol/L   CO2 22 22 - 32 mmol/L   Glucose, Bld 118 (H) 70 - 99 mg/dL    Comment: Glucose reference range applies only to samples taken after fasting for at least 8 hours.   BUN 18 8 - 23 mg/dL   Creatinine, Ser 1.32 0.44 - 1.00 mg/dL   Calcium 8.8 (L) 8.9 - 10.3 mg/dL   GFR, Estimated >44 >01 mL/min    Comment: (NOTE) Calculated using the CKD-EPI Creatinine Equation (2021)    Anion gap 8 5 - 15    Comment: Performed at [facility], [address], [place], [place] [address]  Magnesium     Status: None   Collection Time: 04/29/23  2:26 AM  Result Value Ref Range   Magnesium 2.0 1.7 - 2.4 mg/dL    Comment: Performed  at [facility], [address], [place], [place] [address]   Recent Results (from the past 240 hour(s))  MRSA Next Gen by PCR, Nasal     Status: None   Collection Time: 04/25/23 11:46 AM   Specimen: Nasal Mucosa; Nasal Swab  Result Value Ref Range Status   MRSA by PCR Next Gen NOT DETECTED NOT DETECTED Final    Comment: (NOTE) The GeneXpert MRSA Assay (FDA approved for NASAL specimens only), is one component of a comprehensive MRSA colonization surveillance program. It is not intended to diagnose MRSA infection nor to guide or monitor treatment for MRSA infections. Test performance is not FDA approved in patients less than 13 years old. Performed at [facility], [address] [place], [place] [address]   Blood Culture (routine x 2)  Status: Abnormal   Collection Time: 04/25/23 11:55 AM   Specimen: BLOOD  Result Value Ref Range Status   Specimen Description BLOOD LEFT ANTECUBITAL  Final   Special Requests   Final    BOTTLES DRAWN AEROBIC AND ANAEROBIC Blood Culture results may not be optimal due to an excessive volume of blood received in culture bottles   Culture  Setup Time   Final    GRAM NEGATIVE RODS IN BOTH AEROBIC AND ANAEROBIC BOTTLES CRITICAL RESULT CALLED TO, READ BACK BY AND VERIFIED WITH: PHARMD G. ABBOTT 04/26/2023 @ 0227 BY AB Performed at [facility], [address] [place], [place] [address]    Culture (A)  Final    ESCHERICHIA COLI Confirmed Extended Spectrum Beta-Lactamase Producer (ESBL).  In bloodstream infections from ESBL organisms, carbapenems are preferred over piperacillin/tazobactam. They are shown to have a lower risk of mortality.    Report Status 04/28/2023 FINAL  Final   Organism ID, Bacteria ESCHERICHIA COLI  Final      Susceptibility   Escherichia coli - MIC*    AMPICILLIN >=32 RESISTANT Resistant     CEFEPIME 16 RESISTANT Resistant     CEFTAZIDIME RESISTANT Resistant     CEFTRIAXONE >=64 RESISTANT Resistant      CIPROFLOXACIN >=4 RESISTANT Resistant     GENTAMICIN >=16 RESISTANT Resistant     IMIPENEM <=0.25 SENSITIVE Sensitive     TRIMETH/SULFA >=320 RESISTANT Resistant     AMPICILLIN/SULBACTAM >=32 RESISTANT Resistant     PIP/TAZO 16 SENSITIVE Sensitive     * ESCHERICHIA COLI  Blood Culture ID Panel (Reflexed)     Status: Abnormal   Collection Time: 04/25/23 11:55 AM  Result Value Ref Range Status   Enterococcus faecalis NOT DETECTED NOT DETECTED Final   Enterococcus Faecium NOT DETECTED NOT DETECTED Final   Listeria monocytogenes NOT DETECTED NOT DETECTED Final   Staphylococcus species NOT DETECTED NOT DETECTED Final   Staphylococcus aureus (BCID) NOT DETECTED NOT DETECTED Final   Staphylococcus epidermidis NOT DETECTED NOT DETECTED Final   Staphylococcus lugdunensis NOT DETECTED NOT DETECTED Final   Streptococcus species NOT DETECTED NOT DETECTED Final   Streptococcus agalactiae NOT DETECTED NOT DETECTED Final   Streptococcus pneumoniae NOT DETECTED NOT DETECTED Final   Streptococcus pyogenes NOT DETECTED NOT DETECTED Final   A.calcoaceticus-baumannii NOT DETECTED NOT DETECTED Final   Bacteroides fragilis NOT DETECTED NOT DETECTED Final   Enterobacterales DETECTED (A) NOT DETECTED Final    Comment: Enterobacterales represent a large order of gram negative bacteria, not a single organism. CRITICAL RESULT CALLED TO, READ BACK BY AND VERIFIED WITH: PHARMD G. ABBOTT 04/26/2023 @ 0227 BY AB    Enterobacter cloacae complex NOT DETECTED NOT DETECTED Final   Escherichia coli DETECTED (A) NOT DETECTED Final    Comment: CRITICAL RESULT CALLED TO, READ BACK BY AND VERIFIED WITH: PHARMD G. ABBOTT 04/26/2023 @ 0227 BY AB    Klebsiella aerogenes NOT DETECTED NOT DETECTED Final   Klebsiella oxytoca NOT DETECTED NOT DETECTED Final   Klebsiella pneumoniae NOT DETECTED NOT DETECTED Final   Proteus species NOT DETECTED NOT DETECTED Final   Salmonella species NOT DETECTED NOT DETECTED Final   Serratia  marcescens NOT DETECTED NOT DETECTED Final   Haemophilus influenzae NOT DETECTED NOT DETECTED Final   Neisseria meningitidis NOT DETECTED NOT DETECTED Final   Pseudomonas aeruginosa NOT DETECTED NOT DETECTED Final   Stenotrophomonas maltophilia NOT DETECTED NOT DETECTED Final   Candida albicans NOT DETECTED NOT DETECTED Final   Candida  auris NOT DETECTED NOT DETECTED Final   Candida glabrata NOT DETECTED NOT DETECTED Final   Candida krusei NOT DETECTED NOT DETECTED Final   Candida parapsilosis NOT DETECTED NOT DETECTED Final   Candida tropicalis NOT DETECTED NOT DETECTED Final   Cryptococcus neoformans/gattii NOT DETECTED NOT DETECTED Final   CTX-M ESBL DETECTED (A) NOT DETECTED Final    Comment: CRITICAL RESULT CALLED TO, READ BACK BY AND VERIFIED WITH: PHARMD G. ABBOTT 04/26/2023 @ 0227 BY AB (NOTE) Extended spectrum beta-lactamase detected. Recommend a carbapenem as initial therapy.      Carbapenem resistance IMP NOT DETECTED NOT DETECTED Final   Carbapenem resistance KPC NOT DETECTED NOT DETECTED Final   Carbapenem resistance NDM NOT DETECTED NOT DETECTED Final   Carbapenem resist OXA 48 LIKE NOT DETECTED NOT DETECTED Final   Carbapenem resistance VIM NOT DETECTED NOT DETECTED Final    Comment: Performed at [facility], [address], [place], [place] [address]  Blood Culture (routine x 2)     Status: None (Preliminary result)   Collection Time: 04/25/23 12:01 PM   Specimen: BLOOD RIGHT FOREARM  Result Value Ref Range Status   Specimen Description BLOOD RIGHT FOREARM  Final   Special Requests   Final    BOTTLES DRAWN AEROBIC AND ANAEROBIC Blood Culture adequate volume   Culture   Final    NO GROWTH 4 DAYS Performed at [facility], [address], [place], [place] [address]    Report Status PENDING  Incomplete  Urine Culture     Status: Abnormal   Collection Time: 04/25/23  1:00 PM   Specimen: Urine, Random  Result Value Ref Range Status   Specimen  Description URINE, RANDOM  Final   Special Requests   Final    URINE, CATHETERIZED Performed at [facility], [address], [place], [place] [address]    Culture (A)  Final    80,000 COLONIES/mL ESCHERICHIA COLI Confirmed Extended Spectrum Beta-Lactamase Producer (ESBL).  In bloodstream infections from ESBL organisms, carbapenems are preferred over piperacillin/tazobactam. They are shown to have a lower risk of mortality.    Report Status 04/27/2023 FINAL  Final   Organism ID, Bacteria ESCHERICHIA COLI (A)  Final      Susceptibility   Escherichia coli - MIC*    AMPICILLIN >=32 RESISTANT Resistant     CEFAZOLIN >=64 RESISTANT Resistant     CEFEPIME 16 RESISTANT Resistant     CEFTRIAXONE >=64 RESISTANT Resistant     CIPROFLOXACIN >=4 RESISTANT Resistant     GENTAMICIN >=16 RESISTANT Resistant     IMIPENEM <=0.25 SENSITIVE Sensitive     NITROFURANTOIN 256 RESISTANT Resistant     TRIMETH/SULFA >=320 RESISTANT Resistant     AMPICILLIN/SULBACTAM >=32 RESISTANT Resistant     PIP/TAZO 8 SENSITIVE Sensitive     * 80,000 COLONIES/mL ESCHERICHIA COLI   Creatinine: Recent Labs    04/25/23 0950 04/27/23 0113 04/28/23 0850 04/29/23 0226  CREATININE 1.17* 1.16* 0.87 0.87    Imaging: See report/chart  Renal ultrasound suggests presence of stone, which has been there for approximately 7 years per the patient.    Impression/Assessment:  ESBL UTI.    Renal ultrasound is inadequate to properly characterize her renal stone or level of obstruction.  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  She declines further workup or any surgical intervention on the stone.  We reviewed that it would make her recurrent resistant urinary tract infections harder to clear the longer it stayed there as well as the possibility of the stone eventually killing the kidney itself through obstructive process or chronic  parenchymal thinning.  She expressed understanding and said she may consider follow-up within the [facility] system in the future.  Preserve renal function.  SCr-0.87  Urology will sign off.  Please feel free to call with questions, concerns, or if patient changes her mind and is willing to pursue treatment.  Plan:  ABX per primary team  I have strongly encourage patient to follow-up with her urologist at [facility] or [place] if she likes.  She declines treatment at this time but says that she will consider it in the future when she is discharged.  Scherrie Bateman Glynis Hunsucker 04/29/2023, 2:31 PM  Pager: [phone]

## 2023-04-29 NOTE — [place] FL2 (Signed)
[place] MEDICAID FL2 LEVEL OF CARE FORM     IDENTIFICATION  Patient Name: [name] Birthdate: 1951/05/19 Sex: female Admission Date (Current Location): 04/25/2023  [facility] and [place] Number:  Producer, television/film/video and Address:  [facility], [address] [place], [place] [address]      Provider Number: [id number]  Attending Physician Name and Address:  Hughie Closs, MD  Relative Name and Phone Number:  Rayden, Turro (Daughter) [phone]    Current Level of Care: Hospital Recommended Level of Care: Skilled Nursing Facility Prior Approval Number:    Date Approved/Denied:   PASRR Number: [id number]  Discharge Plan: SNF    Current Diagnoses: Patient Active Problem List   Diagnosis Date Noted   E coli bacteremia 04/26/2023   UTI (urinary tract infection) 04/25/2023   Acute encephalopathy 04/25/2023   PVD (peripheral vascular disease) (HCC) 01/07/2023   Peripheral neuropathy 01/07/2023   Cellulitis of great toe of right foot 01/06/2023    Orientation RESPIRATION BLADDER Height & Weight     Self, Time, Situation, Place  Normal Continent Weight: 156 lb 4.9 oz (70.9 kg) Height:  5\' 3"  (160 cm)  BEHAVIORAL SYMPTOMS/MOOD NEUROLOGICAL BOWEL NUTRITION STATUS      Continent Diet (see d/c medication list)  AMBULATORY STATUS COMMUNICATION OF NEEDS Skin   Supervision Verbally PU Stage and Appropriate Care (R. Toe, necrotic tissue)                       Personal Care Assistance Level of Assistance  Bathing, Feeding, Dressing Bathing Assistance: Limited assistance Feeding assistance: Independent Dressing Assistance: Limited assistance     Functional Limitations Info  Speech, Hearing, Sight Sight Info: Adequate Hearing Info: Adequate Speech Info: Adequate    SPECIAL CARE FACTORS FREQUENCY   (IV abx)                    Contractures Contractures Info: Not present    Additional Factors Info  Code Status, Allergies,  Isolation Precautions Code Status Info: Full Allergies Info: Penicillins  Oxycodone  Vicodin (Hydrocodone-acetaminophen)     Isolation Precautions Info: contact for OMDRO and ESBL     Current Medications (04/29/2023):  This is the current hospital active medication list Current Facility-Administered Medications  Medication Dose Route Frequency Provider Last Rate Last Admin   acetaminophen (TYLENOL) tablet 650 mg  650 mg Oral Q6H PRN Hughie Closs, MD   650 mg at 04/28/23 2059   albuterol (PROVENTIL) (2.5 MG/3ML) 0.083% nebulizer solution 2.5 mg  2.5 mg Inhalation Q6H PRN Mikey College T, MD   2.5 mg at 04/29/23 0813   amLODipine (NORVASC) tablet 10 mg  10 mg Oral QPM Rolly Salter, MD   10 mg at 04/28/23 2100   aspirin EC tablet 81 mg  81 mg Oral Daily Mikey College T, MD   81 mg at 04/28/23 0820   benzonatate (TESSALON) capsule 100 mg  100 mg Oral TID Rolly Salter, MD   100 mg at 04/28/23 2100   carvedilol (COREG) tablet 12.5 mg  12.5 mg Oral BID WC Rolly Salter, MD   12.5 mg at 04/28/23 2100   clopidogrel (PLAVIX) tablet 75 mg  75 mg Oral Q breakfast Mikey College T, MD   75 mg at 04/28/23 0820   enoxaparin (LOVENOX) injection 40 mg  40 mg Subcutaneous Q24H Mikey College T, MD   40 mg at 04/26/23 1700   ertapenem (INVANZ) 1,000 mg  in sodium chloride 0.9 % 100 mL IVPB  1 g Intravenous Q24H Rolly Salter, MD 200 mL/hr at 04/28/23 1213 1,000 mg at 04/28/23 1213   guaiFENesin (MUCINEX) 12 hr tablet 1,200 mg  1,200 mg Oral BID Mikey College T, MD   1,200 mg at 04/28/23 2100   loperamide (IMODIUM) capsule 2 mg  2 mg Oral PRN Rolly Salter, MD   2 mg at 04/28/23 1216   loratadine (CLARITIN) tablet 10 mg  10 mg Oral Daily Mikey College T, MD   10 mg at 04/28/23 0820   melatonin tablet 10 mg  10 mg Oral QHS Mikey College T, MD   10 mg at 04/28/23 2100   mometasone-formoterol (DULERA) 200-5 MCG/ACT inhaler 2 puff  2 puff Inhalation BID Mikey College T, MD   2 puff at 04/29/23 0808   ondansetron (ZOFRAN)  tablet 4 mg  4 mg Oral Q6H PRN Emeline General, MD       Or   ondansetron [facility]) injection 4 mg  4 mg Intravenous Q6H PRN Mikey College T, MD       saccharomyces boulardii (FLORASTOR) capsule 250 mg  250 mg Oral BID Rolly Salter, MD   250 mg at 04/28/23 2100     Discharge Medications: Please see discharge summary for a list of discharge medications.  Relevant Imaging Results:  Relevant Lab Results:   Additional Information SSN: [id number] F Lecretia Buczek, LCSWA

## 2023-04-29 NOTE — Progress Notes (Signed)
Physical Therapy Treatment Patient Details Name: [name] MRN: [id number] DOB: [birth year] Today's Date: 04/29/2023   History of Present Illness 72 y.o. female presents to [facility] on 04/25/2023 with UTI and presumed aspiration PNA. PMH includes COPD, PVD, HTN, kidney stones.    PT Comments    Pt greeted resting in bed and agreeable to session with good progress towards acute goals. Pt continues to demonstrate functional transfers at supervision level without need for physical assist. Pt with increased tolerance for gait with RW support and min guard for safety, with pt needing intermittent cues for RW proximity and upright posture as pt with tendency to push RW out in front of her increasing trunk flexion. Pt continues to be limited by back pain and decreased activity tolerance, needing multiple standing rest breaks throughout gait. Pt continues to benefit from skilled PT services to progress toward functional mobility goals.    Recommendations for follow up therapy are one component of a multi-disciplinary discharge planning process, led by the attending physician.  Recommendations may be updated based on patient status, additional functional criteria and insurance authorization.  Follow Up Recommendations       Assistance Recommended at Discharge PRN  Patient can return home with the following A little help with bathing/dressing/bathroom;Assistance with cooking/housework;Assist for transportation;Help with stairs or ramp for entrance   Equipment Recommendations  None recommended by PT    Recommendations for Other Services       Precautions / Restrictions Precautions Precautions: Fall Restrictions Weight Bearing Restrictions: No     Mobility  Bed Mobility Overal bed mobility: Modified Independent             General bed mobility comments: HOB elevated, increased time    Transfers Overall transfer level: Needs assistance Equipment used: Rolling walker (2  wheels) Transfers: Sit to/from Stand Sit to Stand: Supervision                Ambulation/Gait Ambulation/Gait assistance: Supervision, Min guard Gait Distance (Feet): 340 Feet Assistive device: Rolling walker (2 wheels) Gait Pattern/deviations: Step-through pattern Gait velocity: reduced     General Gait Details: slowed step-through gait, increased trunk flexion with increased fatigue, cues for closer RW proximity, pt needing x3 standing rest breaks   Stairs             Wheelchair Mobility    Modified Rankin (Stroke Patients Only)       Balance Overall balance assessment: Needs assistance Sitting-balance support: No upper extremity supported, Feet supported Sitting balance-Leahy Scale: Good     Standing balance support: Single extremity supported, Reliant on assistive device for balance Standing balance-Leahy Scale: Poor Standing balance comment: reliant on at least single UE supprot during dynamic tasks                            Cognition Arousal/Alertness: Awake/alert Behavior During Therapy: WFL for tasks assessed/performed Overall Cognitive Status: Within Functional Limits for tasks assessed                                          Exercises      General Comments General comments (skin integrity, edema, etc.): VSS on RA      Pertinent Vitals/Pain Pain Assessment Pain Assessment: Faces Faces Pain Scale: Hurts a little bit Pain Location: back Pain Descriptors / Indicators: Sore Pain Intervention(s): Monitored  during session, Limited activity within patient's tolerance    Home Living                          Prior Function            PT Goals (current goals can now be found in the care plan section) Acute Rehab PT Goals PT Goal Formulation: With patient Time For Goal Achievement: 05/12/23 Progress towards PT goals: Progressing toward goals    Frequency    Min 3X/week      PT Plan  Current plan remains appropriate    Co-evaluation              AM-PAC PT "6 Clicks" Mobility   Outcome Measure  Help needed turning from your back to your side while in a flat bed without using bedrails?: None Help needed moving from lying on your back to sitting on the side of a flat bed without using bedrails?: None Help needed moving to and from a bed to a chair (including a wheelchair)?: A Little Help needed standing up from a chair using your arms (e.g., wheelchair or bedside chair)?: A Little Help needed to walk in hospital room?: A Little Help needed climbing 3-5 steps with a railing? : A Little 6 Click Score: 20    End of Session   Activity Tolerance: Patient tolerated treatment well Patient left: in bed;with call bell/phone within reach Nurse Communication: Mobility status PT Visit Diagnosis: Other abnormalities of gait and mobility (R26.89);Muscle weakness (generalized) (M62.81)     Time: 2440-1027 PT Time Calculation (min) (ACUTE ONLY): 18 min  Charges:  $Gait Training: 8-22 mins                     [name] R. PTA Acute Rehabilitation Services Office: [phone]   Catalina Antigua 04/29/2023, 12:21 PM

## 2023-04-29 NOTE — Progress Notes (Signed)
RN at bedside to discuss renal CT to look at kidney stone. Pt raised her voice in frustration and states that she " has had the kidney stone for 7 years and that she came in for asthma, not a kidney stone" Provider messaged and test cancelled

## 2023-04-29 NOTE — Progress Notes (Signed)
Peripherally Inserted Central Catheter Placement  The IV Nurse has discussed with the patient and/or persons authorized to consent for the patient, the purpose of this procedure and the potential benefits and risks involved with this procedure.  The benefits include less needle sticks, lab draws from the catheter, and the patient may be discharged home with the catheter. Risks include, but not limited to, infection, bleeding, blood clot (thrombus formation), and puncture of an artery; nerve damage and irregular heartbeat and possibility to perform a PICC exchange if needed/ordered by physician.  Alternatives to this procedure were also discussed.  Bard Power PICC patient education guide, fact sheet on infection prevention and patient information card has been provided to patient /or left at bedside.    PICC Placement Documentation  PICC Single Lumen 04/29/23 Right Basilic 38 cm 0 cm (Active)  Indication for Insertion or Continuance of Line Prolonged intravenous therapies 04/29/23 1515  Exposed Catheter (cm) 0 cm 04/29/23 1515  Site Assessment Clean, Dry, Intact 04/29/23 1515  Line Status Flushed;Saline locked;Blood return noted 04/29/23 1515  Dressing Type Transparent;Securing device 04/29/23 1515  Dressing Status Antimicrobial disc in place 04/29/23 1515  Safety Lock Not Applicable 04/29/23 1515  Line Care Connections checked and tightened 04/29/23 1515  Line Adjustment (NICU/IV Team Only) No 04/29/23 1515  Dressing Intervention New dressing 04/29/23 1515  Dressing Change Due 05/06/23 04/29/23 1515       Donn Zanetti 04/29/2023, 3:16 PM

## 2023-04-29 NOTE — Progress Notes (Addendum)
PROGRESS NOTE    [name]  ZOX:[id number] DOB: Jun 18, 1951 DOA: 04/25/2023 PCP: Patient, No Pcp Per   Brief Narrative:  [name] is a 72 y.o. female with medical history significant of poorly controlled COPD, PVD with chronic right big toe ulcer, recurrent UTIs, kidney stones, HTN, brought in by family member for evaluation of worsening of cough shortness of breath and admitted with presumed sepsis secondary to community-acquired pneumonia and UTI.  Eventually developed ESBL E. coli in urine as well as blood. Discussed with ID originally recommended 7 days of IV antibiotics.  After discussion with urology current recommendation is for 14 days of IV antibiotics due to right renal stone with hydronephrosis.  Assessment & Plan:   Principal Problem:   UTI (urinary tract infection) Active Problems:   Acute encephalopathy   E coli bacteremia  Sepsis secondary to presumed aspiration pneumonia as well as UTI with ESBL E. coli bacteremia, POA:  2.2 cm nephrolithiasis and right renal pelvis with mild upstream hydronephrosis Patient met criteria for SIRS based on fever of 102.7 and tachypnea.  her blood culture is now growing ESBL E. coli, she was initially on cefepime which was transitioned to [place]. Previous hospitalist discussed with ID as well as urology. Current recommendation is for 14 days of IV antibiotics secondary to presence of the kidney stone with hydronephrosis. Leukocytosis resolved.  She is medically stable.   Patient had evaluated by [facility] urology in the past 6 years ago for the same presentation at which time she was treated with 3 weeks of IV antibiotics and then was sent to SNF.  Per conversation between patient and the urologist back then recommendation was not to treat the stone.  Patient currently does not want lithotripsy. Urology had arranged follow-up for her on 5/8.  Currently recommending CT scan stone protocol.  Recommending consideration for lithotripsy.  Addendum  1:38 PM, 04/29/2023: Was reported by the nurse that patient is also refusing CT renal stone.  Will cancel the study.   COPD: No evidence of exacerbation. She is only on albuterol nebulizer as needed. Dulera added.  Continue albuterol nebulization as needed.  Patient prefers to use that on a scheduled basis.   History of PVD with chronic nonhealing ulcer right great toe:  Appears stable.  family reported that the wound has been taking care of with weekly wound care and the healing process has been satisfying.  Continue aspirin and Plavix.   Essential hypertension: Blood pressure soft.   Continue amlodipine and Coreg per home dose.  Patient would like to continue exactly the same home dose without any modification.   AKI. Resolved.   Hypokalemia. Resolved.  Antibiotic induced diarrhea. No abdominal tenderness. No nausea no vomiting. As needed Imodium.  Diarrhea improved.   Disposition. Multiple prolonged conversation with patient as well as daughter by previous hospitalist Dr. Allena Katz. Pt will need Antibiotics for 14 days total.  Responding well to current regimen.  He informed the daughter that generally E. Coli infection does not require repeat cultures as long as the patient is responding well to the antibiotics. Patient initially refused midline placement. After discussion with the patient she agreed for PICC line which was ordered but now patient is refusing that as well.  Reportedly daughter is resistant to the idea of patient leaving the hospital. Previous hospital also told her that since 04/28/2023.  Daughter also initially agreed for discharging to SNF but now she does not want that either.   Today 04/29/2023, as I entered  the room, patient said " I do not want to see Dr. Allena Katz anymore because he lied to my daughter and said that I have not had a fever since admission, he is an as..le" I reviewed her chart and patient has not had any fever since the one she had in the ED. I told her  that "Dr. Allena Katz had not lied since you have not had any fever since admission". She immediately got angry and said " get the hell out of here, I do not want to see you either, you all want to kick me out and I am not going to go anywhere" I asked her to calm down and then she said " I do not give a f..k about you or your hospital" and showed me her middle fingers and started yelling at me.  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  At that point, I left the room and informed TOC leadership and [facility] leadership about patient's behavior and refusal to comply with recommendations of discharge.  DVT prophylaxis: enoxaparin (LOVENOX) injection 40 mg Start: 04/25/23 1545   Code Status: Full Code  Family Communication:  None present at bedside.   Status is: Inpatient Remains inpatient appropriate because: Patient medically stable, patient is declining the discharge and declining the PICC line.   Estimated body mass index is 27.69 kg/m as calculated from the following:   Height as of this encounter: 5\' 3"  (1.6 m).   Weight as of this encounter: 70.9 kg.    Nutritional Assessment: Body mass index is 27.69 kg/m.Marland Kitchen Seen by dietician.  I agree with the assessment and plan as outlined below: Nutrition Status:        . Skin Assessment: I have examined the patient's skin and I agree with the wound assessment as performed by the wound care RN as outlined below:    Consultants:  ID and urology, the case was discussed by previous hospitalist.  Procedures:  None  Antimicrobials:  Anti-infectives (From admission, onward)    Start     Dose/Rate Route Frequency Ordered Stop   04/28/23 1400  meropenem (MERREM) 1 g in sodium chloride 0.9 % 100 mL IVPB  Status:  Discontinued        1 g 200 mL/hr over 30  Minutes Intravenous Every 8 hours 04/28/23 1052 04/28/23 1109   04/28/23 1200  ertapenem (INVANZ) 1,000 mg in sodium chloride 0.9 % 100 mL IVPB        1 g 200 mL/hr over 30 Minutes Intravenous Every 24 hours 04/28/23 1109 05/09/23 2359   04/27/23 0100  vancomycin (VANCOREADY) IVPB 1250 mg/250 mL  Status:  Discontinued        1,250 mg 166.7 mL/hr over 90 Minutes Intravenous Every 36 hours 04/25/23 1146 04/25/23 1533   04/26/23 0400  meropenem (MERREM) 1 g in sodium chloride 0.9 % 100 mL IVPB  Status:  Discontinued        1 g 200 mL/hr over 30 Minutes Intravenous Every 12 hours 04/26/23 0249 04/28/23 1052   04/26/23 0100  ceFEPIme (MAXIPIME) 2 g in sodium chloride 0.9 % 100 mL IVPB  Status:  Discontinued        2 g 200 mL/hr over 30 Minutes Intravenous Every 12  hours 04/25/23 1146 04/25/23 1533   04/25/23 1545  cefTRIAXone (ROCEPHIN) 1 g in sodium chloride 0.9 % 100 mL IVPB  Status:  Discontinued        1 g 200 mL/hr over 30 Minutes Intravenous Every 24 hours 04/25/23 1534 04/26/23 0249   04/25/23 1545  azithromycin (ZITHROMAX) tablet 500 mg  Status:  Discontinued        500 mg Oral Daily 04/25/23 1540 04/26/23 0249   04/25/23 1145  aztreonam (AZACTAM) 2 g in sodium chloride 0.9 % 100 mL IVPB  Status:  Discontinued        2 g 200 mL/hr over 30 Minutes Intravenous  Once 04/25/23 1139 04/25/23 1140   04/25/23 1145  metroNIDAZOLE (FLAGYL) IVPB 500 mg        500 mg 100 mL/hr over 60 Minutes Intravenous  Once 04/25/23 1139 04/25/23 1303   04/25/23 1145  vancomycin (VANCOCIN) IVPB 1000 mg/200 mL premix  Status:  Discontinued        1,000 mg 200 mL/hr over 60 Minutes Intravenous  Once 04/25/23 1139 04/25/23 1141   04/25/23 1145  ceFEPIme (MAXIPIME) 2 g in sodium chloride 0.9 % 100 mL IVPB        2 g 200 mL/hr over 30 Minutes Intravenous  Once 04/25/23 1141 04/25/23 1240   04/25/23 1145  vancomycin (VANCOREADY) IVPB 1500 mg/300 mL        1,500 mg 150 mL/hr over 120 Minutes Intravenous  Once  04/25/23 1141 04/25/23 1616         Subjective: Patient seen and examined.  She had no specific bodily complaints.  See details above.  Objective: Vitals:   04/29/23 0440 04/29/23 0805 04/29/23 0809 04/29/23 0814  BP: 137/69 (!) 119/96    Pulse: 71 77    Resp: 18 18    Temp: 98.1 F (36.7 C) 98.4 F (36.9 C)    TempSrc: Oral Oral    SpO2: 95% 96% 96% 96%  Weight:      Height:        Intake/Output Summary (Last 24 hours) at 04/29/2023 1236 Last data filed at 04/29/2023 0800 Gross per 24 hour  Intake 360 ml  Output 0 ml  Net 360 ml   Filed Weights   04/25/23 0941 04/25/23 1732  Weight: 72.6 kg 70.9 kg    Examination:  General exam: Appears calm and comfortable  Respiratory system: Clear to auscultation. Respiratory effort normal. Cardiovascular system: S1 & S2 heard, RRR. No JVD, murmurs, rubs, gallops or clicks. No pedal edema. Gastrointestinal system: Abdomen is nondistended, soft and nontender. No organomegaly or masses felt. Normal bowel sounds heard. Central nervous system: Alert and oriented. No focal neurological deficits.   Data Reviewed: I have personally reviewed following labs and imaging studies  CBC: Recent Labs  Lab 04/25/23 0950 04/25/23 1040 04/26/23 0928 04/27/23 0113 04/28/23 0850 04/29/23 0226  WBC 4.1  --  18.2* 16.0* 9.5 8.3  NEUTROABS 3.7  --   --  12.7* 7.2 5.5  HGB 13.1 13.3 10.4* 10.7* 12.7 13.1  HCT 39.9 39.0 32.3* 33.7* 39.6 39.1  MCV 81.4  --  82.0 81.6 80.2 78.8*  PLT 318  --  283 315 359 349   Basic Metabolic Panel: Recent Labs  Lab 04/25/23 0950 04/25/23 1040 04/27/23 0113 04/28/23 0850 04/29/23 0226  NA 135 136 137 130* 134*  K 3.9 3.3* 3.0* 3.5 3.7  CL 97*  --  103 96* 104  CO2 23  --  26 23 22   GLUCOSE 158*  --  131* 138* 118*  BUN 16  --  15 15 18   CREATININE 1.17*  --  1.16* 0.87 0.87  CALCIUM 8.9  --  8.5* 8.7* 8.8*  MG  --   --   --  1.8 2.0   GFR: Estimated Creatinine Clearance: 55.2 mL/min (by C-G  formula based on SCr of 0.87 mg/dL). Liver Function Tests: Recent Labs  Lab 04/25/23 0950  AST 27  ALT 18  ALKPHOS 81  BILITOT 0.5  PROT 7.3  ALBUMIN 3.1*   No results for input(s): "LIPASE", "AMYLASE" in the last 168 hours. No results for input(s): "AMMONIA" in the last 168 hours. Coagulation Profile: Recent Labs  Lab 04/25/23 1145  INR 1.2   Cardiac Enzymes: No results for input(s): "CKTOTAL", "CKMB", "CKMBINDEX", "TROPONINI" in the last 168 hours. BNP (last 3 results) No results for input(s): "PROBNP" in the last 8760 hours. HbA1C: No results for input(s): "HGBA1C" in the last 72 hours. CBG: Recent Labs  Lab 04/25/23 1022  GLUCAP 147*   Lipid Profile: No results for input(s): "CHOL", "HDL", "LDLCALC", "TRIG", "CHOLHDL", "LDLDIRECT" in the last 72 hours. Thyroid Function Tests: No results for input(s): "TSH", "T4TOTAL", "FREET4", "T3FREE", "THYROIDAB" in the last 72 hours. Anemia Panel: No results for input(s): "VITAMINB12", "FOLATE", "FERRITIN", "TIBC", "IRON", "RETICCTPCT" in the last 72 hours. Sepsis Labs: Recent Labs  Lab 04/25/23 1202 04/25/23 1618 04/26/23 0928  PROCALCITON  --   --  15.35  LATICACIDVEN 2.1* 1.3  --     Recent Results (from the past 240 hour(s))  MRSA Next Gen by PCR, Nasal     Status: None   Collection Time: 04/25/23 11:46 AM   Specimen: Nasal Mucosa; Nasal Swab  Result Value Ref Range Status   MRSA by PCR Next Gen NOT DETECTED NOT DETECTED Final    Comment: (NOTE) The GeneXpert MRSA Assay (FDA approved for NASAL specimens only), is one component of a comprehensive MRSA colonization surveillance program. It is not intended to diagnose MRSA infection nor to guide or monitor treatment for MRSA infections. Test performance is not FDA approved in patients less than 7 years old. Performed at [facility], [address] [place], [place] [address]   Blood Culture (routine x 2)     Status: Abnormal   Collection Time: 04/25/23  11:55 AM   Specimen: BLOOD  Result Value Ref Range Status   Specimen Description BLOOD LEFT ANTECUBITAL  Final   Special Requests   Final    BOTTLES DRAWN AEROBIC AND ANAEROBIC Blood Culture results may not be optimal due to an excessive volume of blood received in culture bottles   Culture  Setup Time   Final    GRAM NEGATIVE RODS IN BOTH AEROBIC AND ANAEROBIC BOTTLES CRITICAL RESULT CALLED TO, READ BACK BY AND VERIFIED WITH: PHARMD G. ABBOTT 04/26/2023 @ 0227 BY AB Performed at [facility], [address] [place], [place] [address]    Culture (A)  Final    ESCHERICHIA COLI Confirmed Extended Spectrum Beta-Lactamase Producer (ESBL).  In bloodstream infections from ESBL organisms, carbapenems are preferred over piperacillin/tazobactam. They are shown to have a lower risk of mortality.    Report Status 04/28/2023 FINAL  Final   Organism ID, Bacteria ESCHERICHIA COLI  Final      Susceptibility   Escherichia coli - MIC*    AMPICILLIN >=32 RESISTANT Resistant     CEFEPIME 16 RESISTANT Resistant  CEFTAZIDIME RESISTANT Resistant     CEFTRIAXONE >=64 RESISTANT Resistant     CIPROFLOXACIN >=4 RESISTANT Resistant     GENTAMICIN >=16 RESISTANT Resistant     IMIPENEM <=0.25 SENSITIVE Sensitive     TRIMETH/SULFA >=320 RESISTANT Resistant     AMPICILLIN/SULBACTAM >=32 RESISTANT Resistant     PIP/TAZO 16 SENSITIVE Sensitive     * ESCHERICHIA COLI  Blood Culture ID Panel (Reflexed)     Status: Abnormal   Collection Time: 04/25/23 11:55 AM  Result Value Ref Range Status   Enterococcus faecalis NOT DETECTED NOT DETECTED Final   Enterococcus Faecium NOT DETECTED NOT DETECTED Final   Listeria monocytogenes NOT DETECTED NOT DETECTED Final   Staphylococcus species NOT DETECTED NOT DETECTED Final   Staphylococcus aureus (BCID) NOT DETECTED NOT DETECTED Final   Staphylococcus epidermidis NOT DETECTED NOT DETECTED Final   Staphylococcus lugdunensis NOT DETECTED NOT DETECTED Final    Streptococcus species NOT DETECTED NOT DETECTED Final   Streptococcus agalactiae NOT DETECTED NOT DETECTED Final   Streptococcus pneumoniae NOT DETECTED NOT DETECTED Final   Streptococcus pyogenes NOT DETECTED NOT DETECTED Final   A.calcoaceticus-baumannii NOT DETECTED NOT DETECTED Final   Bacteroides fragilis NOT DETECTED NOT DETECTED Final   Enterobacterales DETECTED (A) NOT DETECTED Final    Comment: Enterobacterales represent a large order of gram negative bacteria, not a single organism. CRITICAL RESULT CALLED TO, READ BACK BY AND VERIFIED WITH: PHARMD G. ABBOTT 04/26/2023 @ 0227 BY AB    Enterobacter cloacae complex NOT DETECTED NOT DETECTED Final   Escherichia coli DETECTED (A) NOT DETECTED Final    Comment: CRITICAL RESULT CALLED TO, READ BACK BY AND VERIFIED WITH: PHARMD G. ABBOTT 04/26/2023 @ 0227 BY AB    Klebsiella aerogenes NOT DETECTED NOT DETECTED Final   Klebsiella oxytoca NOT DETECTED NOT DETECTED Final   Klebsiella pneumoniae NOT DETECTED NOT DETECTED Final   Proteus species NOT DETECTED NOT DETECTED Final   Salmonella species NOT DETECTED NOT DETECTED Final   Serratia marcescens NOT DETECTED NOT DETECTED Final   Haemophilus influenzae NOT DETECTED NOT DETECTED Final   Neisseria meningitidis NOT DETECTED NOT DETECTED Final   Pseudomonas aeruginosa NOT DETECTED NOT DETECTED Final   Stenotrophomonas maltophilia NOT DETECTED NOT DETECTED Final   Candida albicans NOT DETECTED NOT DETECTED Final   Candida auris NOT DETECTED NOT DETECTED Final   Candida glabrata NOT DETECTED NOT DETECTED Final   Candida krusei NOT DETECTED NOT DETECTED Final   Candida parapsilosis NOT DETECTED NOT DETECTED Final   Candida tropicalis NOT DETECTED NOT DETECTED Final   Cryptococcus neoformans/gattii NOT DETECTED NOT DETECTED Final   CTX-M ESBL DETECTED (A) NOT DETECTED Final    Comment: CRITICAL RESULT CALLED TO, READ BACK BY AND VERIFIED WITH: PHARMD G. ABBOTT 04/26/2023 @ 0227 BY  AB (NOTE) Extended spectrum beta-lactamase detected. Recommend a carbapenem as initial therapy.      Carbapenem resistance IMP NOT DETECTED NOT DETECTED Final   Carbapenem resistance KPC NOT DETECTED NOT DETECTED Final   Carbapenem resistance NDM NOT DETECTED NOT DETECTED Final   Carbapenem resist OXA 48 LIKE NOT DETECTED NOT DETECTED Final   Carbapenem resistance VIM NOT DETECTED NOT DETECTED Final    Comment: Performed at [facility], [address], [place], [place] [address]  Blood Culture (routine x 2)     Status: None (Preliminary result)   Collection Time: 04/25/23 12:01 PM   Specimen: BLOOD RIGHT FOREARM  Result Value Ref Range Status   Specimen Description  BLOOD RIGHT FOREARM  Final   Special Requests   Final    BOTTLES DRAWN AEROBIC AND ANAEROBIC Blood Culture adequate volume   Culture   Final    NO GROWTH 4 DAYS Performed at [facility], [address], [place], [place] [address]    Report Status PENDING  Incomplete  Urine Culture     Status: Abnormal   Collection Time: 04/25/23  1:00 PM   Specimen: Urine, Random  Result Value Ref Range Status   Specimen Description URINE, RANDOM  Final   Special Requests   Final    URINE, CATHETERIZED Performed at [facility], [address] [place], [place] [address]    Culture (A)  Final    80,000 COLONIES/mL ESCHERICHIA COLI Confirmed Extended Spectrum Beta-Lactamase Producer (ESBL).  In bloodstream infections from ESBL organisms, carbapenems are preferred over piperacillin/tazobactam. They are shown to have a lower risk of mortality.    Report Status 04/27/2023 FINAL  Final   Organism ID, Bacteria ESCHERICHIA COLI (A)  Final      Susceptibility   Escherichia coli - MIC*    AMPICILLIN >=32 RESISTANT Resistant     CEFAZOLIN >=64 RESISTANT Resistant     CEFEPIME 16 RESISTANT Resistant     CEFTRIAXONE >=64 RESISTANT Resistant     CIPROFLOXACIN >=4 RESISTANT Resistant     GENTAMICIN >=16 RESISTANT  Resistant     IMIPENEM <=0.25 SENSITIVE Sensitive     NITROFURANTOIN 256 RESISTANT Resistant     TRIMETH/SULFA >=320 RESISTANT Resistant     AMPICILLIN/SULBACTAM >=32 RESISTANT Resistant     PIP/TAZO 8 SENSITIVE Sensitive     * 80,000 COLONIES/mL ESCHERICHIA COLI     Radiology Studies: [place] EKG SITE RITE  Result Date: 04/28/2023 If Site Rite image not attached, placement could not be confirmed due to current cardiac rhythm.   Scheduled Meds:  amLODipine  10 mg Oral QPM   aspirin EC  81 mg Oral Daily   benzonatate  100 mg Oral TID   carvedilol  12.5 mg Oral BID WC   clopidogrel  75 mg Oral Q breakfast   enoxaparin (LOVENOX) injection  40 mg Subcutaneous Q24H   guaiFENesin  1,200 mg Oral BID   loratadine  10 mg Oral Daily   melatonin  10 mg Oral QHS   mometasone-formoterol  2 puff Inhalation BID   saccharomyces boulardii  250 mg Oral BID   Continuous Infusions:  ertapenem 1,000 mg (04/29/23 1054)     LOS: 3 days   Hughie Closs, MD [facility]  04/29/2023, 12:36 PM   *Please note that this is a verbal dictation therefore any spelling or grammatical errors are due to the "[employer]agon [employer] One" system interpretation.  Please page via Amion and do not message via secure chat for urgent patient care matters. Secure chat can be used for non urgent patient care matters.  How to contact the [facility] Attending or Consulting provider 7A - 7P or covering provider during after hours 7P -7A, for this patient?  Check the care team in [facility] and look for a) attending/consulting [facility] provider listed and b) the [facility] team listed. Page or secure chat 7A-7P. Log into [email or web address] and use [place]'s universal password to access. If you do not have the password, please contact the hospital operator. Locate the [facility] provider you are looking for under [facility] and page to a number that you can be directly reached. If you still have difficulty reaching the provider,  please page the [facility]  (Director on Call) for the Hospitalists listed on amion for assistance.

## 2023-04-29 NOTE — TOC Progression Note (Signed)
Transition of Care [facility]) - Initial/Assessment Note    Patient Details  Name: [name] MRN: [id number] Date of Birth: 02-20-1951  Transition of Care [facility]) CM/SW Contact:    Ralene Bathe, LCSWA Phone Number: 04/29/2023, 3:04 PM  Clinical Narrative:                 LCSW attempted to contact patient's daughter to inform that the patient has been accepted at [facility] (daughter's facility choice). There was no answer.  LCSW left a VM requesting a returned call.    TOC following.  Expected Discharge Plan: Home w Home Health Services Barriers to Discharge: Continued Medical Work up   Patient Goals and CMS Choice Patient states their goals for this hospitalization and ongoing recovery are:: To return home CMS Medicare.gov Compare Post Acute Care list provided to:: Patient Choice offered to / list presented to : Patient      Expected Discharge Plan and Services   Discharge Planning Services: CM Consult   Living arrangements for the past 2 months: Single Family Home                 DME Arranged: N/A DME Agency: NA       HH Arranged: RN, IV Antibiotics HH Agency: [facility], [facility] Date [facility] Agency Contacted: 04/28/23 Time HH Agency Contacted: 1230 Representative spoke with at [facility] Agency: Pam with [facility] for IV abx and Kandee Keen with [place] for [employer].  Prior Living Arrangements/Services Living arrangements for the past 2 months: Single Family Home Lives with:: Self Patient language and need for interpreter reviewed:: Yes Do you feel safe going back to the place where you live?: Yes      Need for Family Participation in Patient Care: Yes (Comment) Care giver support system in place?: Yes (comment)   Criminal Activity/Legal Involvement Pertinent to Current Situation/Hospitalization: No - Comment as needed  Activities of Daily Living Home Assistive Devices/Equipment: Wheelchair, Environmental consultant (specify type) ADL Screening (condition at time of  admission) Patient's cognitive ability adequate to safely complete daily activities?: Yes Is the patient deaf or have difficulty hearing?: No Does the patient have difficulty seeing, even when wearing glasses/contacts?: No Does the patient have difficulty concentrating, remembering, or making decisions?: No Patient able to express need for assistance with ADLs?: Yes Does the patient have difficulty dressing or bathing?: Yes Independently performs ADLs?: No Communication: Independent Dressing (OT): Needs assistance Is this a change from baseline?: Pre-admission baseline Grooming: Needs assistance Is this a change from baseline?: Pre-admission baseline Feeding: Independent Is this a change from baseline?: Pre-admission baseline Bathing: Needs assistance Is this a change from baseline?: Pre-admission baseline Toileting: Dependent Is this a change from baseline?: Pre-admission baseline In/Out Bed: Needs assistance Is this a change from baseline?: Pre-admission baseline Walks in Home: Needs assistance Is this a change from baseline?: Pre-admission baseline Does the patient have difficulty walking or climbing stairs?: Yes Weakness of Legs: Both Weakness of Arms/Hands: None  Permission Sought/Granted Permission sought to share information with : Case Manager, Magazine features editor, Family Supports Permission granted to share information with : Yes, Verbal Permission Granted              Emotional Assessment Appearance:: Appears stated age Attitude/Demeanor/Rapport: Engaged, Gracious Affect (typically observed): Accepting, Appropriate, Calm, Hopeful, Pleasant Orientation: : Oriented to Self, Oriented to Place, Oriented to  Time, Oriented to Situation Alcohol / Substance Use: Not Applicable Psych Involvement: No (comment)  Admission diagnosis:  UTI (urinary tract infection) [N39.0]  Urinary tract infection with hematuria, site unspecified [N39.0, R31.9] Pneumonia of both  lungs due to infectious organism, unspecified part of lung [J18.9] Sepsis without acute organ dysfunction, due to unspecified organism (HCC) [A41.9] E coli bacteremia [R78.81, B96.20] Patient Active Problem List   Diagnosis Date Noted   ESBL (extended spectrum beta-lactamase) producing bacteria infection 04/29/2023   Nephrolithiasis 04/29/2023   E coli bacteremia 04/26/2023   UTI (urinary tract infection) 04/25/2023   Acute encephalopathy 04/25/2023   PVD (peripheral vascular disease) (HCC) 01/07/2023   Peripheral neuropathy 01/07/2023   Cellulitis of great toe of right foot 01/06/2023   PCP:  Patient, No Pcp Per Pharmacy:   [facility] [address] [facility] - [place], [place] - [address] AT [facility] [address] [place] [address] Phone: [phone] Fax: [phone]     Social Determinants of Health (SDOH) Social History: SDOH Screenings   Food Insecurity: No Food Insecurity (04/25/2023)  Housing: Low Risk  (04/25/2023)  Transportation Needs: No Transportation Needs (04/25/2023)  Utilities: Not At Risk (04/25/2023)  Tobacco Use: Medium Risk (04/25/2023)   SDOH Interventions: Transportation Interventions: Intervention Not Indicated, Patient Resources (Friends/Family), Inpatient TOC   Readmission Risk Interventions    04/28/2023    4:20 PM  Readmission Risk Prevention Plan  Post Dischage Appt Complete  Medication Screening Complete  Transportation Screening Complete

## 2023-04-30 DIAGNOSIS — N3001 Acute cystitis with hematuria: Secondary | ICD-10-CM | POA: Diagnosis not present

## 2023-04-30 LAB — CULTURE, BLOOD (ROUTINE X 2)

## 2023-04-30 MED ORDER — GUAIFENESIN ER 600 MG PO TB12: 1200.0000 mg | ORAL_TABLET | Freq: Two times a day (BID) | ORAL | Status: DC | PRN

## 2023-04-30 MED ORDER — BENZONATATE 100 MG PO CAPS: 100.0000 mg | ORAL_CAPSULE | Freq: Three times a day (TID) | ORAL | Status: DC | PRN

## 2023-04-30 NOTE — Progress Notes (Signed)
PT is showing inconsistent behavior toward staff during this shift. Sometimes, she is appropriate, then she acutely becomes agitated and verbally aggressive without any triggers. The RN used therapeutic and deescalating techniques and asked the patient nicely to reduce the verbal abuse and be respectful to staff. Pt stated, "I will treat you with no respect and deal with it like the rest." Will continue to use therapeutic and deescalating techniques. Pt would benefit from a psychiatric consult.

## 2023-04-30 NOTE — Progress Notes (Signed)
Physical Therapy Treatment Patient Details Name: [name] MRN: [id number] DOB: [birth year] Today's Date: 04/30/2023   History of Present Illness 72 y.o. female presents to [facility] hospital on 04/25/2023 with UTI and presumed aspiration PNA. PMH includes COPD, PVD, HTN, kidney stones.    PT Comments    Pt greeted resting in bed and agreeable to session with continued progress towards acute goals. Pt able to progress gait this session with rollator support and min guard for safety, however pt continues to be limited by fatigue and UE pain/weakness during gait as pt with flexed trunk, bearing increased weight throughout Ues, despite frequent cues for upright posture. Encouraged and educated pt on importance of frequent mobilization and importance of time up OOB with pt verbalizing understanding, however declining time up in chair at end of session. Pt continues to benefit from skilled PT services to progress toward functional mobility goals, will continue to follow acutely.    Recommendations for follow up therapy are one component of a multi-disciplinary discharge planning process, led by the attending physician.  Recommendations may be updated based on patient status, additional functional criteria and insurance authorization.  Follow Up Recommendations       Assistance Recommended at Discharge PRN  Patient can return home with the following A little help with bathing/dressing/bathroom;Assistance with cooking/housework;Assist for transportation;Help with stairs or ramp for entrance   Equipment Recommendations  None recommended by PT    Recommendations for Other Services       Precautions / Restrictions Precautions Precautions: Fall Restrictions Weight Bearing Restrictions: No     Mobility  Bed Mobility Overal bed mobility: Modified Independent             General bed mobility comments: HOB elevated, increased time    Transfers Overall transfer level: Needs  assistance Equipment used: Rollator (4 wheels) Transfers: Sit to/from Stand Sit to Stand: Supervision                Ambulation/Gait Ambulation/Gait assistance: Supervision, Min guard Gait Distance (Feet): 120 Feet (x2 with seated rest on rollator seat) Assistive device: Rollator (4 wheels) Gait Pattern/deviations: Step-through pattern Gait velocity: reduced     General Gait Details: slowed step-through gait, increased trunk flexion with increased fatigue, cues for upright posture   Stairs             Wheelchair Mobility    Modified Rankin (Stroke Patients Only)       Balance Overall balance assessment: Needs assistance Sitting-balance support: No upper extremity supported, Feet supported Sitting balance-Leahy Scale: Good     Standing balance support: Single extremity supported, Reliant on assistive device for balance Standing balance-Leahy Scale: Poor Standing balance comment: reliant on at least single UE supprot during dynamic tasks                            Cognition Arousal/Alertness: Awake/alert Behavior During Therapy: WFL for tasks assessed/performed Overall Cognitive Status: Within Functional Limits for tasks assessed                                          Exercises      General Comments General comments (skin integrity, edema, etc.): VSS on RA      Pertinent Vitals/Pain Pain Assessment Pain Assessment: Faces Faces Pain Scale: Hurts a little bit Pain Location: back Pain Descriptors / Indicators: Sore  Pain Intervention(s): Monitored during session, Limited activity within patient's tolerance, Repositioned    Home Living                          Prior Function            PT Goals (current goals can now be found in the care plan section) Acute Rehab PT Goals PT Goal Formulation: With patient Time For Goal Achievement: 05/12/23 Progress towards PT goals: Progressing toward goals     Frequency    Min 3X/week      PT Plan Current plan remains appropriate    Co-evaluation              AM-PAC PT "6 Clicks" Mobility   Outcome Measure  Help needed turning from your back to your side while in a flat bed without using bedrails?: None Help needed moving from lying on your back to sitting on the side of a flat bed without using bedrails?: None Help needed moving to and from a bed to a chair (including a wheelchair)?: A Little Help needed standing up from a chair using your arms (e.g., wheelchair or bedside chair)?: A Little Help needed to walk in hospital room?: A Little Help needed climbing 3-5 steps with a railing? : A Little 6 Click Score: 20    End of Session Equipment Utilized During Treatment: Gait belt Activity Tolerance: Patient tolerated treatment well;Patient limited by fatigue Patient left: in bed;with call bell/phone within reach Nurse Communication: Mobility status PT Visit Diagnosis: Other abnormalities of gait and mobility (R26.89);Muscle weakness (generalized) (M62.81)     Time: 1610-9604 PT Time Calculation (min) (ACUTE ONLY): 22 min  Charges:  $Gait Training: 8-22 mins                     Itzell Bendavid R. PTA Acute Rehabilitation Services Office: [phone]    Catalina Antigua 04/30/2023, 4:49 PM

## 2023-04-30 NOTE — Progress Notes (Signed)
Pt refusing Prevalon boots

## 2023-04-30 NOTE — Progress Notes (Addendum)
[name]  ZOX:[id number] DOB: [birth year] DOA: 04/25/2023 PCP: Patient, No Pcp Per    Brief Narrative:  72 year old with a history of poorly controlled COPD, peripheral vascular disease with chronic right hallux ulcer, recurrent UTIs, nephrolithiasis, and HTN who was brought to the ER by family member due to worsening of cough and shortness of breath.  Following admission she was found to be suffering with an ESBL E. coli urinary tract infection as well as bacteremia.  The patient has been seen in consultation by both infectious disease and urology.  Given the presence of a large urinary stone it has been suggested that she be treated with a full 14-day course of antibiotics.  Consultants:  Urology ID  Goals of Care:  Code Status: Full Code   DVT prophylaxis: Lovenox  Interim Hx: Resting comfortably in bed at the time of my exam.  Tells me she feels very weak and poor in general.  Becomes very frustrated when I tell her that she is medically stable.  We had a relatively spirited exchange in which I attempted to explain to her that ESBL E. coli was not a risk to her family or loved ones if they are immunologically intact and healthy and that a simple need for antibiotics was not an indication for ongoing acute hospitalization.  I further suggested, given her generalized weakness and the severity of her acute illness at presentation, that she should consider a rehab stay in a local SNF.  I explained that PT/OT efforts in the acute hospital were quite limited and that I feared that ongoing acute hospitalization would only lead to her becoming weaker and weaker.  As we continued to talk the conversation de-escalated.  The patient agreed to consider a rehab stay and we agreed to discuss it further tomorrow.  I also discussed her significant kidney stone with her and reiterated my fear that she would continue to have recurrent infections and possibly even lose the affected kidney if she did not have  this definitively managed.  She confirmed that she had been told this and that she was in fact intending to follow-up with the urologist in the outpatient setting.  Assessment & Plan:  ESBL E. coli complicated UTI with bacteremia POA Complicated by presence of significant nephrolithiasis - changed from cefepime to meropenem given culture revealing ESBL E. Coli - care discussed with ID and Urology with the decision made to pursue 14 days of IV antibiotics given complication of her kidney stone and hydronephrosis - has clinically stabilized with directed IV antibiotic care - WBC has normalized - patient is afebrile -she is medically stable for disposition at this time but given her generalized weakness I do not feel she is safe to go home  2.2 cm right renal pelvis nephrolithiasis with mild upstream hydronephrosis Has previously been evaluated by Urology at [facility] - was re-evaluated by Urologist during this hospital stay -patient has been educated that failure to remove the stone will make it very difficult to permanently clear her of a urinary tract infection, and could also lead to loss of the affected kidney if full obstruction were to occur - she tells me she does intend to follow-up with the urologist in the outpatient setting  Possible aspiration pneumonia/pneumonitis Symptoms have resolved with antibiotic therapy and supportive care  COPD Utilizes only albuterol nebulizers at home as needed - Dulera added this admission for maintenance therapy - stable at this time without active wheezing on exam  PVD with chronic nonhealing  right hallux ulcer Stable on exam this admission -family reports weekly wound care with steady improvement in wounds of R foot - pt tells me she is followed in a wound care clinic in [place] -continue usual aspirin and Plavix  HTN Patient has asked to continue her usual home amlodipine and Coreg without adjustments made in her dose -blood pressure currently  well-controlled  Acute kidney injury Baseline creatinine 0.8 -serum creatinine increased to 1.16 during his hospital stay -IV hydration was ordered by the attending physician but the patient refused -creatinine has normalized and is stable at this time  Hypokalemia Resolved  Antibiotic induced diarrhea No worrisome clinical features -continue probiotic and as needed Imodium   Family Communication: No family present at time of exam Disposition: Awaiting SNF placement for a rehab stay -is medically stable for discharge at this time   Objective: Blood pressure 117/68, pulse 69, temperature 98.2 F (36.8 C), temperature source Oral, resp. rate 18, height 5\' 3"  (1.6 m), weight 70.9 kg, SpO2 95 %.  Intake/Output Summary (Last 24 hours) at 04/30/2023 1032 Last data filed at 04/30/2023 0830 Gross per 24 hour  Intake 1000 ml  Output 0 ml  Net 1000 ml   Filed Weights   04/25/23 0941 04/25/23 1732  Weight: 72.6 kg 70.9 kg    Examination: General: No acute respiratory distress Lungs: Clear to auscultation bilaterally without wheezes or crackles Cardiovascular: Regular rate and rhythm without murmur  Abdomen: Nontender, nondistended, soft, bowel sounds positive, no rebound Extremities: No significant edema bilateral lower extremities  CBC: Recent Labs  Lab 04/27/23 0113 04/28/23 0850 04/29/23 0226  WBC 16.0* 9.5 8.3  NEUTROABS 12.7* 7.2 5.5  HGB 10.7* 12.7 13.1  HCT 33.7* 39.6 39.1  MCV 81.6 80.2 78.8*  PLT 315 359 349   Basic Metabolic Panel: Recent Labs  Lab 04/27/23 0113 04/28/23 0850 04/29/23 0226  NA 137 130* 134*  K 3.0* 3.5 3.7  CL 103 96* 104  CO2 26 23 22   GLUCOSE 131* 138* 118*  BUN 15 15 18   CREATININE 1.16* 0.87 0.87  CALCIUM 8.5* 8.7* 8.8*  MG  --  1.8 2.0   GFR: Estimated Creatinine Clearance: 55.2 mL/min (by C-G formula based on SCr of 0.87 mg/dL).   Scheduled Meds:  amLODipine  10 mg Oral QPM   aspirin EC  81 mg Oral Daily   benzonatate  100  mg Oral TID   carvedilol  12.5 mg Oral BID WC   Chlorhexidine Gluconate Cloth  6 each Topical Daily   clopidogrel  75 mg Oral Q breakfast   enoxaparin (LOVENOX) injection  40 mg Subcutaneous Q24H   guaiFENesin  1,200 mg Oral BID   loratadine  10 mg Oral Daily   melatonin  10 mg Oral QHS   mometasone-formoterol  2 puff Inhalation BID   saccharomyces boulardii  250 mg Oral BID   sodium chloride flush  10-40 mL Intracatheter Q12H   Continuous Infusions:  ertapenem 1,000 mg (04/29/23 1734)     LOS: 4 days   Lonia Blood, MD [facility] Office  [phone] Pager - Text Page per Loretha Stapler  If 7PM-7AM, please contact night-coverage per Amion 04/30/2023, 10:32 AM

## 2023-05-01 DIAGNOSIS — N3001 Acute cystitis with hematuria: Secondary | ICD-10-CM | POA: Diagnosis not present

## 2023-05-01 LAB — BASIC METABOLIC PANEL
Anion gap: 10 (ref 5–15)
BUN: 14 mg/dL (ref 8–23)
CO2: 24 mmol/L (ref 22–32)
Calcium: 9 mg/dL (ref 8.9–10.3)
Chloride: 102 mmol/L (ref 98–111)
Creatinine, Ser: 0.88 mg/dL (ref 0.44–1.00)
GFR, Estimated: >60 mL/min (ref >60)
Glucose, Bld: 113 mg/dL — ABNORMAL HIGH (ref 70–99)
Potassium: 3.6 mmol/L (ref 3.5–5.1)
Sodium: 136 mmol/L (ref 135–145)

## 2023-05-01 LAB — CBC
HCT: 37 % (ref 36.0–46.0)
Hemoglobin: 12.3 g/dL (ref 12.0–15.0)
MCH: 26.9 pg (ref 26.0–34.0)
MCHC: 33.2 g/dL (ref 30.0–36.0)
MCV: 80.8 fL (ref 80.0–100.0)
Platelets: 353 10*3/uL (ref 150–400)
RBC: 4.58 MIL/uL (ref 3.87–5.11)
RDW: 13.8 % (ref 11.5–15.5)
WBC: 8.6 10*3/uL (ref 4.0–10.5)
nRBC: 0 % (ref 0.0–0.2)

## 2023-05-01 MED ORDER — ALBUTEROL SULFATE (2.5 MG/3ML) 0.083% IN NEBU
2.5000 mg | INHALATION_SOLUTION | Freq: Four times a day (QID) | RESPIRATORY_TRACT | Status: DC | PRN
  Filled 2023-05-01: qty 3

## 2023-05-01 NOTE — Plan of Care (Signed)

## 2023-05-01 NOTE — Progress Notes (Signed)
Pt temperature is 101.4 F. PRN tylenol provided. Will continue to monitor. Dr. On called notified.

## 2023-05-01 NOTE — TOC Progression Note (Signed)
Transition of Care [facility]) - Progression Note    Patient Details  Name: [name] MRN: [id number] Date of Birth: 1951/07/25  [facility]) CM/SW Contact  Inis Sizer, LCSW Phone Number: 05/01/2023, 1:44 PM  Clinical Narrative:    CSW spoke with MD regarding patient being medically stable for discharged and that was confirmed.  CSW spoke with patient to discuss discharge plan. Patient requesting CSW contact her daughter [name] to discuss SNF.  CSW spoke with [name] who states she never requested [facility] but desires placement at [facility] instead.  CSW attempted to reach staff at [facility] several times without success. CSW texted Elmarie Shiley [phone]) at [facility] requesting a review of patient's clinicals that were sent in the HUB.  CSW updated MD.  Expected Discharge Plan: Home w Home Health Services Barriers to Discharge: Continued Medical Work up  Expected Discharge Plan and Services   Discharge Planning Services: CM Consult   Living arrangements for the past 2 months: Single Family Home                 DME Arranged: N/A DME Agency: NA       HH Arranged: RN, IV Antibiotics HH Agency: [facility], [facility] Date [facility] Agency Contacted: 04/28/23 Time HH Agency Contacted: 1230 Representative spoke with at [facility] Agency: Pam with [facility] for IV abx and Kandee Keen with [facility] for [employer].   Social Determinants of Health (SDOH) Interventions SDOH Screenings   Food Insecurity: No Food Insecurity (04/25/2023)  Housing: Low Risk  (04/25/2023)  Transportation Needs: No Transportation Needs (04/25/2023)  Utilities: Not At Risk (04/25/2023)  Tobacco Use: Medium Risk (04/25/2023)    Readmission Risk Interventions    04/28/2023    4:20 PM  Readmission Risk Prevention Plan  Post Dischage Appt Complete  Medication Screening Complete  Transportation Screening Complete

## 2023-05-01 NOTE — Progress Notes (Signed)
Spoke to [employer] with [facility] [phone] who reports they will review pt again Monday as there is documentation showing pt was agitated and verbally aggressive towards staff yesterday.  Dellie Burns, MSW, LCSW [phone] (coverage)

## 2023-05-01 NOTE — Progress Notes (Signed)
Mobility Specialist Progress Note   05/01/23 1445  Mobility  Activity Ambulated with assistance in hallway  Level of Assistance Minimal assist, patient does 75% or more  Assistive Device Four wheel walker (Rollator)  Distance Ambulated (ft) 54 ft  Activity Response Tolerated fair  Mobility Referral Yes  $Mobility charge 1 Mobility  Mobility Specialist Start Time (ACUTE ONLY) 1445  Mobility Specialist Stop Time (ACUTE ONLY) 1506  Mobility Specialist Time Calculation (min) (ACUTE ONLY) 21 min   Received sitting EOB c/o R foot pain but agreeable. X2 seated rest breaks d/t increased pain in  R foot during hallway ambulation. Required to be rolled back in chair, returned back to bed where settled. Call bell left in reach and bed alarm on.   Frederico Hamman Mobility Specialist Please contact via SecureChat or  Rehab office at [phone]

## 2023-05-01 NOTE — Progress Notes (Signed)
[name]  ZOX:[id number] DOB: 11/09/1951 DOA: 04/25/2023 PCP: Patient, No Pcp Per    Brief Narrative:  72 year old with a history of poorly controlled COPD, peripheral vascular disease with chronic right hallux ulcer, recurrent UTIs, nephrolithiasis, and HTN who was brought to the ER by family member due to worsening of cough and shortness of breath.  Following admission she was found to be suffering with an ESBL E. coli urinary tract infection as well as bacteremia.  The patient has been seen in consultation by both infectious disease and urology.  Given the presence of a large urinary stone it has been suggested that she be treated with a full 14-day course of antibiotics.  Consultants:  Urology ID  Goals of Care:  Code Status: Full Code   DVT prophylaxis: Lovenox  Interim Hx: Nursing notes report patient was verbally aggressive to staff yesterday afternoon.  She is afebrile.  Vital signs are stable.  The patient is pleasant and interactive when I visit her.  She has no new complaints.  She reports that she continues to feel weak in general.  She reports poor appetite.  She denies chest pain or shortness of breath.  Assessment & Plan:  ESBL E. coli complicated UTI with bacteremia POA Complicated by presence of significant nephrolithiasis - changed from cefepime to meropenem given culture revealing ESBL E. Coli - care discussed with ID and Urology with the decision made to pursue 14 days of IV antibiotics given complication of her kidney stone and hydronephrosis - has clinically stabilized with directed IV antibiotic care - WBC has normalized - patient is afebrile -she is medically stable for disposition at this time but given her generalized weakness I do not feel she is safe to go home  2.2 cm right renal pelvis nephrolithiasis with mild upstream hydronephrosis Has previously been evaluated by Urology at [facility] - was re-evaluated by Urologist during this hospital stay -patient has been  educated that failure to remove the stone will make it very difficult to permanently clear her of a urinary tract infection, and could also lead to loss of the affected kidney if full obstruction were to occur - she tells me she does intend to follow-up with the urologist in the outpatient setting  Possible aspiration pneumonia/pneumonitis Symptoms have resolved with antibiotic therapy and supportive care  COPD Utilizes only albuterol MDI at home as needed - [facility] added this admission for maintenance therapy - stable at this time without active wheezing on exam  PVD with chronic nonhealing right hallux ulcer Stable on exam this admission -family reports weekly wound care with steady improvement in wounds of R foot - pt tells me she is followed in a wound care clinic in [place] -continue usual aspirin and Plavix  HTN Patient has asked to continue her usual home amlodipine and Coreg without adjustments made in her dose -blood pressure currently well-controlled  Acute kidney injury Baseline creatinine 0.8 -serum creatinine increased to 1.16 during his hospital stay -IV hydration was ordered by the attending physician but the patient refused -creatinine has normalized and is stable at this time  Hypokalemia Resolved  Antibiotic induced diarrhea No worrisome clinical features -continue probiotic and as needed Imodium   Family Communication: No family present at time of exam Disposition: Awaiting SNF placement for a rehab stay -is medically stable for discharge at this time   Objective: Blood pressure 120/72, pulse 77, temperature 98.6 F (37 C), temperature source Oral, resp. rate 19, height 5\' 3"  (1.6 m), weight  70.9 kg, SpO2 97 %.  Intake/Output Summary (Last 24 hours) at 05/01/2023 1016 Last data filed at 05/01/2023 0900 Gross per 24 hour  Intake 720 ml  Output 550 ml  Net 170 ml    Filed Weights   04/25/23 0941 04/25/23 1732  Weight: 72.6 kg 70.9 kg     Examination: General: No acute respiratory distress Lungs: Clear to auscultation bilaterally - no wheezing or crackles  Cardiovascular: Regular rate and rhythm without murmur  Abdomen: Nontender, nondistended, soft, bowel sounds positive, no rebound Extremities: No significant edema B LE   CBC: Recent Labs  Lab 04/27/23 0113 04/28/23 0850 04/29/23 0226 05/01/23 0324  WBC 16.0* 9.5 8.3 8.6  NEUTROABS 12.7* 7.2 5.5  --   HGB 10.7* 12.7 13.1 12.3  HCT 33.7* 39.6 39.1 37.0  MCV 81.6 80.2 78.8* 80.8  PLT 315 359 349 353    Basic Metabolic Panel: Recent Labs  Lab 04/28/23 0850 04/29/23 0226 05/01/23 0324  NA 130* 134* 136  K 3.5 3.7 3.6  CL 96* 104 102  CO2 23 22 24   GLUCOSE 138* 118* 113*  BUN 15 18 14   CREATININE 0.87 0.87 0.88  CALCIUM 8.7* 8.8* 9.0  MG 1.8 2.0  --     GFR: Estimated Creatinine Clearance: 54.6 mL/min (by C-G formula based on SCr of 0.88 mg/dL).   Scheduled Meds:  amLODipine  10 mg Oral QPM   aspirin EC  81 mg Oral Daily   carvedilol  12.5 mg Oral BID WC   Chlorhexidine Gluconate Cloth  6 each Topical Daily   clopidogrel  75 mg Oral Q breakfast   enoxaparin (LOVENOX) injection  40 mg Subcutaneous Q24H   loratadine  10 mg Oral Daily   melatonin  10 mg Oral QHS   mometasone-formoterol  2 puff Inhalation BID   saccharomyces boulardii  250 mg Oral BID   sodium chloride flush  10-40 mL Intracatheter Q12H   Continuous Infusions:  ertapenem 1,000 mg (04/30/23 1757)     LOS: 5 days   Lonia Blood, MD [facility] Office  [phone] Pager - Text Page per Loretha Stapler  If 7PM-7AM, please contact night-coverage per Amion 05/01/2023, 10:16 AM

## 2023-05-02 DIAGNOSIS — N3001 Acute cystitis with hematuria: Secondary | ICD-10-CM | POA: Diagnosis not present

## 2023-05-02 MED ORDER — QUETIAPINE FUMARATE 25 MG PO TABS
12.5000 mg | ORAL_TABLET | Freq: Every day | ORAL | Status: DC
  Administered 2023-05-02 – 2023-05-03 (×2): 12.5 mg via ORAL
  Filled 2023-05-02 (×2): qty 1

## 2023-05-02 MED ORDER — ALPRAZOLAM 0.25 MG PO TABS
0.2500 mg | ORAL_TABLET | Freq: Three times a day (TID) | ORAL | Status: DC | PRN
  Administered 2023-05-02 – 2023-05-05 (×4): 0.25 mg via ORAL
  Filled 2023-05-02: qty 1
  Filled 2023-05-02: qty 2
  Filled 2023-05-02 (×3): qty 1

## 2023-05-02 MED ORDER — ALBUTEROL SULFATE HFA 108 (90 BASE) MCG/ACT IN AERS
2.0000 | INHALATION_SPRAY | Freq: Four times a day (QID) | RESPIRATORY_TRACT | Status: DC | PRN
  Administered 2023-05-02 – 2023-05-07 (×4): 2 via RESPIRATORY_TRACT
  Filled 2023-05-02: qty 6.7

## 2023-05-02 NOTE — Progress Notes (Addendum)
[name]  ZOX:[id number] DOB: [birth year] DOA: 04/25/2023 PCP: Patient, No Pcp Per    Brief Narrative:  72 year old with a history of poorly controlled COPD, peripheral vascular disease with chronic right hallux ulcer, recurrent UTIs, nephrolithiasis, and HTN who was brought to the ER by family member due to worsening of cough and shortness of breath.  Following admission she was found to be suffering with an ESBL E. coli urinary tract infection as well as bacteremia.  The patient has been seen in consultation by both infectious disease and urology.  Given the presence of a large urinary stone it has been suggested that she be treated with a full 14-day course of antibiotics.  Consultants:  Urology ID  Goals of Care:  Code Status: Full Code   DVT prophylaxis: Lovenox  Interim Hx: Patient had a Tmax of 101.4 last night.  Otherwise her vital signs remained stable.  She has defervesced since that 1 peak.  When I first visited the patient this morning she was in relatively good spirits but casually mentioned to me that she was going to leave AMA.  I asked her to reconsider and explained why she needed to stay in the hospital, especially in the setting of a fever last night.  Later in the day I was contacted by the patient's nurse stating that the patient was intending to leave and that her daughter was here to pick her up.  I presented to the room to meet with her and her daughter.  We had a lengthy discussion.  Ultimately the patient agreed to stay in the hospital at least until Monday to allow [place] to monitor her for recurrent fever and with the understanding that should she have a recurrent fever she would need repeat imaging to rule out a renal abscess.  It is our hope that she will have a bed within a rehab facility and be stable for medical discharge Monday.  The patient's daughter was present and also agreed with this plan.  Given her fever last night I did not feel it was safe to discharge  her home just yet, and she does not have a rehab bed available at present.  Assessment & Plan:  ESBL E. coli complicated UTI with bacteremia POA Complicated by presence of significant nephrolithiasis - changed from cefepime to meropenem given culture revealing ESBL E. Coli - care discussed with ID and Urology with the decision made to pursue 14 days of IV antibiotics given complication of her kidney stone and hydronephrosis - has clinically stabilized with directed IV antibiotic care - WBC has normalized - patient is afebrile -she is medically stable for disposition at this time but given her generalized weakness I do not feel she is safe to go home - today is day 8 of abx tx -if fevers recur she will need repeat imaging of her kidneys  2.2 cm right renal pelvis nephrolithiasis with mild upstream hydronephrosis Has previously been evaluated by Urology at [facility] - was re-evaluated by Urologist during this hospital stay -patient has been educated that failure to remove the stone will make it very difficult to permanently clear her of a urinary tract infection, and could also lead to loss of the affected kidney if full obstruction were to occur - she tells me she does intend to follow-up with the urologist in the outpatient setting  Possible aspiration pneumonia/pneumonitis Symptoms have resolved with antibiotic therapy and supportive care  COPD Utilizes only albuterol MDI at home as needed - [facility]  added this admission for maintenance therapy - stable at this time without active wheezing on exam  PVD with chronic nonhealing right hallux ulcer Stable on exam this admission -family reports weekly wound care with steady improvement in wounds of R foot - pt tells me she is followed in a wound care clinic in [place] -continue usual aspirin and Plavix -avoid silver nitrate at request of patient/family (I fully agree with this recommendation)  HTN Patient has asked to continue her usual home amlodipine  and Coreg without adjustments made in her dose -blood pressure currently well-controlled  Acute kidney injury Baseline creatinine 0.8 -serum creatinine increased to 1.16 during his hospital stay -IV hydration was ordered by the attending physician but the patient refused -creatinine has normalized and is stable at this time  Hypokalemia Resolved  Antibiotic induced diarrhea No worrisome clinical features -continue probiotic and as needed Imodium   Family Communication: Spoke with daughter at bedside at length Disposition: Awaiting SNF placement for a rehab stay -not presently medically stable as she had a fever last night   Objective: Blood pressure (!) 125/59, pulse 80, temperature 98.9 F (37.2 C), temperature source Oral, resp. rate 18, height 5\' 3"  (1.6 m), weight 70.9 kg, SpO2 95 %.  Intake/Output Summary (Last 24 hours) at 05/02/2023 1051 Last data filed at 05/02/2023 0800 Gross per 24 hour  Intake 320 ml  Output 0 ml  Net 320 ml    Filed Weights   04/25/23 0941 04/25/23 1732  Weight: 72.6 kg 70.9 kg    Examination: General: No acute respiratory distress Lungs: Clear to auscultation bilaterally Cardiovascular: Regular rate and rhythm without murmur  Abdomen: Nontender, nondistended, soft, bowel sounds positive, no rebound Extremities: No significant edema B lower extremities  CBC: Recent Labs  Lab 04/27/23 0113 04/28/23 0850 04/29/23 0226 05/01/23 0324  WBC 16.0* 9.5 8.3 8.6  NEUTROABS 12.7* 7.2 5.5  --   HGB 10.7* 12.7 13.1 12.3  HCT 33.7* 39.6 39.1 37.0  MCV 81.6 80.2 78.8* 80.8  PLT 315 359 349 353    Basic Metabolic Panel: Recent Labs  Lab 04/28/23 0850 04/29/23 0226 05/01/23 0324  NA 130* 134* 136  K 3.5 3.7 3.6  CL 96* 104 102  CO2 23 22 24   GLUCOSE 138* 118* 113*  BUN 15 18 14   CREATININE 0.87 0.87 0.88  CALCIUM 8.7* 8.8* 9.0  MG 1.8 2.0  --     GFR: Estimated Creatinine Clearance: 54.6 mL/min (by C-G formula based on SCr of 0.88  mg/dL).   Scheduled Meds:  amLODipine  10 mg Oral QPM   aspirin EC  81 mg Oral Daily   carvedilol  12.5 mg Oral BID WC   Chlorhexidine Gluconate Cloth  6 each Topical Daily   clopidogrel  75 mg Oral Q breakfast   enoxaparin (LOVENOX) injection  40 mg Subcutaneous Q24H   loratadine  10 mg Oral Daily   melatonin  10 mg Oral QHS   mometasone-formoterol  2 puff Inhalation BID   saccharomyces boulardii  250 mg Oral BID   sodium chloride flush  10-40 mL Intracatheter Q12H     LOS: 6 days   Lonia Blood, MD [facility] Office  [phone] Pager - Text Page per Loretha Stapler  If 7PM-7AM, please contact night-coverage per Amion 05/02/2023, 10:51 AM

## 2023-05-02 NOTE — Progress Notes (Signed)
PT Cancellation Note  Patient Details Name: Khaylee Puccini MRN: [id number] DOB: 13-Sep-1951   Cancelled Treatment:    Reason Eval/Treat Not Completed: (P) Other (comment);Medical issues which prohibited therapy, hold therapies today per RN. Will check back Monday to continue with PT POC.   Lenora Boys. PTA Acute Rehabilitation Services Office: [phone]    Catalina Antigua 05/02/2023, 3:17 PM

## 2023-05-02 NOTE — TOC Transition Note (Addendum)
Transition of Care [facility]) - CM/SW Discharge Note   Patient Details  Name: [name] MRN: [id number] Date of Birth: 1951/06/06  [facility]) CM/SW Contact:  Gordy Clement, RN Phone Number: 05/02/2023, 3:13 PM   Clinical Narrative:   UPDATE Patient has decided NOT to Leave   Patient insisting on leaving today and will not allow PICC to be removed. She is stating she will probably go somewhere else and does not wasn't to pay for another PICC line  CM did chart review and see no indications of IVDU which would be an indication PICC should come out. PICC will remain . Home Health is arranged with [facility] for PICC care. CM will notify [facility] .     Barriers to Discharge: Continued Medical Work up   Patient Goals and CMS Choice CMS Medicare.gov Compare Post Acute Care list provided to:: Patient Choice offered to / list presented to : Patient  Discharge Placement                         Discharge Plan and Services Additional resources added to the After Visit Summary for     Discharge Planning Services: CM Consult            DME Arranged: N/A DME Agency: NA       HH Arranged: RN, IV Antibiotics HH Agency: [facility], [facility] Date [facility] Agency Contacted: 04/28/23 Time HH Agency Contacted: 1230 Representative spoke with at [facility] Agency: Pam with [facility] for IV abx and Kandee Keen with [place] for [facility].  Social Determinants of Health (SDOH) Interventions SDOH Screenings   Food Insecurity: No Food Insecurity (04/25/2023)  Housing: Low Risk  (04/25/2023)  Transportation Needs: No Transportation Needs (04/25/2023)  Utilities: Not At Risk (04/25/2023)  Tobacco Use: Medium Risk (04/25/2023)     Readmission Risk Interventions    04/28/2023    4:20 PM  Readmission Risk Prevention Plan  Post Dischage Appt Complete  Medication Screening Complete  Transportation Screening Complete

## 2023-05-03 DIAGNOSIS — N3001 Acute cystitis with hematuria: Secondary | ICD-10-CM | POA: Diagnosis not present

## 2023-05-03 LAB — CBC
HCT: 36.1 % (ref 36.0–46.0)
Hemoglobin: 11.4 g/dL — ABNORMAL LOW (ref 12.0–15.0)
MCH: 26 pg (ref 26.0–34.0)
MCHC: 31.6 g/dL (ref 30.0–36.0)
MCV: 82.4 fL (ref 80.0–100.0)
Platelets: 322 10*3/uL (ref 150–400)
RBC: 4.38 MIL/uL (ref 3.87–5.11)
RDW: 13.9 % (ref 11.5–15.5)
WBC: 6.9 10*3/uL (ref 4.0–10.5)
nRBC: 0.3 % — ABNORMAL HIGH (ref 0.0–0.2)

## 2023-05-03 LAB — COMPREHENSIVE METABOLIC PANEL
ALT: 15 U/L (ref 0–44)
AST: 17 U/L (ref 15–41)
Albumin: 2.6 g/dL — ABNORMAL LOW (ref 3.5–5.0)
Alkaline Phosphatase: 60 U/L (ref 38–126)
Anion gap: 9 (ref 5–15)
BUN: 11 mg/dL (ref 8–23)
CO2: 24 mmol/L (ref 22–32)
Calcium: 8.8 mg/dL — ABNORMAL LOW (ref 8.9–10.3)
Chloride: 101 mmol/L (ref 98–111)
Creatinine, Ser: 0.86 mg/dL (ref 0.44–1.00)
GFR, Estimated: >60 mL/min (ref >60)
Glucose, Bld: 114 mg/dL — ABNORMAL HIGH (ref 70–99)
Potassium: 4.2 mmol/L (ref 3.5–5.1)
Sodium: 134 mmol/L — ABNORMAL LOW (ref 135–145)
Total Bilirubin: 0.5 mg/dL (ref 0.3–1.2)
Total Protein: 6.6 g/dL (ref 6.5–8.1)

## 2023-05-03 NOTE — Progress Notes (Signed)
Patient requested not to be disturbed wants her labs drawn when she is fully awake

## 2023-05-03 NOTE — Progress Notes (Signed)
[name]  WUJ:[id number] DOB: Sep 12, 1951 DOA: 04/25/2023 PCP: Patient, No Pcp Per    Brief Narrative:  72 year old with a history of poorly controlled COPD, peripheral vascular disease with chronic right hallux ulcer, recurrent UTIs, nephrolithiasis, and HTN who was brought to the ER by family member due to worsening of cough and shortness of breath.  Following admission she was found to be suffering with an ESBL E. coli urinary tract infection as well as bacteremia.  The patient has been seen in consultation by both infectious disease and urology.  Given the presence of a large urinary stone it has been suggested that she be treated with a full 14-day course of antibiotics.  Consultants:  Urology ID  Goals of Care:  Code Status: Full Code   DVT prophylaxis: Lovenox  Interim Hx: No acute events reported overnight.  At the time of my visit the patient says she needs to use the bathroom urgently and is awaiting assistance from the nurse.  She has no other new complaints.  She denies recurrent subjective fever.  Assessment & Plan:  ESBL E. coli complicated UTI with bacteremia POA Complicated by presence of significant nephrolithiasis - changed from cefepime to meropenem given culture revealing ESBL E. Coli - care discussed with ID and Urology with the decision made to pursue 14 days of IV antibiotics given complication of her kidney stone and hydronephrosis - has clinically stabilized with directed IV antibiotic care - WBC has normalized - patient is afebrile today - given her generalized weakness I do not feel she is safe to go home - today is day 9 of abx tx -normal WBC today and lack of recurrence of fever today reassuring -continue current treatment without change for now  2.2 cm right renal pelvis nephrolithiasis with mild upstream hydronephrosis Has previously been evaluated by Urology at [facility] - was re-evaluated by Urologist during this hospital stay -patient has been educated that  failure to remove the stone will make it very difficult to permanently clear her of a urinary tract infection, and could also lead to loss of the affected kidney if full obstruction were to occur - she tells me she does intend to follow-up with the urologist in the outpatient setting  Possible aspiration pneumonia/pneumonitis Symptoms have resolved with antibiotic therapy and supportive care  COPD Utilizes only albuterol MDI at home as needed - [facility] added this admission for maintenance therapy - stable at this time   PVD with chronic nonhealing right hallux ulcer Stable on exam this admission -family reports weekly wound care with steady improvement in wounds of R foot - pt tells me she is followed in a wound care clinic in [place] -continue usual aspirin and Plavix -avoid silver nitrate at request of patient/family (I fully agree with this recommendation)  HTN Patient has asked to continue her usual home amlodipine and Coreg without adjustments made in her dose -blood pressure currently well-controlled  Acute kidney injury Baseline creatinine 0.8 -serum creatinine increased to 1.16 during his hospital stay -IV hydration was ordered by the attending physician but the patient refused -creatinine has normalized and is stable at this time  Hypokalemia Resolved  Antibiotic induced diarrhea No worrisome clinical features -continue probiotic and as needed Imodium   Family Communication: No family present at time of visit today Disposition: Awaiting SNF placement for a rehab stay -not presently medically stable as she had a fever last night   Objective: Blood pressure (!) 115/58, pulse 72, temperature 98 F (36.7 C),  temperature source Oral, resp. rate 16, height 5\' 3"  (1.6 m), weight 70.9 kg, SpO2 91 %.  Intake/Output Summary (Last 24 hours) at 05/03/2023 1200 Last data filed at 05/03/2023 0900 Gross per 24 hour  Intake 700 ml  Output --  Net 700 ml    Filed Weights   04/25/23  0941 04/25/23 1732  Weight: 72.6 kg 70.9 kg    Examination: General: No acute respiratory distress -further exam deferred today to allow patient privacy to use the bathroom  CBC: Recent Labs  Lab 04/27/23 0113 04/28/23 0850 04/29/23 0226 05/01/23 0324 05/03/23 1050  WBC 16.0* 9.5 8.3 8.6 6.9  NEUTROABS 12.7* 7.2 5.5  --   --   HGB 10.7* 12.7 13.1 12.3 11.4*  HCT 33.7* 39.6 39.1 37.0 36.1  MCV 81.6 80.2 78.8* 80.8 82.4  PLT 315 359 349 353 322    Basic Metabolic Panel: Recent Labs  Lab 04/28/23 0850 04/29/23 0226 05/01/23 0324 05/03/23 1050  NA 130* 134* 136 134*  K 3.5 3.7 3.6 4.2  CL 96* 104 102 101  CO2 23 22 24 24   GLUCOSE 138* 118* 113* 114*  BUN 15 18 14 11   CREATININE 0.87 0.87 0.88 0.86  CALCIUM 8.7* 8.8* 9.0 8.8*  MG 1.8 2.0  --   --     GFR: Estimated Creatinine Clearance: 55.8 mL/min (by C-G formula based on SCr of 0.86 mg/dL).   Scheduled Meds:  amLODipine  10 mg Oral QPM   aspirin EC  81 mg Oral Daily   carvedilol  12.5 mg Oral BID WC   Chlorhexidine Gluconate Cloth  6 each Topical Daily   clopidogrel  75 mg Oral Q breakfast   enoxaparin (LOVENOX) injection  40 mg Subcutaneous Q24H   loratadine  10 mg Oral Daily   melatonin  10 mg Oral QHS   mometasone-formoterol  2 puff Inhalation BID   QUEtiapine  12.5 mg Oral QHS   saccharomyces boulardii  250 mg Oral BID   sodium chloride flush  10-40 mL Intracatheter Q12H     LOS: 7 days   Lonia Blood, MD [facility] Office  [phone] Pager - Text Page per Loretha Stapler  If 7PM-7AM, please contact night-coverage per Amion 05/03/2023, 12:00 PM

## 2023-05-03 NOTE — Plan of Care (Signed)
  Problem: Clinical Measurements: Goal: Ability to maintain clinical measurements within normal limits will improve Outcome: Progressing Goal: Will remain free from infection Outcome: Progressing Goal: Diagnostic test results will improve Outcome: Progressing Goal: Respiratory complications will improve Outcome: Progressing Goal: Cardiovascular complication will be avoided Outcome: Progressing   Problem: Activity: Goal: Risk for activity intolerance will decrease Outcome: Progressing   Problem: Nutrition: Goal: Adequate nutrition will be maintained Outcome: Progressing   Problem: Coping: Goal: Level of anxiety will decrease Outcome: Progressing   Problem: Elimination: Goal: Will not experience complications related to bowel motility Outcome: Progressing Goal: Will not experience complications related to urinary retention Outcome: Progressing   Problem: Pain Managment: Goal: General experience of comfort will improve Outcome: Progressing   

## 2023-05-04 ENCOUNTER — Inpatient Hospital Stay (HOSPITAL_COMMUNITY): Payer: Medicare Other

## 2023-05-04 DIAGNOSIS — N3001 Acute cystitis with hematuria: Secondary | ICD-10-CM | POA: Diagnosis not present

## 2023-05-04 MED ORDER — IOHEXOL 350 MG/ML SOLN
75.0000 mL | Freq: Once | INTRAVENOUS | Status: AC | PRN
  Administered 2023-05-04: 75 mL via INTRAVENOUS

## 2023-05-04 MED ORDER — IOHEXOL 9 MG/ML PO SOLN
500.0000 mL | ORAL | Status: AC
  Administered 2023-05-04 (×2): 500 mL via ORAL

## 2023-05-04 MED ORDER — QUETIAPINE FUMARATE 25 MG PO TABS
25.0000 mg | ORAL_TABLET | Freq: Every day | ORAL | Status: DC
  Administered 2023-05-04 – 2023-05-06 (×3): 25 mg via ORAL
  Filled 2023-05-04 (×3): qty 1

## 2023-05-04 NOTE — Progress Notes (Signed)
[name]  ZOX:[id number] DOB: Mar 30, 1951 DOA: 04/25/2023 PCP: Patient, No Pcp Per    Brief Narrative:  72 year old with a history of poorly controlled COPD, peripheral vascular disease with chronic right hallux ulcer, recurrent UTIs, nephrolithiasis, and HTN who was brought to the ER by family member due to worsening of cough and shortness of breath.  Following admission she was found to be suffering with an ESBL E. coli urinary tract infection as well as bacteremia.  The patient has been seen in consultation by both infectious disease and urology.  Given the presence of a large urinary stone it has been suggested that she be treated with a full 14-day course of antibiotics.  Consultants:  Urology ID  Goals of Care:  Code Status: Full Code   DVT prophylaxis: Lovenox  Interim Hx: No acute events recorded since the time of my last visit.  Afebrile.  Vital signs stable.  No new labs were requested for today.  Tmax over last 24 hours 99.6.  The patient has no new complaints at the time my visit.  I spoke with the patient about her clinical situation and explained that given her recurrent low-grade temperature elevations into the 99 range that I felt it would be prudent to obtain a CT of the abdomen and pelvis to rule out a complication such as an abscess.  She initially agreed with me that this was appropriate, but then changed her mind and said she would talk to her daughter first because "all Hospitalists are liars and I do not trust you."  She also demanded that "the Urologist be there when I get my scan."  I explained to her that this is not how it works but that if there is a complication found on the CT scan that it certainly could be discussed with her Urologist.  I received confirmation from her nurse later in the day that the patient had spoken with her daughter and agreed to having a CT scan.  Assessment & Plan:  ESBL E. coli complicated UTI with bacteremia POA Complicated by  presence of significant nephrolithiasis - changed from cefepime to meropenem given culture revealing ESBL E. Coli - care discussed with ID and Urology with the decision made to pursue 14 days of IV antibiotics given complication of her kidney stone and hydronephrosis - has clinically stabilized with directed IV antibiotic care - WBC has normalized - given her generalized weakness I do not feel she is safe to go home but instead feel a rehab stay is most appropriate - today is day 10 of abx tx - as discussed above I am checking a CT abd/pelvis to re-evaluate her urinary tract in light of intermittent temps into the 99 range, as well as an isolated peak of 101.4 on 05/01/23  2.2 cm right renal pelvis nephrolithiasis with mild upstream hydronephrosis Has previously been evaluated by Urology at [facility] - was re-evaluated by Urologist during this hospital stay -patient has been educated that failure to remove the stone will make it very difficult to permanently clear her of a urinary tract infection, and could also lead to loss of the affected kidney if full obstruction were to occur - she tells me she does intend to follow-up with the Urologist in the outpatient setting  Possible aspiration pneumonia/pneumonitis Symptoms have resolved with antibiotic therapy and supportive care  COPD Utilizes only albuterol MDI at home as needed - Dulera added this admission for maintenance therapy - stable at this time   PVD  with chronic nonhealing right hallux ulcer Stable on exam this admission - family reports weekly wound care as outpt with steady improvement in wounds of R foot - pt tells me she is followed in a wound care clinic in [place] -continue usual aspirin and Plavix -avoid silver nitrate at request of patient/family (I fully agree with this recommendation - no orders have ever been entered including silver nitrate to my knowledge) -I attempted to inspect the wounds of the foot again today but she refused to  allow me to remove the dressing therefore I simply examined the foot that was not covered by the dressing which included the posterior portion of the foot and the ankle which were unrevealing  HTN Patient has asked to continue her usual home amlodipine and Coreg without adjustments made in her dose -blood pressure currently well-controlled  Acute kidney injury Baseline creatinine 0.8 -serum creatinine increased to peak of 1.16 during his hospital stay -IV hydration was ordered by the attending physician but the patient refused -creatinine has normalized and is stable at this time  Hypokalemia Resolved  Antibiotic induced diarrhea No worrisome clinical features -continue probiotic and as needed Imodium   Family Communication: No family present at time of visit today Disposition: plan is for SNF placement for a rehab stay once medically clear    Objective: Blood pressure 115/62, pulse 68, temperature 98.4 F (36.9 C), temperature source Oral, resp. rate 17, height 5\' 3"  (1.6 m), weight 70.9 kg, SpO2 93 %.  Intake/Output Summary (Last 24 hours) at 05/04/2023 1009 Last data filed at 05/04/2023 0800 Gross per 24 hour  Intake 720 ml  Output --  Net 720 ml    Filed Weights   04/25/23 0941 04/25/23 1732  Weight: 72.6 kg 70.9 kg    Examination: General: No acute respiratory distress Lungs: Clear to auscultation bilaterally -no wheezing or crackles Cardiovascular: Regular rate and rhythm without murmur  Abdomen: Nontender, nondistended, soft, bowel sounds positive, no rebound, Extremities: No significant edema bilateral lower extremities   CBC: Recent Labs  Lab 04/28/23 0850 04/29/23 0226 05/01/23 0324 05/03/23 1050  WBC 9.5 8.3 8.6 6.9  NEUTROABS 7.2 5.5  --   --   HGB 12.7 13.1 12.3 11.4*  HCT 39.6 39.1 37.0 36.1  MCV 80.2 78.8* 80.8 82.4  PLT 359 349 353 322    Basic Metabolic Panel: Recent Labs  Lab 04/28/23 0850 04/29/23 0226 05/01/23 0324 05/03/23 1050  NA  130* 134* 136 134*  K 3.5 3.7 3.6 4.2  CL 96* 104 102 101  CO2 23 22 24 24   GLUCOSE 138* 118* 113* 114*  BUN 15 18 14 11   CREATININE 0.87 0.87 0.88 0.86  CALCIUM 8.7* 8.8* 9.0 8.8*  MG 1.8 2.0  --   --     GFR: Estimated Creatinine Clearance: 55.8 mL/min (by C-G formula based on SCr of 0.86 mg/dL).   Scheduled Meds:  amLODipine  10 mg Oral QPM   aspirin EC  81 mg Oral Daily   carvedilol  12.5 mg Oral BID WC   Chlorhexidine Gluconate Cloth  6 each Topical Daily   clopidogrel  75 mg Oral Q breakfast   enoxaparin (LOVENOX) injection  40 mg Subcutaneous Q24H   loratadine  10 mg Oral Daily   melatonin  10 mg Oral QHS   mometasone-formoterol  2 puff Inhalation BID   QUEtiapine  12.5 mg Oral QHS   saccharomyces boulardii  250 mg Oral BID   sodium chloride flush  10-40 mL Intracatheter Q12H     LOS: 8 days   Lonia Blood, MD [facility] Office  [phone] Pager - Text Page per Amion  If 7PM-7AM, please contact night-coverage per Amion 05/04/2023, 10:09 AM

## 2023-05-04 NOTE — Progress Notes (Signed)
Mobility Specialist Progress Note   05/04/23 1204  Mobility  Activity Ambulated with assistance in hallway  Level of Assistance Contact guard assist, steadying assist  Assistive Device Four wheel walker (Rollator)  Distance Ambulated (ft) 64 ft  Activity Response Tolerated well  Mobility Referral Yes  $Mobility charge 1 Mobility  Mobility Specialist Start Time (ACUTE ONLY) 1120  Mobility Specialist Stop Time (ACUTE ONLY) 1200  Mobility Specialist Time Calculation (min) (ACUTE ONLY) 40 min   Received pt in bed expressing they weren't "feeling well" but not specifying why. Despite unwell feeling pt agreeable to mobility w/ encouragement. Able to get OOB w/ supervision and ambulate at [facility] Pt c/o L foot pain throughout, as pain progressed proximity from Rollator and flexion increased. When cue'd pt expressing that they can't fix it. Returned back to EOB w/o fault, call bell in reach and all needs met.     Frederico Hamman Mobility Specialist Please contact via SecureChat or  Rehab office at [phone]

## 2023-05-04 NOTE — Progress Notes (Signed)
PT Cancellation Note  Patient Details Name: [name] MRN: [id number] DOB: 03/12/1951   Cancelled Treatment:    Reason Eval/Treat Not Completed: Patient declined, no reason specified. Pt adamantly refused to participate with PT treatment session. Declined OOB, ambulation, and bed level exercise. Pt reports she has to use the bathroom but refused to go with PT as she does not have a dry brief to put on after. Pt educated on need to remove wet brief but pt declined. PT exited and NT notified after pt approaching agitation. Will continue to follow.    Marylynn Pearson 05/04/2023, 2:44 PM  Conni Slipper, PT, DPT Acute Rehabilitation Services Secure Chat Preferred Office: [phone]

## 2023-05-05 DIAGNOSIS — N3001 Acute cystitis with hematuria: Secondary | ICD-10-CM | POA: Diagnosis not present

## 2023-05-05 LAB — COMPREHENSIVE METABOLIC PANEL
ALT: 16 U/L (ref 0–44)
AST: 18 U/L (ref 15–41)
Albumin: 2.8 g/dL — ABNORMAL LOW (ref 3.5–5.0)
Alkaline Phosphatase: 70 U/L (ref 38–126)
Anion gap: 9 (ref 5–15)
BUN: 11 mg/dL (ref 8–23)
CO2: 24 mmol/L (ref 22–32)
Calcium: 9.1 mg/dL (ref 8.9–10.3)
Chloride: 104 mmol/L (ref 98–111)
Creatinine, Ser: 1.02 mg/dL — ABNORMAL HIGH (ref 0.44–1.00)
GFR, Estimated: 58 mL/min — ABNORMAL LOW (ref >60)
Glucose, Bld: 177 mg/dL — ABNORMAL HIGH (ref 70–99)
Potassium: 4 mmol/L (ref 3.5–5.1)
Sodium: 137 mmol/L (ref 135–145)
Total Bilirubin: 0.3 mg/dL (ref 0.3–1.2)
Total Protein: 6.6 g/dL (ref 6.5–8.1)

## 2023-05-05 LAB — URINALYSIS, W/ REFLEX TO CULTURE (INFECTION SUSPECTED)
Bilirubin Urine: NEGATIVE
Glucose, UA: NEGATIVE mg/dL
Ketones, ur: NEGATIVE mg/dL
Nitrite: NEGATIVE
Protein, ur: NEGATIVE mg/dL
RBC / HPF: >50 RBC/hpf (ref 0–5)
Specific Gravity, Urine: 1.018 (ref 1.005–1.030)
pH: 7 (ref 5.0–8.0)

## 2023-05-05 LAB — CBC
HCT: 38.7 % (ref 36.0–46.0)
Hemoglobin: 11.9 g/dL — ABNORMAL LOW (ref 12.0–15.0)
MCH: 25.3 pg — ABNORMAL LOW (ref 26.0–34.0)
MCHC: 30.7 g/dL (ref 30.0–36.0)
MCV: 82.3 fL (ref 80.0–100.0)
Platelets: 354 10*3/uL (ref 150–400)
RBC: 4.7 MIL/uL (ref 3.87–5.11)
RDW: 13.9 % (ref 11.5–15.5)
WBC: 4.8 10*3/uL (ref 4.0–10.5)
nRBC: 0 % (ref 0.0–0.2)

## 2023-05-05 LAB — MAGNESIUM: Magnesium: 2.2 mg/dL (ref 1.7–2.4)
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
## 2023-05-05 NOTE — Progress Notes (Signed)
[name]  WUJ:[id number] DOB: Dec 05, 1951 DOA: 04/25/2023 PCP: Patient, No Pcp Per    Brief Narrative:  72 year old with a history of poorly controlled COPD, peripheral vascular disease with chronic right hallux ulcer, recurrent UTIs, nephrolithiasis, and HTN who was brought to the ER by family member due to worsening of cough and shortness of breath.  Following admission she was found to be suffering with an ESBL E. coli urinary tract infection as well as bacteremia.  The patient has been seen in consultation by both infectious disease and urology.  Given the presence of a large urinary stone it has been suggested that she be treated with a full 14-day course of antibiotics.  Consultants:  Urology ID  Goals of Care:  Code Status: Full Code   DVT prophylaxis: Lovenox  Interim Hx: No acute events recorded overnight.  Highest recorded temperature in last 24 hours was 98.8.  WBC remains normal at 4.8.  Sitting on bedside commode the time of my visit but patient gives me permission to proceed with interview.  She has no new complaints today.  She reports that she has been sleeping well.   I discussed the findings of her CT scan with her, her fever curve, and her declining white count.  I explained that I wished to complete her course of IV antibiotics and then repeat imaging of her kidneys.  If there is evidence of persisting inflammation after she is done with her 14 days of antibiotics then we will need to ask urology to comment further.  Clinically however she appears to be improving nicely.  She voiced understanding of this plan and agreed.  She also explained to me that she wanted to get out of this hospital as soon as possible.  I told her that I felt she was safe to go to a rehab facility and to complete her IV antibiotics there and that if a bed becomes available before her antibiotics are completed that we could proceed with discharge with home health antibiotics at the facility and  follow-up imaging in the outpatient setting.  She voiced understanding and agreed with this plan.  I received a message from the patient's nurse that the patient's daughter desired to call.  The patient herself gave me permission to speak with the daughter.  I called the number provided but there was no answer.  Assessment & Plan:  ESBL E. coli complicated UTI with bacteremia POA Complicated by presence of significant nephrolithiasis - changed from cefepime to meropenem given culture revealing ESBL E. Coli - care discussed with ID and Urology with the decision made to pursue 14 days of IV antibiotics given complication of her kidney stone and hydronephrosis - has clinically stabilized with directed IV antibiotic care - WBC has normalized - given her generalized weakness I do not feel she is safe to go home but instead feel a rehab stay is most appropriate - today is day 11 of abx tx - CT abd/pelvis obtained 5/13 to re-evaluate her urinary tract in light of intermittent temps into the 99 range as well as an isolated peak of 101.4 on 05/01/23 reconfirm left-sided pyelonephritis and a 12 x 6 x 11 mm calculus in the right renal pelvis with mild right-sided hydronephrosis and wall enhancement of the right renal pelvis collecting system and proximal ureter with mild surrounding inflammation concerning for pyelitis as well as additional nonobstructing right renal calculi - plan to continue current IV abx and recheck CT abd/pelvis when course completed as  discussed above with patient in agreement  2.2 cm right renal pelvis nephrolithiasis with mild upstream hydronephrosis Has previously been evaluated by Urology at [facility] - was re-evaluated by Urologist during this hospital stay -patient has been educated that failure to remove the stone will make it very difficult to permanently clear her of a urinary tract infection, and could also lead to loss of the affected kidney if full obstruction were to occur - she tells me  she does intend to follow-up with the Urologist in the outpatient setting, but she was not willing to undergo further evaluation or treatment presently when she last discussed it with Urology 04/29/2023 -as noted above if follow-up imaging following antibiotic therapy suggest ongoing inflammation patient reports that she would then be amenable to repeat urologic evaluation for advice  Possible aspiration pneumonia/pneumonitis Symptoms have resolved with antibiotic therapy and supportive care  COPD Utilizes only albuterol MDI at home as needed - Dulera added this admission for maintenance therapy - stable at this time   PVD with chronic nonhealing right hallux ulcer Stable on exam this admission - family reports weekly wound care as outpt with steady improvement in wounds of R foot - pt tells me she is followed in a wound care clinic in [place] -continue usual aspirin and Plavix -avoid silver nitrate at request of patient/family (I fully agree with this recommendation - no orders have ever been entered including silver nitrate to my knowledge) -I attempted to inspect the wounds of the foot again today but she refused to allow me to remove the dressing therefore I simply examined the foot that was not covered by the dressing which included the posterior portion of the foot and the ankle which were unrevealing  HTN Patient has asked to continue her usual home amlodipine and Coreg without adjustments made in her dose -blood pressure currently well-controlled  Acute kidney injury Baseline creatinine 0.8 -serum creatinine increased to peak of 1.16 during his hospital stay -IV hydration was ordered by the attending physician but the patient refused -creatinine has normalized and is stable at this time  Hypokalemia Resolved  Antibiotic induced diarrhea No worrisome clinical features -continue probiotic and as needed Imodium   Family Communication: No family present at time of visit today -called  number listed in secure chat from nurse or daughter but received no answer Disposition: plan is for SNF placement for a rehab stay -patient has a PICC line and is clinically stable enough to discharge to rehab facility to complete her antibiotic therapy at said facility   Objective: Blood pressure 97/67, pulse 73, temperature 98.3 F (36.8 C), temperature source Oral, resp. rate 18, height 5\' 3"  (1.6 m), weight 70.9 kg, SpO2 94 %.  Intake/Output Summary (Last 24 hours) at 05/05/2023 1025 Last data filed at 05/04/2023 2104 Gross per 24 hour  Intake 460 ml  Output 0 ml  Net 460 ml    Filed Weights   04/25/23 0941 04/25/23 1732  Weight: 72.6 kg 70.9 kg    Examination: General: No acute respiratory distress Lungs: Clear to auscultation bilaterally with no wheezing Cardiovascular: Regular rate and rhythm without murmur  Abdomen: Nontender, nondistended, soft, bowel sounds positive, no rebound, Extremities: No significant edema B LE    CBC: Recent Labs  Lab 04/29/23 0226 05/01/23 0324 05/03/23 1050 05/05/23 0949  WBC 8.3 8.6 6.9 4.8  NEUTROABS 5.5  --   --   --   HGB 13.1 12.3 11.4* 11.9*  HCT 39.1 37.0 36.1 38.7  MCV 78.8* 80.8 82.4 82.3  PLT 349 353 322 354    Basic Metabolic Panel: Recent Labs  Lab 04/29/23 0226 05/01/23 0324 05/03/23 1050  NA 134* 136 134*  K 3.7 3.6 4.2  CL 104 102 101  CO2 22 24 24   GLUCOSE 118* 113* 114*  BUN 18 14 11   CREATININE 0.87 0.88 0.86  CALCIUM 8.8* 9.0 8.8*  MG 2.0  --   --     GFR: Estimated Creatinine Clearance: 55.8 mL/min (by C-G formula based on SCr of 0.86 mg/dL).   Scheduled Meds:  amLODipine  10 mg Oral QPM   aspirin EC  81 mg Oral Daily   carvedilol  12.5 mg Oral BID WC   Chlorhexidine Gluconate Cloth  6 each Topical Daily   clopidogrel  75 mg Oral Q breakfast   enoxaparin (LOVENOX) injection  40 mg Subcutaneous Q24H   loratadine  10 mg Oral Daily   melatonin  10 mg Oral QHS   mometasone-formoterol  2 puff  Inhalation BID   QUEtiapine  25 mg Oral QHS   saccharomyces boulardii  250 mg Oral BID   sodium chloride flush  10-40 mL Intracatheter Q12H     LOS: 9 days   Lonia Blood, MD [facility] Office  [phone] Pager - Text Page per Loretha Stapler  If 7PM-7AM, please contact night-coverage per Amion 05/05/2023, 10:25 AM

## 2023-05-05 NOTE — Progress Notes (Signed)
Physical Therapy Treatment Patient Details Name: [name] MRN: [id number] DOB: August 21, 1951 Today's Date: 05/05/2023   History of Present Illness 72 y.o. female presents to [facility] hospital on 04/25/2023 with UTI and presumed aspiration PNA. PMH includes COPD, PVD, HTN, kidney stones.    PT Comments    Pt greeted resting in bed and agreeable to session, however limited by fatigue and bil LE pain and needing increased cues for safety for all mobility this session. Pt grossly min guard throughout mobility with rollator support with gait distance limited by pt stated fatigue. Pt with flexed trunk throughout gait, resting forearms on rollator and with tendency to drift outside of rollator to R needing cues to re-center self for safety. Pt continues to benefit from skilled PT services to progress toward functional mobility goals.    Recommendations for follow up therapy are one component of a multi-disciplinary discharge planning process, led by the attending physician.  Recommendations may be updated based on patient status, additional functional criteria and insurance authorization.  Follow Up Recommendations       Assistance Recommended at Discharge PRN  Patient can return home with the following A little help with bathing/dressing/bathroom;Assistance with cooking/housework;Assist for transportation;Help with stairs or ramp for entrance   Equipment Recommendations  None recommended by PT    Recommendations for Other Services       Precautions / Restrictions Precautions Precautions: Fall Restrictions Weight Bearing Restrictions: No     Mobility  Bed Mobility Overal bed mobility: Modified Independent             General bed mobility comments: HOB elevated, increased time (pt entering bed at end of session on all 4s despite max cues to turn and sit with pt refusing)    Transfers Overall transfer level: Needs assistance Equipment used: Rollator (4 wheels) Transfers: Sit to/from  Stand Sit to Stand: Supervision           General transfer comment: flexed tunk on rise despite cues    Ambulation/Gait Ambulation/Gait assistance: Supervision, Min guard Gait Distance (Feet): 120 Feet Assistive device: Rollator (4 wheels) Gait Pattern/deviations: Step-through pattern Gait velocity: reduced     General Gait Details: slowed step-through gait, increased trunk flexion with pt keeping forearms on rollator despite cues to place hands on supports with pt refusing, cues for centering self in RW with pt becoming aggitated and cursing with any and all cues for safety   Stairs             Wheelchair Mobility    Modified Rankin (Stroke Patients Only)       Balance Overall balance assessment: Needs assistance Sitting-balance support: No upper extremity supported, Feet supported Sitting balance-Leahy Scale: Good     Standing balance support: Single extremity supported, Reliant on assistive device for balance Standing balance-Leahy Scale: Poor Standing balance comment: reliant on at least single UE supprot during dynamic tasks                            Cognition Arousal/Alertness: Awake/alert Behavior During Therapy: WFL for tasks assessed/performed, Agitated (aggitated and cursing in response to all safety cues) Overall Cognitive Status: Within Functional Limits for tasks assessed                                          Exercises      General  Comments General comments (skin integrity, edema, etc.): VSS on RA      Pertinent Vitals/Pain Pain Assessment Pain Assessment: Faces Faces Pain Scale: Hurts a little bit Pain Location: back, arms Pain Descriptors / Indicators: Sore Pain Intervention(s): Monitored during session, Limited activity within patient's tolerance    Home Living                          Prior Function            PT Goals (current goals can now be found in the care plan section) Acute  Rehab PT Goals PT Goal Formulation: With patient Time For Goal Achievement: 05/12/23 Progress towards PT goals: Progressing toward goals    Frequency    Min 3X/week      PT Plan Current plan remains appropriate    Co-evaluation              AM-PAC PT "6 Clicks" Mobility   Outcome Measure  Help needed turning from your back to your side while in a flat bed without using bedrails?: None Help needed moving from lying on your back to sitting on the side of a flat bed without using bedrails?: None Help needed moving to and from a bed to a chair (including a wheelchair)?: A Little Help needed standing up from a chair using your arms (e.g., wheelchair or bedside chair)?: A Little Help needed to walk in hospital room?: A Little Help needed climbing 3-5 steps with a railing? : A Little 6 Click Score: 20    End of Session Equipment Utilized During Treatment: Gait belt Activity Tolerance: Patient limited by fatigue;Patient limited by pain Patient left: in bed;with bed alarm set;with call bell/phone within reach Nurse Communication: Mobility status PT Visit Diagnosis: Other abnormalities of gait and mobility (R26.89);Muscle weakness (generalized) (M62.81)     Time: 1610-9604 PT Time Calculation (min) (ACUTE ONLY): 15 min  Charges:  $Gait Training: 8-22 mins                     Victoria Redmon R. PTA Acute Rehabilitation Services Office: [phone]    Catalina Antigua 05/05/2023, 10:21 AM

## 2023-05-06 DIAGNOSIS — R7881 Bacteremia: Secondary | ICD-10-CM | POA: Diagnosis not present

## 2023-05-06 DIAGNOSIS — J42 Unspecified chronic bronchitis: Secondary | ICD-10-CM | POA: Diagnosis not present

## 2023-05-06 DIAGNOSIS — B9620 Unspecified Escherichia coli [E. coli] as the cause of diseases classified elsewhere: Secondary | ICD-10-CM

## 2023-05-06 DIAGNOSIS — N200 Calculus of kidney: Secondary | ICD-10-CM

## 2023-05-06 DIAGNOSIS — N3001 Acute cystitis with hematuria: Secondary | ICD-10-CM | POA: Diagnosis not present

## 2023-05-06 DIAGNOSIS — I2510 Atherosclerotic heart disease of native coronary artery without angina pectoris: Secondary | ICD-10-CM

## 2023-05-06 DIAGNOSIS — J4489 Other specified chronic obstructive pulmonary disease: Secondary | ICD-10-CM

## 2023-05-06 DIAGNOSIS — G9340 Encephalopathy, unspecified: Secondary | ICD-10-CM

## 2023-05-06 DIAGNOSIS — Z1612 Extended spectrum beta lactamase (ESBL) resistance: Secondary | ICD-10-CM

## 2023-05-06 NOTE — Consult Note (Signed)
[facility]    Date of Admission:  04/25/2023      Total days of antibiotics 10   Ertapenem        Reason for Consult: ESBL E coli bacteremia 2/2 obstructing pyelonephritis     Referring Provider: D[place]al Primary Care Provider: Patient, No Pcp Per   Assessment: [name] is a 72 y.o. female with COPD, CAD, PVD, known large right parenchymal renal stone admitted with initially shortness of breath, found to have ESBL producing E Coli bacteremia related to obstructing renal calculi.   ESBL E Coli Bacteremia -  Defervesce and normalized wbc count. PICC In place with plan for 4 more days of IV abx as recommended by urology team. No clinical findings of ongoing bacteremia at this time.  Secondary to nidus of renal calculi that remains.   2.2 cm Right Renal Parenchymal Stone -  Recurrent illness (albeit long interval between last episode in 2019) due to this problem. Reviewed notes from 2019/2020 care - she was treated with IV ertapenem then placed on Macrobid for suppressive attempt x 78m until stent was removed in July 2020. She was offered ureteroscopy with lithotripsy however due to COVID and other concerns for co-morbidities with heart/lungs this was not done. At the time her urine culture revealed it was susceptible to macrobid, however urine culture from the isolate now is resistant. We explained that there are no oral options that are left to suppress this. Would not advocate for a prolonged course of IV ertapenem either as this will lead to further resistance and difficulty to treat her when she becomes ill with this organism again. It is very likely her stone is colonized with this bacteria and antibiotics alone are not definitive answer here. She and her daughter under stand that.  We discussed options and will do 14-d course of treatment then monitor off antibiotics with follow up urology team of their comfort/preference. Advised that she would need to  return to hospital if there was any concern for relapsing infection given no oral options for treatment - hard to predict a correct timeline of what to expect given she lasted so long between episodes.    Plan: Continue IV ertapenem for 14d total  Has urology follow up  set for June 1st from what her daughter tells [place]    OPAT ORDERS:  Diagnosis:Bacteremia 2/2 complicated obstructing pyelonephritis   Culture Result: ESBL E Coli   Allergies  Allergen Reactions   Penicillins Rash   Oxycodone    Vicodin [Hydrocodone-Acetaminophen]      Discharge antibiotics to be given via PICC line: Ertapenem 1 gm IV Daily    Duration: 14d total   End Date: 05/10/2023  [facility] Care Per Protocol with [employer]op[employer] Use: Home health RN for IV administration and teaching, line care and labs.    Labs weekly while on IV antibiotics: _x_ CBC with differential __ BMP **TWICE WEEKLY ON VANCOMYCIN  _x_ CMP __ CRP __ ESR __ Vancomycin trough TWICE WEEKLY __ CK  _x_ Please pull PIC at completion of IV antibiotics __ Please leave PIC in place until doctor has seen patient or been notified  Fax weekly labs to [phone]  Clinic Follow Up Appt: Urology team     Principal Problem:   UTI (urinary tract infection) Active Problems:   Acute encephalopathy   E coli bacteremia   ESBL (extended spectrum beta-lactamase) producing bacteria infection   Nephrolithiasis  amLODipine  10 mg Oral QPM   aspirin EC  81 mg Oral Daily   carvedilol  12.5 mg Oral BID WC   Chlorhexidine Gluconate Cloth  6 each Topical Daily   clopidogrel  75 mg Oral Q breakfast   enoxaparin (LOVENOX) injection  40 mg Subcutaneous Q24H   loratadine  10 mg Oral Daily   melatonin  10 mg Oral QHS   mometasone-formoterol  2 puff Inhalation BID   QUEtiapine  25 mg Oral QHS   saccharomyces boulardii  250 mg Oral BID   sodium chloride flush  10-40 mL Intracatheter Q12H    HPI: [name] is a 72 y.o. female admitted from  home with weakness, shortness of breath. She thought she was having an asthma flare at first. Later found to have   PMHx: poorly controlled COPD, PVD with chronic right hallux ulcer, recurrent UTIs and known stone in the right renal pelvis with hydronephrosis.   [name] states she was very sick feeling awful upon presentation to the hospital. She initially thought she was having an asthma flare at first. She says she had an episode similar to this in 2019 where she had secondary bacteremia from ESBL producing E Coli due to complicated urinary tract infection --> Imaging revealed 2.2 cm nephrolithiasis in right renal pelvis with upstream hydronephrosis. She has resolved leukocytosis on IV ertapenem. She has had 10 days of active antibiotics covering her bacteria.   She lives in [place] area. 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   Her daughter has an appointment with [facility] Urology team 6/01 that her daughter tells [place] about. Her daughter says that last time she was given oral antibiotics for 3 months after 3 weeks IV ertapenem at SNF for this problem. Urology at that time recommended ureteroscopy with lithotripsy of the stone from what her daughter says which was concerning d/t her other medical problems (CAD, COPD) and this did not occur. She had a stent at this time but that was removed a long time ago (I think in July 2020 based on notes). Last time she saw her urologist was in 2020. [name] feels more comfortable with care managed at [facility].    Review of Systems: Review of Systems  Constitutional:  Negative for chills, diaphoresis, fever and malaise/fatigue.  Respiratory:  Positive for cough.   Gastrointestinal:  Negative for abdominal pain, nausea and vomiting.  Genitourinary:  Negative for dysuria, flank pain, frequency and urgency.     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 rate 18, height 5\' 3"  (1.6 m), weight 70.9 kg, SpO2 100 %.  Physical Exam Vitals reviewed.  Constitutional:  Appearance: She is not ill-appearing.  Cardiovascular:     Rate and Rhythm: Normal rate and regular rhythm.  Neurological:     Mental Status: She is alert.     Lab Results Lab Results  Component Value Date   WBC 4.8 05/05/2023   HGB 11.9 (L) 05/05/2023   HCT 38.7 05/05/2023   MCV 82.3 05/05/2023   PLT 354 05/05/2023    Lab Results  Component Value Date   CREATININE 1.02 (H) 05/05/2023   BUN 11 05/05/2023   NA 137 05/05/2023   K 4.0 05/05/2023   CL 104 05/05/2023   CO2 24 05/05/2023    Lab Results  Component Value Date   ALT 16 05/05/2023   AST 18 05/05/2023   ALKPHOS 70 05/05/2023   BILITOT 0.3 05/05/2023     Microbiology: No results found for this or any previous visit (from the past 240 hour(s)).   Rexene Alberts, MSN, NP-C [facility] for Infectious Disease [facility]  [email or web address] Pager: [phone] Office: [phone] [facility] Main Line: [phone] *Secure Chat Communication Welcome   Total Encounter Time: 45 minutes

## 2023-05-06 NOTE — TOC Progression Note (Addendum)
Transition of Care [facility]) - Initial/Assessment Note    Patient Details  Name: [name] MRN: [id number] Date of Birth: [birth year]  [facility] of [facility] CM/SW Contact:    Ralene Bathe, LCSWA Phone Number: 05/06/2023, 11:16 AM  Clinical Narrative:                 LCSW contacted [facility] to secure bed offer.  The facility rescinded the bed offer.  LCSW contacted the patient's daughter to inform of the denial.  Patient's daughter requested [facility] in [place].  LCSW sent referral.  LCSW was contacted by the patient's daughter and informed that [facility] SNF is no longer an option and requested that LCSW send referral to [facility] rehabilitation.  LCSW sent referral to [facility] rehabilitation.  LCSW called admissions for [facility] rehabilitation and is awaiting a returned call.  Patient's daughter, [name], also requested a call from MD.  MD informed of the above.  Addendum 14:15[facility] can accept the patient.  MD and patient's daughter informed.  Patient's daughter is in agreement.  TOC following.  Expected Discharge Plan: Home w Home Health Services Barriers to Discharge: Continued Medical Work up   Patient Goals and CMS Choice Patient states their goals for this hospitalization and ongoing recovery are:: To return home CMS Medicare.gov Compare Post Acute Care list provided to:: Patient Choice offered to / list presented to : Patient      Expected Discharge Plan and Services   Discharge Planning Services: CM Consult   Living arrangements for the past 2 months: Single Family Home                 DME Arranged: N/A DME Agency: NA       HH Arranged: RN, IV Antibiotics HH Agency: [facility] Date [facility] Agency Contacted: 04/28/23 Time HH Agency Contacted: 1230 Representative spoke with at [facility] Agency: Pam with [facility] for IV abx and Kandee Keen with [place] for [employer].  Prior Living Arrangements/Services Living arrangements for the past  2 months: Single Family Home Lives with:: Self Patient language and need for interpreter reviewed:: Yes Do you feel safe going back to the place where you live?: Yes      Need for Family Participation in Patient Care: Yes (Comment) Care giver support system in place?: Yes (comment)   Criminal Activity/Legal Involvement Pertinent to Current Situation/Hospitalization: No - Comment as needed  Activities of Daily Living Home Assistive Devices/Equipment: Wheelchair, Environmental consultant (specify type) ADL Screening (condition at time of admission) Patient's cognitive ability adequate to safely complete daily activities?: Yes Is the patient deaf or have difficulty hearing?: No Does the patient have difficulty seeing, even when wearing glasses/contacts?: No Does the patient have difficulty concentrating, remembering, or making decisions?: No Patient able to express need for assistance with ADLs?: Yes Does the patient have difficulty dressing or bathing?: Yes Independently performs ADLs?: No Communication: Independent Dressing (OT): Needs assistance Is this a change from baseline?: Pre-admission baseline Grooming: Needs assistance Is this a change from baseline?: Pre-admission baseline Feeding: Independent Is this a change from baseline?: Pre-admission baseline Bathing: Needs assistance Is this a change from baseline?: Pre-admission baseline Toileting: Dependent Is this a change from baseline?: Pre-admission baseline In/Out Bed: Needs assistance Is this a change from baseline?: Pre-admission baseline Walks in Home: Needs assistance Is this a change from baseline?: Pre-admission baseline Does the patient have difficulty walking or climbing stairs?: Yes Weakness of Legs: Both Weakness of Arms/Hands: None  Permission Sought/Granted Permission  sought to share information with : Case Manager, Magazine features editor, Family Supports Permission granted to share information with : Yes, Verbal  Permission Granted              Emotional Assessment Appearance:: Appears stated age Attitude/Demeanor/Rapport: Engaged, Gracious Affect (typically observed): Accepting, Appropriate, Calm, Hopeful, Pleasant Orientation: : Oriented to Self, Oriented to Place, Oriented to  Time, Oriented to Situation Alcohol / Substance Use: Not Applicable Psych Involvement: No (comment)  Admission diagnosis:  UTI (urinary tract infection) [N39.0] Urinary tract infection with hematuria, site unspecified [N39.0, R31.9] Pneumonia of both lungs due to infectious organism, unspecified part of lung [J18.9] Sepsis without acute organ dysfunction, due to unspecified organism (HCC) [A41.9] E coli bacteremia [R78.81, B96.20] Patient Active Problem List   Diagnosis Date Noted   ESBL (extended spectrum beta-lactamase) producing bacteria infection 04/29/2023   Nephrolithiasis 04/29/2023   E coli bacteremia 04/26/2023   UTI (urinary tract infection) 04/25/2023   Acute encephalopathy 04/25/2023   PVD (peripheral vascular disease) (HCC) 01/07/2023   Peripheral neuropathy 01/07/2023   Cellulitis of great toe of right foot 01/06/2023   PCP:  Patient, No Pcp Per Pharmacy:   [facility] [address] [facility] - [place], [place] - [address] AT [facility] [address] [place] [address] Phone: [phone] Fax: [phone]     Social Determinants of Health (SDOH) Social History: SDOH Screenings   Food Insecurity: No Food Insecurity (04/25/2023)  Housing: Low Risk  (04/25/2023)  Transportation Needs: No Transportation Needs (04/25/2023)  Utilities: Not At Risk (04/25/2023)  Tobacco Use: Medium Risk (04/25/2023)   SDOH Interventions: Transportation Interventions: Intervention Not Indicated, Patient Resources (Friends/Family), Inpatient TOC   Readmission Risk Interventions    04/28/2023    4:20 PM  Readmission Risk Prevention Plan  Post Dischage Appt Complete  Medication Screening Complete   Transportation Screening Complete

## 2023-05-06 NOTE — Progress Notes (Signed)
PROGRESS NOTE  [name]  DOB: 09-20-1951  PCP: Patient, No Pcp Per ZOX:[id number]  DOA: 04/25/2023  LOS: 10 days  Hospital Day: 12  Brief narrative: [name] is a 72 y.o. female with PMH significant for HTN, poorly controlled COPD, CAD, PAD, chronic right hallux ulcer, recurrent UTI, nephrolithiasis. 5/4, patient was brought to the ED for worsening cough, shortness of breath.  On workup, she was noted to have positive urinalysis.   Ultrasound renal showed a 2.2 cm nephrolithiasis within the right renal pelvis with mild upstream hydronephrosis. Admitted to [facility] on antibiotics Urine culture and blood culture sent on admission grew ESBL E. coli See below for details  Subjective: Patient was seen and examined this afternoon.  Elderly Caucasian female.  Propped up in bed. Not in physical distress.  Visibly upset as soon as I entered her room and started cursing. I called her daughter [name] from bedside.  Chart reviewed In the last 24 hours, afebrile, blood pressure low in 90s Last set of labs from yesterday morning with no significant change except for uptick in creatinine.  Assessment and plan: Complicated UTI and bacteremia with ESBL E. coli  Right nephrolithiasis with hydronephrosis Urology and ID consults were obtained  Currently on IV ertapenem. ID consult appreciated. Given the presence of a large stone, it has been suggested that she be treated with a full 14-day course of IV antibiotics, EOT 5/18.  PICC line inserted on 5/8.  PICC line to be removed after completion of antibiotics. WBC count has normalized.  No fever last 24 hours at least Recent Labs  Lab 05/01/23 0324 05/03/23 1050 05/05/23 0949  WBC 8.6 6.9 4.8   Right nephrolithiasis with hydronephrosis   Imaging showed 2.2 cm right renal pelvis nephrolithiasis with mild upstream hydronephrosis Per previous hospitalist recommendation, has previously been evaluated by Urology at [facility] - was re-evaluated by  Urologist during this hospital stay -patient has been educated that failure to remove the stone will make it very difficult to permanently clear her of a urinary tract infection, and could also lead to loss of the affected kidney if full obstruction were to occur -she states she does intend to follow-up with the Urologist in the outpatient setting, but she was not willing to undergo further evaluation or treatment presently when she last discussed it with Urology 04/29/2023 -as noted above if follow-up imaging following antibiotic therapy suggest ongoing inflammation patient reports that she would then be amenable to repeat urologic evaluation for advice. She has a follow-up urology appointment on May 31 at [place] with Dr. [name]    Acute kidney injury Baseline creatinine 0.8 -serum creatinine increased to peak of 1.16 during his hospital stay IV hydration was recommended but patient refused.  Encourage oral hydration.  Continue to monitor creatinine Recent Labs    01/06/23 2309 01/07/23 0432 01/11/23 0450 04/25/23 0950 04/27/23 0113 04/28/23 0850 04/29/23 0226 05/01/23 0324 05/03/23 1050 05/05/23 0949  BUN 19 16 17 16 15 15 18 14 11 11   CREATININE 0.86 0.77 0.82 1.17* 1.16* 0.87 0.87 0.88 0.86 1.02*   HTN Blood pressure controlled on home dose of amlodipine and Coreg    COPD Utilizes only albuterol MDI at home as needed Dulera added this admission for maintenance therapy stable respiratory status at this time    PVD with chronic nonhealing right hallux ulcer family reports weekly wound care as outpt with steady improvement in wounds of R foot patient follows up with wound care clinic in  [place] continue usual aspirin and Plavix -avoid silver nitrate at request of patient/family  Antibiotic induced diarrhea No worrisome clinical features -continue probiotic and as needed Imodium  Generalized weakness Impaired mobility PT eval obtained.  SNF recommended  Behavioral  issues Patient is constantly cursing at MD and staff.  I mentioned to her and her daughter that because of her bad behavior, she has hard time getting SNF approved.     Goals of care   Code Status: Full Code     DVT prophylaxis:  enoxaparin (LOVENOX) injection 40 mg Start: 04/25/23 1545   Antimicrobials: IV ertapenem Fluid: None Consultants: ID, urology Family Communication: Daughter Ms. [name] on the phone  Status: Inpatient Level of care:  Med-Surg   Patient from: Home Anticipated d/c to: SNF Needs to continue in-hospital care:  SNF pending.    Diet:  Diet Order             Diet regular Room service appropriate? Yes; Fluid consistency: Thin  Diet effective now                   Scheduled Meds:  amLODipine  10 mg Oral QPM   aspirin EC  81 mg Oral Daily   carvedilol  12.5 mg Oral BID WC   Chlorhexidine Gluconate Cloth  6 each Topical Daily   clopidogrel  75 mg Oral Q breakfast   enoxaparin (LOVENOX) injection  40 mg Subcutaneous Q24H   loratadine  10 mg Oral Daily   melatonin  10 mg Oral QHS   mometasone-formoterol  2 puff Inhalation BID   QUEtiapine  25 mg Oral QHS   saccharomyces boulardii  250 mg Oral BID   sodium chloride flush  10-40 mL Intracatheter Q12H    PRN meds: acetaminophen, albuterol, ALPRAZolam, benzonatate, guaiFENesin, loperamide, ondansetron **OR** ondansetron (ZOFRAN) IV, sodium chloride flush   Infusions:   ertapenem 1,000 mg (05/05/23 1754)    Antimicrobials: Anti-infectives (From admission, onward)    Start     Dose/Rate Route Frequency Ordered Stop   04/28/23 1400  meropenem (MERREM) 1 g in sodium chloride 0.9 % 100 mL IVPB  Status:  Discontinued        1 g 200 mL/hr over 30 Minutes Intravenous Every 8 hours 04/28/23 1052 04/28/23 1109   04/28/23 1200  ertapenem (INVANZ) 1,000 mg in sodium chloride 0.9 % 100 mL IVPB        1 g 200 mL/hr over 30 Minutes Intravenous Every 24 hours 04/28/23 1109 05/09/23 1659   04/27/23 0100   vancomycin (VANCOREADY) IVPB 1250 mg/250 mL  Status:  Discontinued        1,250 mg 166.7 mL/hr over 90 Minutes Intravenous Every 36 hours 04/25/23 1146 04/25/23 1533   04/26/23 0400  meropenem (MERREM) 1 g in sodium chloride 0.9 % 100 mL IVPB  Status:  Discontinued        1 g 200 mL/hr over 30 Minutes Intravenous Every 12 hours 04/26/23 0249 04/28/23 1052   04/26/23 0100  ceFEPIme (MAXIPIME) 2 g in sodium chloride 0.9 % 100 mL IVPB  Status:  Discontinued        2 g 200 mL/hr over 30 Minutes Intravenous Every 12 hours 04/25/23 1146 04/25/23 1533   04/25/23 1545  cefTRIAXone (ROCEPHIN) 1 g in sodium chloride 0.9 % 100 mL IVPB  Status:  Discontinued        1 g 200 mL/hr over 30 Minutes Intravenous Every 24 hours 04/25/23 1534 04/26/23 0249  04/25/23 1545  azithromycin (ZITHROMAX) tablet 500 mg  Status:  Discontinued        500 mg Oral Daily 04/25/23 1540 04/26/23 0249   04/25/23 1145  aztreonam (AZACTAM) 2 g in sodium chloride 0.9 % 100 mL IVPB  Status:  Discontinued        2 g 200 mL/hr over 30 Minutes Intravenous  Once 04/25/23 1139 04/25/23 1140   04/25/23 1145  metroNIDAZOLE (FLAGYL) IVPB 500 mg        500 mg 100 mL/hr over 60 Minutes Intravenous  Once 04/25/23 1139 04/25/23 1303   04/25/23 1145  vancomycin (VANCOCIN) IVPB 1000 mg/200 mL premix  Status:  Discontinued        1,000 mg 200 mL/hr over 60 Minutes Intravenous  Once 04/25/23 1139 04/25/23 1141   04/25/23 1145  ceFEPIme (MAXIPIME) 2 g in sodium chloride 0.9 % 100 mL IVPB        2 g 200 mL/hr over 30 Minutes Intravenous  Once 04/25/23 1141 04/25/23 1240   04/25/23 1145  vancomycin (VANCOREADY) IVPB 1500 mg/300 mL        1,500 mg 150 mL/hr over 120 Minutes Intravenous  Once 04/25/23 1141 04/25/23 1616       Nutritional status:  Body mass index is 27.69 kg/m.          Objective: Vitals:   05/06/23 0921 05/06/23 1608  BP: 113/63 120/66  Pulse: 70 68  Resp: 18 18  Temp: 98.2 F (36.8 C)   SpO2: 100% 100%     Intake/Output Summary (Last 24 hours) at 05/06/2023 1628 Last data filed at 05/06/2023 1443 Gross per 24 hour  Intake 310 ml  Output 450 ml  Net -140 ml   Filed Weights   04/25/23 0941 04/25/23 1732  Weight: 72.6 kg 70.9 kg   Weight change:  Body mass index is 27.69 kg/m.   Physical Exam: General exam: Elderly Caucasian female.  Not in physical distress Skin: No rashes, lesions or ulcers. HEENT: Atraumatic, normocephalic, no obvious bleeding Lungs: Clear to auscultation bilaterally CVS: Regular rate and rhythm, no murmur GI/Abd soft, nontender, nondistended, bowel sound present CNS: Alert, awake, Psychiatry: Upset, cursing all the time.  No apparent reason Extremities: No pedal edema, no calf redness  Data Review: I have personally reviewed the laboratory data and studies available.  F/u labs ordered Unresulted Labs (From admission, onward)     Start     Ordered   05/05/23 1603  Urine Culture  Once,   AD        05/05/23 1603            Total time spent in review of labs and imaging, patient evaluation, formulation of plan, documentation and communication with family: 55 minutes  Signed, Lorin Glass, MD [facility] 05/06/2023

## 2023-05-06 NOTE — Progress Notes (Signed)
Mobility Specialist Progress Note   05/06/23 1650  Mobility  Activity Ambulated with assistance in hallway  Level of Assistance Contact guard assist, steadying assist  Assistive Device Four wheel walker (Rollator)  Distance Ambulated (ft) 64 ft  Activity Response Tolerated well  Mobility Referral Yes  $Mobility charge 1 Mobility  Mobility Specialist Start Time (ACUTE ONLY) 1650  Mobility Specialist Stop Time (ACUTE ONLY) 1710  Mobility Specialist Time Calculation (min) (ACUTE ONLY) 20 min   Pt having having no c/o L foot pain today and agreeable. Pt presenting w/ decrease activity tolerance and fatiguing quickly. Despite cues given on safety, pt deciding to lean on Rollator while ambulating back to room. Despite proper mechanics given, pt also decides to enter bed anteriorly instead of sitting EOB, stand by assist for safety. Left in bed w/ call bell in reach and bed alarm on.   Frederico Hamman Mobility Specialist Please contact via SecureChat or  Rehab office at [phone]

## 2023-05-07 DIAGNOSIS — N3001 Acute cystitis with hematuria: Secondary | ICD-10-CM | POA: Diagnosis not present

## 2023-05-07 LAB — URINE CULTURE: Culture: NO GROWTH

## 2023-05-07 MED ORDER — MOMETASONE FURO-FORMOTEROL FUM 200-5 MCG/ACT IN AERO: 2.0000 | INHALATION_SPRAY | Freq: Two times a day (BID) | RESPIRATORY_TRACT | Status: DC

## 2023-05-07 MED ORDER — SACCHAROMYCES BOULARDII 250 MG PO CAPS: 250.0000 mg | ORAL_CAPSULE | Freq: Two times a day (BID) | ORAL | Status: AC

## 2023-05-07 MED ORDER — BENZONATATE 100 MG PO CAPS: 100.0000 mg | ORAL_CAPSULE | Freq: Three times a day (TID) | ORAL | 0 refills | Status: DC | PRN

## 2023-05-07 MED ORDER — LOPERAMIDE HCL 2 MG PO CAPS: 2.0000 mg | ORAL_CAPSULE | ORAL | 0 refills | Status: DC | PRN

## 2023-05-07 MED ORDER — QUETIAPINE FUMARATE 25 MG PO TABS: 25.0000 mg | ORAL_TABLET | Freq: Every day | ORAL | Status: DC

## 2023-05-07 MED ORDER — GUAIFENESIN ER 600 MG PO TB12: 1200.0000 mg | ORAL_TABLET | Freq: Two times a day (BID) | ORAL | Status: DC | PRN

## 2023-05-07 MED ORDER — ERTAPENEM IV (FOR PTA / DISCHARGE USE ONLY)
1.0000 g | INTRAVENOUS | 0 refills | Status: AC
Start: 2023-05-07 — End: 2023-05-17

## 2023-05-07 NOTE — Progress Notes (Signed)
PHARMACY CONSULT NOTE FOR:  OUTPATIENT  PARENTERAL ANTIBIOTIC THERAPY (OPAT)  Indication: ESBL bacteremia, UTI, presence of kidney stone Regimen: ertapenem 1g IV q24 hours End date: 05/09/23  IV antibiotic discharge orders are pended. To discharging provider:  please sign these orders via discharge navigator,  Select New Orders & click on the button choice - Manage This Unsigned Work.     Thank you for allowing pharmacy to be a part of this patient's care.  Rexford Maus, PharmD, BCPS 05/07/2023 1:21 PM

## 2023-05-07 NOTE — TOC Transition Note (Addendum)
Transition of Care [facility]) - CM/SW Discharge Note   Patient Details  Name: [name] MRN: [id number] Date of Birth: [birth year]  Transition of Care [facility]) CM/SW Contact:  Ralene Bathe, LCSWA Phone Number: 05/07/2023, 3:25 PM   Clinical Narrative:    Patient will DC to: [facility]  Anticipated DC date: 05/07/2023 Family notified: Yes Transport by: [facility]   Per MD patient ready for DC to SNF. RN to call report prior to discharge [phone] room 502). RN, patient, patient's family, and facility notified of DC. Discharge Summary sent to facility. DC packet on chart. Ambulance transport requested for patient.  Floor and charge RN informed that wheelchair cannot be transported with patient on ambulance and that the daughter will come to retrieve from nurses station.   CSW will sign off for now as social work intervention is no longer needed. Please consult [place] again if new needs arise.    Final next level of care: Skilled Nursing Facility Barriers to Discharge: Barriers Resolved   Patient Goals and CMS Choice CMS Medicare.gov Compare Post Acute Care list provided to:: Patient Choice offered to / list presented to : Patient  Discharge Placement                Patient chooses bed at:  [facility]) Patient to be transferred to facility by: [facility] Name of family member notified: Shamicka, Dworsky (Daughter) [phone] Patient and family notified of of transfer: 05/07/23  Discharge Plan and Services Additional resources added to the After Visit Summary for     Discharge Planning Services: CM Consult            DME Arranged: N/A DME Agency: NA       HH Arranged: RN, IV Antibiotics HH Agency: [facility], [facility] Date [facility] Agency Contacted: 04/28/23 Time HH Agency Contacted: 1230 Representative spoke with at [facility] Agency: Pam with [facility] for IV abx and Kandee Keen with [place] for [facility].  Social Determinants of Health (SDOH) Interventions SDOH  Screenings   Food Insecurity: No Food Insecurity (04/25/2023)  Housing: Low Risk  (04/25/2023)  Transportation Needs: No Transportation Needs (04/25/2023)  Utilities: Not At Risk (04/25/2023)  Tobacco Use: Medium Risk (04/25/2023)     Readmission Risk Interventions    04/28/2023    4:20 PM  Readmission Risk Prevention Plan  Post Dischage Appt Complete  Medication Screening Complete  Transportation Screening Complete

## 2023-05-07 NOTE — TOC Progression Note (Addendum)
Transition of Care [facility]) - Initial/Assessment Note    Patient Details  Name: [name] MRN: [id number] Date of Birth: February 15, 1951  Transition of Care [facility]) CM/SW Contact:    Ralene Bathe, LCSWA Phone Number: 05/07/2023, 1:47 PM  Clinical Narrative:                 Patient medically ready to discharge.  LCSW updated patient.  LCSW contacted Revonda Standard in admissions at [facility]) and is awaiting room number and number to call report.    14:00-  LCSW contacted patient's daughter to provide update.  LCSW also informed the daughter that [facility] is unable to transport the wheelchair so family will have to pick up the wheel chair.  LCSW informed daughter that the RN will be informed and the wheelchair will be left at the nurses station until family can arrive.   TOC following.   Expected Discharge Plan: Home w Home Health Services Barriers to Discharge: Continued Medical Work up   Patient Goals and CMS Choice Patient states their goals for this hospitalization and ongoing recovery are:: To return home CMS Medicare.gov Compare Post Acute Care list provided to:: Patient Choice offered to / list presented to : Patient      Expected Discharge Plan and Services   Discharge Planning Services: CM Consult   Living arrangements for the past 2 months: Single Family Home Expected Discharge Date: 05/07/23               DME Arranged: N/A DME Agency: NA       HH Arranged: RN, IV Antibiotics HH Agency: [facility], [facility] Date [facility] Agency Contacted: 04/28/23 Time HH Agency Contacted: 1230 Representative spoke with at [facility] Agency: Elita Quick with [facility] for IV abx and Kandee Keen with [place] for [facility].  Prior Living Arrangements/Services Living arrangements for the past 2 months: Single Family Home Lives with:: Self Patient language and need for interpreter reviewed:: Yes Do you feel safe going back to the place where you live?: Yes      Need for Family Participation in  Patient Care: Yes (Comment) Care giver support system in place?: Yes (comment)   Criminal Activity/Legal Involvement Pertinent to Current Situation/Hospitalization: No - Comment as needed  Activities of Daily Living Home Assistive Devices/Equipment: Wheelchair, Environmental consultant (specify type) ADL Screening (condition at time of admission) Patient's cognitive ability adequate to safely complete daily activities?: Yes Is the patient deaf or have difficulty hearing?: No Does the patient have difficulty seeing, even when wearing glasses/contacts?: No Does the patient have difficulty concentrating, remembering, or making decisions?: No Patient able to express need for assistance with ADLs?: Yes Does the patient have difficulty dressing or bathing?: Yes Independently performs ADLs?: No Communication: Independent Dressing (OT): Needs assistance Is this a change from baseline?: Pre-admission baseline Grooming: Needs assistance Is this a change from baseline?: Pre-admission baseline Feeding: Independent Is this a change from baseline?: Pre-admission baseline Bathing: Needs assistance Is this a change from baseline?: Pre-admission baseline Toileting: Dependent Is this a change from baseline?: Pre-admission baseline In/Out Bed: Needs assistance Is this a change from baseline?: Pre-admission baseline Walks in Home: Needs assistance Is this a change from baseline?: Pre-admission baseline Does the patient have difficulty walking or climbing stairs?: Yes Weakness of Legs: Both Weakness of Arms/Hands: None  Permission Sought/Granted Permission sought to share information with : Case Manager, Magazine features editor, Family Supports Permission granted to share information with : Yes, Verbal Permission Granted  Emotional Assessment Appearance:: Appears stated age Attitude/Demeanor/Rapport: Engaged, Gracious Affect (typically observed): Accepting, Appropriate, Calm, Hopeful,  Pleasant Orientation: : Oriented to Self, Oriented to Place, Oriented to  Time, Oriented to Situation Alcohol / Substance Use: Not Applicable Psych Involvement: No (comment)  Admission diagnosis:  UTI (urinary tract infection) [N39.0] Urinary tract infection with hematuria, site unspecified [N39.0, R31.9] Pneumonia of both lungs due to infectious organism, unspecified part of lung [J18.9] Sepsis without acute organ dysfunction, due to unspecified organism (HCC) [A41.9] E coli bacteremia [R78.81, B96.20] Patient Active Problem List   Diagnosis Date Noted   Chronic bronchitis (HCC) 05/06/2023   Coronary artery disease involving native coronary artery of native heart 05/06/2023   ESBL (extended spectrum beta-lactamase) producing bacteria infection 04/29/2023   Nephrolithiasis 04/29/2023   E coli bacteremia 04/26/2023   UTI (urinary tract infection) 04/25/2023   Acute encephalopathy 04/25/2023   PVD (peripheral vascular disease) (HCC) 01/07/2023   Peripheral neuropathy 01/07/2023   Cellulitis of great toe of right foot 01/06/2023   PCP:  Patient, No Pcp Per Pharmacy:   [facility] [address] [facility] - [place], [place] - [address] AT [facility] [address] [place] [address] Phone: [phone] Fax: [phone]     Social Determinants of Health (SDOH) Social History: SDOH Screenings   Food Insecurity: No Food Insecurity (04/25/2023)  Housing: Low Risk  (04/25/2023)  Transportation Needs: No Transportation Needs (04/25/2023)  Utilities: Not At Risk (04/25/2023)  Tobacco Use: Medium Risk (04/25/2023)   SDOH Interventions: Transportation Interventions: Intervention Not Indicated, Patient Resources (Friends/Family), Inpatient TOC   Readmission Risk Interventions    04/28/2023    4:20 PM  Readmission Risk Prevention Plan  Post Dischage Appt Complete  Medication Screening Complete  Transportation Screening Complete

## 2023-05-07 NOTE — Progress Notes (Signed)
Report called to [facility] to the receiving nurse [employer].

## 2023-05-07 NOTE — Plan of Care (Signed)

## 2023-05-07 NOTE — Discharge Summary (Signed)
Physician Discharge Summary  [name] WUJ:[id number] DOB: [birth year] DOA: 04/25/2023  PCP: Patient, No Pcp Per  Admit date: 04/25/2023 Discharge date: 05/07/2023  Admitted From: home Discharge disposition: SNF  Recommendations at discharge:  Continue IV ertapenem PICC line through 5/18. Need to follow-up with urology as an outpatient    Brief narrative: [name] is a 72 y.o. female with PMH significant for HTN, poorly controlled COPD, CAD, PAD, chronic right hallux ulcer, recurrent UTI, nephrolithiasis. 5/4, patient was brought to the ED for worsening cough, shortness of breath.  On workup, she was noted to have positive urinalysis.   Ultrasound renal showed a 2.2 cm nephrolithiasis within the right renal pelvis with mild upstream hydronephrosis. Admitted to [facility] on antibiotics Urine culture and blood culture sent on admission grew ESBL E. coli See below for details  Subjective: Patient was seen and examined this morning.  Lying on bed.  Not in distress no new symptoms.  Patient is aware of the plan of discharge to SNF today.  Assessment and plan: Complicated UTI and bacteremia with ESBL E. coli  Right nephrolithiasis with hydronephrosis Urology and ID consults were obtained  Currently on IV ertapenem. ID consult appreciated. Given the presence of a large stone, it has been suggested that she be treated with a full 14-day course of IV antibiotics, EOT 5/18.  PICC line inserted on 5/8.  PICC line to be removed after completion of antibiotics. WBC count has normalized.  No fever last 48 hours at least Recent Labs  Lab 05/01/23 0324 05/03/23 1050 05/05/23 0949  WBC 8.6 6.9 4.8   Right nephrolithiasis with hydronephrosis   Imaging showed 2.2 cm right renal pelvis nephrolithiasis with mild upstream hydronephrosis Per previous hospitalist recommendation, has previously been evaluated by Urology at [facility] - was re-evaluated by Urologist during this hospital stay -patient has  been educated that failure to remove the stone will make it very difficult to permanently clear her of a urinary tract infection, and could also lead to loss of the affected kidney if full obstruction were to occur -she states she does intend to follow-up with the Urologist in the outpatient setting, but she was not willing to undergo further evaluation or treatment presently when she last discussed it with Urology 04/29/2023 -as noted above if follow-up imaging following antibiotic therapy suggest ongoing inflammation patient reports that she would then be amenable to repeat urologic evaluation for advice. She has a follow-up urology appointment on May 31 at [place] with Dr. [name]    Acute kidney injury Baseline creatinine 0.8 -serum creatinine increased to peak of 1.16 during his hospital stay.  Gradually improved.  HTN Blood pressure controlled on home dose of amlodipine and Coreg    COPD Utilizes only albuterol MDI at home as needed [facility] added this admission for maintenance therapy stable respiratory status at this time    PVD with chronic nonhealing right hallux ulcer family reports weekly wound care as outpt with steady improvement in wounds of R foot patient follows up with wound care clinic in [place] continue usual aspirin and Plavix -avoid silver nitrate at request of patient/family  Antibiotic induced diarrhea No worrisome clinical features -continue probiotic and as needed Imodium  Generalized weakness Impaired mobility PT eval obtained.  SNF recommended   Goals of care   Code Status: Full Code   Wounds:  - Wound / Incision (Open or Dehisced) 01/06/23 Other (Comment) Toe (Comment  which one) Right;Other (Comment) necrotic tissue (Active)  Date First  Assessed/Time First Assessed: 01/06/23 2000   Wound Type: Other (Comment)  Location: Toe (Comment  which one)  Location Orientation: Right;Other (Comment)  Wound Description (Comments): necrotic tissue  Present on  Admission: Yes    Assessments 01/06/2023 11:00 PM 05/06/2023  9:00 AM  Dressing Type Non adherent;Gauze (Comment);Compression wrap Gauze (Comment)  Dressing Changed Changed Other (Comment)  Dressing Status Clean, Dry, Intact Clean, Dry, Intact  Dressing Change Frequency -- Daily  Site / Wound Assessment -- Dressing in place / Unable to assess  % Wound base Black/Eschar 95% --     No associated orders.    Discharge Exam:   Vitals:   05/06/23 1608 05/06/23 2034 05/07/23 0453 05/07/23 0855  BP: 120/66 115/68 (!) 107/57 (!) 102/51  Pulse: 68 70 65 71  Resp: 18 18 16 18   Temp: 98.5 F (36.9 C) 98.8 F (37.1 C) (!) 97.5 F (36.4 C) 98.4 F (36.9 C)  TempSrc:  Oral Oral Oral  SpO2: 100% 98% 94% 97%  Weight:      Height:        Body mass index is 27.69 kg/m.   General exam: Elderly Caucasian female.  Not in physical distress Skin: No rashes, lesions or ulcers. HEENT: Atraumatic, normocephalic, no obvious bleeding Lungs: Clear to auscultation bilaterally CVS: Regular rate and rhythm, no murmur GI/Abd soft, nontender, nondistended, bowel sound present CNS: Alert, awake, oriented x 3 Psychiatry: Mood appropriate this morning Extremities: No pedal edema, no calf redness  Follow ups:    Follow-up Information     [facility] Follow up.   Specialty: Home Health Services Why: Someone will cll you to schedule first home visit. Contact information: [address] [place] [address] [phone]         [facility] Follow up.   Why: Someone will call you to schedule first visit. Contact information: [address] [place], [place] [address]  [phone]        [facility] Follow up.   Contact information: [address] [place] [address] [phone]                Discharge Instructions:   Discharge Instructions     [facility] pharmacist to adjust dose for Vancomycin, Aminoglycosides and other anti-infective therapies as requested by physician.   Complete by: As directed    [facility] to provide Cath Flo 2mg    Complete by: As directed    Administer for PICC line occlusion and as ordered by physician for other access device issues.   Anaphylaxis Kit: Provided to treat any anaphylactic reaction to the medication being provided to the patient if First Dose or when requested by physician   Complete by: As directed    Epinephrine 1mg /ml vial / amp: Administer 0.3mg  (0.76ml) subcutaneously once for moderate to severe anaphylaxis, nurse to call physician and pharmacy when reaction occurs and call 911 if needed for immediate care   Diphenhydramine 50mg /ml IV vial: Administer 25-50mg  IV/IM PRN for first dose reaction, rash, itching, mild reaction, nurse to call physician and pharmacy when reaction occurs   Sodium Chloride 0.9% NS IV: Administer if needed for hypovolemic blood pressure drop or as ordered by physician after call to physician with anaphylactic reaction   Call MD for:  difficulty breathing, headache or visual disturbances   Complete by: As directed    Call MD for:  extreme fatigue  Complete by: As directed    Call MD for:  hives   Complete by: As directed    Call MD for:  persistant dizziness or light-headedness   Complete by: As directed    Call MD for:  persistant nausea and vomiting   Complete by: As directed    Call MD for:  severe uncontrolled pain   Complete by: As directed    Call MD for:  temperature >100.4   Complete by: As directed    Change dressing on IV access line weekly and PRN   Complete by: As directed    Diet general   Complete by: As directed    Discharge instructions   Complete by: As directed    Recommendations at discharge:   Continue IV ertapenem PICC line through 5/18.  Need to follow-up with urology as an outpatient  General discharge  instructions: Follow with Primary MD Patient, No Pcp Per in 7 days  Please request your PCP  to go over your hospital tests, procedures, radiology results at the follow up. Please get your medicines reviewed and adjusted.  Your PCP may decide to repeat certain labs or tests as needed. Do not drive, operate heavy machinery, perform activities at heights, swimming or participation in water activities or provide baby sitting services if your were admitted for syncope or siezures until you have seen by Primary MD or a Neurologist and advised to do so again. [place] Controlled Substance Reporting System database was reviewed. Do not drive, operate heavy machinery, perform activities at heights, swim, participate in water activities or provide baby-sitting services while on medications for pain, sleep and mood until your outpatient physician has reevaluated you and advised to do so again.  You are strongly recommended to comply with the dose, frequency and duration of prescribed medications. Activity: As tolerated with Full fall precautions use walker/cane & assistance as needed Avoid using any recreational substances like cigarette, tobacco, alcohol, or non-prescribed drug. If you experience worsening of your admission symptoms, develop shortness of breath, life threatening emergency, suicidal or homicidal thoughts you must seek medical attention immediately by calling 911 or calling your MD immediately  if symptoms less severe. You must read complete instructions/literature along with all the possible adverse reactions/side effects for all the medicines you take and that have been prescribed to you. Take any new medicine only after you have completely understood and accepted all the possible adverse reactions/side effects.  Wear Seat belts while driving. You were cared for by a hospitalist during your hospital stay. If you have any questions about your discharge medications or the care you received  while you were in the hospital after you are discharged, you can call the unit and ask to speak with the hospitalist or the covering physician. Once you are discharged, your primary care physician will handle any further medical issues. Please note that NO REFILLS for any discharge medications will be authorized once you are discharged, as it is imperative that you return to your primary care physician (or establish a relationship with a primary care physician if you do not have one).   Discharge wound care:   Complete by: As directed    Flush IV access with Sodium Chloride 0.9% and Heparin 10 units/ml or 100 units/ml   Complete by: As directed    Home infusion instructions - [facility]   Complete by: As directed    Instructions: Flush IV access with Sodium Chloride 0.9% and Heparin 10units/ml or 100units/ml  Change dressing on IV access line: Weekly and PRN   Instructions Cath Flo 2mg : Administer for PICC Line occlusion and as ordered by physician for other access device   [facility] pharmacist to adjust dose for: Vancomycin, Aminoglycosides and other anti-infective therapies as requested by physician   Increase activity slowly   Complete by: As directed    Method of administration may be changed at the discretion of home infusion pharmacist based upon assessment of the patient and/or caregiver's ability to self-administer the medication ordered   Complete by: As directed        Discharge Medications:   Allergies as of 05/07/2023       Reactions   Penicillins Rash   Oxycodone    Vicodin [hydrocodone-acetaminophen]         Medication List     STOP taking these medications    doxycycline 100 MG tablet Commonly known as: VIBRA-TABS   mupirocin ointment 2 % Commonly known as: BACTROBAN       TAKE these medications    acetaminophen 500 MG tablet Commonly known as: TYLENOL Take 1,000 mg by mouth at bedtime.   albuterol 108 (90 Base) MCG/ACT  inhaler Commonly known as: VENTOLIN HFA Inhale 2 puffs into the lungs every 6 (six) hours as needed for wheezing or shortness of breath.   amLODipine 10 MG tablet Commonly known as: NORVASC Take 10 mg by mouth at bedtime.   aspirin EC 81 MG tablet Take 81 mg by mouth daily. Swallow whole.   benzonatate 100 MG capsule Commonly known as: TESSALON Take 1 capsule (100 mg total) by mouth 3 (three) times daily as needed for cough.   carvedilol 12.5 MG tablet Commonly known as: COREG Take 12.5 mg by mouth 2 (two) times daily with a meal.   cetirizine 10 MG chewable tablet Commonly known as: ZYRTEC Chew 10 mg by mouth daily.   clopidogrel 75 MG tablet Commonly known as: PLAVIX Take 1 tablet (75 mg total) by mouth daily with breakfast.   ertapenem  IVPB Commonly known as: INVANZ Inject 1 g into the vein daily for 10 days. Indication:  ESBL bacteremia First Dose: Yes Last Day of Therapy:  05/09/23 Labs - Once weekly:  CBC/D and BMP, Labs - Every other week:  ESR and CRP Method of administration: Mini-Bag Plus / Gravity Method of administration may be changed at the discretion of home infusion pharmacist based upon assessment of the patient and/or caregiver's ability to self-administer the medication ordered.   guaiFENesin 600 MG 12 hr tablet Commonly known as: MUCINEX Take 2 tablets (1,200 mg total) by mouth 2 (two) times daily as needed for to loosen phlegm.   loperamide 2 MG capsule Commonly known as: IMODIUM Take 1 capsule (2 mg total) by mouth as needed for diarrhea or loose stools.   Melatonin 10 MG Tabs Take by mouth at bedtime.   mometasone-formoterol 200-5 MCG/ACT Aero Commonly known as: DULERA Inhale 2 puffs into the lungs 2 (two) times daily.   multivitamin with minerals Tabs tablet Take 1 tablet by mouth daily.   QUEtiapine 25 MG tablet Commonly known as: SEROQUEL Take 1 tablet (25 mg total) by mouth at bedtime.   saccharomyces boulardii 250 MG  capsule Commonly known as: FLORASTOR Take 1 capsule (250 mg total) by mouth 2 (two) times daily for 7 days.               Discharge Care Instructions  (From admission, onward)  Start     Ordered   05/07/23 0000  Change dressing on IV access line weekly and PRN  (Home infusion instructions - [facility] )        05/07/23 1321   05/07/23 0000  Discharge wound care:        05/07/23 1321             The results of significant diagnostics from this hospitalization (including imaging, microbiology, ancillary and laboratory) are listed below for reference.    Procedures and Diagnostic Studies:   US RENAL  Result Date: 04/25/2023 CLINICAL DATA:  99105 Renal stone 09811 EXAM: RENAL / URINARY TRACT ULTRASOUND COMPLETE COMPARISON:  None Available. FINDINGS: Right Kidney: Renal measurements: 11.0 x 5.5 x 5.6 cm = volume: 176 mL. Echogenicity within normal limits. There is an echogenic focus with posterior acoustic shadowing measuring approximately 2.2 cm within the renal pelvis most consistent with a nephrolithiasis. There is mild adjacent hydronephrosis. Left Kidney: Renal measurements: 12.8 x 6.1 x 5.3 cm = volume: Changes 15 mL. Echogenicity within normal limits. No mass or definitive hydronephrosis visualized. Bladder: Completely decompressed. Other: None. IMPRESSION: There is a 2.2 cm nephrolithiasis within the right renal pelvis with mild upstream hydronephrosis. Electronically Signed   By: Meda Klinefelter M.D.   On: 04/25/2023 16:30   CT Angio Chest PE W and/or Wo Contrast  Result Date: 04/25/2023 CLINICAL DATA:  Pulmonary embolism (PE) suspected, high prob EXAM: CT ANGIOGRAPHY CHEST WITH CONTRAST TECHNIQUE: Multidetector CT imaging of the chest was performed using the standard protocol during bolus administration of intravenous contrast. Multiplanar CT image reconstructions and MIPs were obtained to evaluate the vascular anatomy. RADIATION DOSE REDUCTION:  This exam was performed according to the departmental dose-optimization program which includes automated exposure control, adjustment of the mA and/or kV according to patient size and/or use of iterative reconstruction technique. CONTRAST:  75mL OMNIPAQUE IOHEXOL 350 MG/ML SOLN COMPARISON:  Same day chest x-ray. FINDINGS: Cardiovascular: Satisfactory opacification of the pulmonary arteries to the segmental level. No evidence of pulmonary embolism. Trace pericardial fluid, likely physiologic. Heart is mildly enlarged. Atherosclerotic calcifications of the nonaneurysmal thoracic aorta. Three-vessel coronary artery atherosclerotic calcifications. Mediastinum/Nodes: No axillary or mediastinal adenopathy. Visualized thyroid is unremarkable. Lungs/Pleura: No pneumothorax. Trace RIGHT pleural effusion. Mild centrilobular emphysema. Multifocal debris in the trachea with bibasilar endobronchial debris, RIGHT greater than LEFT. There is a small amount of downstream centrilobular and consolidative opacities in the RIGHT lower lobe at the lung base. Mildly expansile mucous plugging in the superior aspect of the LEFT lower lobe (series 7, image 153). Upper Abdomen: Hepatic steatosis.  Tiny hiatal hernia. Musculoskeletal: Status post LEFT breast and axillary surgery. No acute osseous abnormality. Review of the MIP images confirms the above findings. IMPRESSION: 1. No evidence of pulmonary embolism. 2. Multifocal debris in the trachea with bibasilar endobronchial debris, RIGHT greater than LEFT. There is a small amount of downstream centrilobular and consolidative opacities in the RIGHT lower lobe at the lung base. Findings could reflect aspiration or infection. Aortic Atherosclerosis (ICD10-I70.0) and Emphysema (ICD10-J43.9). Electronically Signed   By: Meda Klinefelter M.D.   On: 04/25/2023 14:42   CT Head Wo Contrast  Result Date: 04/25/2023 CLINICAL DATA:  Mental status change, unknown cause. EXAM: CT HEAD WITHOUT  CONTRAST TECHNIQUE: Contiguous axial images were obtained from the base of the skull through the vertex without intravenous contrast. RADIATION DOSE REDUCTION: This exam was performed according to the departmental dose-optimization program which includes automated exposure control, adjustment  of the mA and/or kV according to patient size and/or use of iterative reconstruction technique. COMPARISON:  None Available. FINDINGS: Brain: No acute intracranial hemorrhage. 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 Patchy hypoattenuation of the periventricular white matter, most consistent with a mild chronic small-vessel disease. No hydrocephalus or extra-axial collection. No mass effect or midline shift. Vascular: No hyperdense vessel or unexpected calcification. Skull: No calvarial fracture or suspicious bone lesion. Skull base is unremarkable. Sinuses/Orbits: Chronic right sphenoid sinusitis. Other: None. IMPRESSION: 1. No acute intracranial hemorrhage. 2. 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 Electronically Signed   By: Orvan Falconer M.D.   On: 04/25/2023 14:35   DG Chest Port 1 View  Result Date: 04/25/2023 CLINICAL DATA:  Cough, dyspnea. EXAM: PORTABLE CHEST 1 VIEW COMPARISON:  None Available. FINDINGS: Heart size is upper limits of normal. Surgical clips in left axilla. Mildly prominent interstitial lung markings. No overt pulmonary edema. Atherosclerotic calcifications at the aortic arch. Negative for a pneumothorax. No acute bone abnormality. Mild irregularity involving bilateral ribs probably represent old fractures. IMPRESSION: Mildly prominent interstitial lung markings. Findings are nonspecific but could be associated with chronic changes. Difficult to exclude mild interstitial edema. Electronically Signed   By: Richarda Overlie M.D.   On: 04/25/2023 10:21     Labs:   Basic Metabolic Panel: Recent Labs  Lab  05/01/23 0324 05/03/23 1050 05/05/23 0949  NA 136 134* 137  K 3.6 4.2 4.0  CL 102 101 104  CO2 24 24 24   GLUCOSE 113* 114* 177*  BUN 14 11 11   CREATININE 0.88 0.86 1.02*  CALCIUM 9.0 8.8* 9.1  MG  --   --  2.2   GFR Estimated Creatinine Clearance: 47.1 mL/min (A) (by C-G formula based on SCr of 1.02 mg/dL (H)). Liver Function Tests: Recent Labs  Lab 05/03/23 1050 05/05/23 0949  AST 17 18  ALT 15 16  ALKPHOS 60 70  BILITOT 0.5 0.3  PROT 6.6 6.6  ALBUMIN 2.6* 2.8*   No results for input(s): "LIPASE", "AMYLASE" in the last 168 hours. No results for input(s): "AMMONIA" in the last 168 hours. Coagulation profile No results for input(s): "INR", "PROTIME" in the last 168 hours.  CBC: Recent Labs  Lab 05/01/23 0324 05/03/23 1050 05/05/23 0949  WBC 8.6 6.9 4.8  HGB 12.3 11.4* 11.9*  HCT 37.0 36.1 38.7  MCV 80.8 82.4 82.3  PLT 353 322 354   Cardiac Enzymes: No results for input(s): "CKTOTAL", "CKMB", "CKMBINDEX", "TROPONINI" in the last 168 hours. BNP: Invalid input(s): "POCBNP" CBG: No results for input(s): "GLUCAP" in the last 168 hours. D-Dimer No results for input(s): "DDIMER" in the last 72 hours. Hgb A1c No results for input(s): "HGBA1C" in the last 72 hours. Lipid Profile No results for input(s): "CHOL", "HDL", "LDLCALC", "TRIG", "CHOLHDL", "LDLDIRECT" in the last 72 hours. Thyroid function studies No results for input(s): "TSH", "T4TOTAL", "T3FREE", "THYROIDAB" in the last 72 hours.  Invalid input(s): "FREET3" Anemia work up No results for input(s): "VITAMINB12", "FOLATE", "FERRITIN", "TIBC", "IRON", "RETICCTPCT" in the last 72 hours. Microbiology Recent Results (from the past 240 hour(s))  Urine Culture     Status: None   Collection Time: 05/05/23  4:03 PM   Specimen: Urine, Random  Result Value Ref Range Status   Specimen Description URINE, RANDOM  Final   Special Requests NONE Reflexed from [id number]  Final   Culture   Final    NO  GROWTH Performed  at [facility], [address] [place], [place] [address]    Report Status 05/07/2023 FINAL  Final    Time coordinating discharge: 45 minutes  Signed: Anasia Agro  [facility] 05/07/2023, 1:21 PM

## 2023-05-07 NOTE — Progress Notes (Signed)
Daughter [name] called and made aware that she needed to come and collect her moms wheel chair. She stated that she will come now. Expected to arrive at 1700

## 2023-05-12 ENCOUNTER — Encounter: Payer: Medicare Other | Attending: Physician Assistant | Admitting: Physician Assistant

## 2023-05-12 DIAGNOSIS — Z7901 Long term (current) use of anticoagulants: Secondary | ICD-10-CM | POA: Insufficient documentation

## 2023-05-12 DIAGNOSIS — L97512 Non-pressure chronic ulcer of other part of right foot with fat layer exposed: Secondary | ICD-10-CM | POA: Insufficient documentation

## 2023-05-12 DIAGNOSIS — J449 Chronic obstructive pulmonary disease, unspecified: Secondary | ICD-10-CM | POA: Diagnosis not present

## 2023-05-12 DIAGNOSIS — G603 Idiopathic progressive neuropathy: Secondary | ICD-10-CM | POA: Insufficient documentation

## 2023-05-12 DIAGNOSIS — I2510 Atherosclerotic heart disease of native coronary artery without angina pectoris: Secondary | ICD-10-CM | POA: Insufficient documentation

## 2023-05-22 ENCOUNTER — Ambulatory Visit (INDEPENDENT_AMBULATORY_CARE_PROVIDER_SITE_OTHER): Payer: Medicare Other | Admitting: Urology

## 2023-05-22 ENCOUNTER — Other Ambulatory Visit
Admission: RE | Admit: 2023-05-22 | Discharge: 2023-05-22 | Disposition: A | Discharge status: Home / Self Care | Payer: Medicare Other | Attending: Urology | Admitting: Urology

## 2023-05-22 ENCOUNTER — Other Ambulatory Visit: Payer: Self-pay

## 2023-05-22 ENCOUNTER — Encounter: Payer: Medicare Other | Admitting: Physician Assistant

## 2023-05-22 VITALS — BP 125/77 | Ht 63.0 in | Wt 150.8 lb

## 2023-05-22 DIAGNOSIS — N200 Calculus of kidney: Secondary | ICD-10-CM | POA: Insufficient documentation

## 2023-05-22 DIAGNOSIS — G603 Idiopathic progressive neuropathy: Secondary | ICD-10-CM | POA: Diagnosis not present

## 2023-05-22 DIAGNOSIS — Z01818 Encounter for other preprocedural examination: Secondary | ICD-10-CM

## 2023-05-22 DIAGNOSIS — N390 Urinary tract infection, site not specified: Secondary | ICD-10-CM

## 2023-05-22 DIAGNOSIS — R319 Hematuria, unspecified: Secondary | ICD-10-CM

## 2023-05-22 LAB — URINALYSIS, COMPLETE (UACMP) WITH MICROSCOPIC
Bilirubin Urine: NEGATIVE
Glucose, UA: NEGATIVE mg/dL
Ketones, ur: NEGATIVE mg/dL
Nitrite: POSITIVE — AB
Protein, ur: 100 mg/dL — AB
RBC / HPF: >50 RBC/hpf (ref 0–5)
Specific Gravity, Urine: 1.025 (ref 1.005–1.030)
WBC, UA: >50 WBC/hpf (ref 0–5)
pH: 5.5 (ref 5.0–8.0)

## 2023-05-22 LAB — BLADDER SCAN AMB NON-IMAGING: Scan Result: 0

## 2023-05-22 NOTE — Patient Instructions (Signed)
Ureteroscopy  Ureteroscopy is a procedure to check for and treat problems inside part of the urinary tract. In this procedure, a long rigid or flexible tube with a lens and light at the end (ureteroscope) is used to look at the inside of the kidneys and the ureters. The ureters are the tubes that carry urine from the kidneys to the bladder. The ureteroscope is inserted into one or both of the ureters. You may need this procedure if you have frequent urinary tract infections (UTIs), blood in your urine, or a stone in one or both of your ureters. A ureteroscopy can be done: To find the cause of urine blockage in a ureter and to evaluate other abnormalities inside the ureters or kidneys. To remove stones. To remove or treat growths of tissue (polyps), abnormal tissue, and some types of tumors. To remove a tissue sample and check it for disease under a microscope (biopsy). Tell a health care provider about: Any allergies you have. All medicines you are taking, including vitamins, herbs, eye drops, creams, and over-the-counter medicines. Any problems you or family members have had with anesthetic medicines. Any bleeding problems you have. Any surgeries you have had. Any medical conditions you have. Whether you are pregnant or may be pregnant. What are the risks? Your health care provider will talk with you about risks. These may include: Abdominal pain or a burning feeling or pain while urinating. Abnormal bleeding. A UTI. Allergic reactions to medicines. Scarring that narrows the ureter (stricture) or swelling. Creating a hole (perforation) in the ureter. Damage to other structures or organs, such as the part of your body that drains urine from your bladder (urethra), your bladder, or your uterus. What happens before the procedure? When to stop eating and drinking  8 hours before your procedure Stop eating most foods. Do not eat meat, fried foods, or fatty foods. Eat only light foods, such  as toast or crackers. All liquids are okay except energy drinks and alcohol. 6 hours before your procedure Stop eating. Drink only clear liquids, such as water, clear fruit juice, black coffee, plain tea, and sports drinks. Do not drink energy drinks or alcohol. 2 hours before your procedure Stop drinking all liquids. You may be allowed to take medicines with small sips of water. Medicines Ask your health care provider about: Changing or stopping your regular medicines. These include any diabetes medicines or blood thinners you take. Taking medicines such as aspirin and ibuprofen. These medicines can thin your blood. Do not take these medicines unless your health care provider tells you to. Taking over-the-counter medicines, vitamins, herbs, and supplements. General instructions Do not use any products that contain nicotine or tobacco for at least 4 weeks before the procedure. These products include cigarettes, chewing tobacco, and vaping devices, such as e-cigarettes. If you need help quitting, ask your health care provider. If you will be going home right after the procedure, plan to have a responsible adult: Take you home from the hospital or clinic. You will not be allowed to drive. Care for you for the time you are told. Ask your health care provider what steps will be taken to help prevent infection. These may include: Washing skin with a soap that kills germs. Receiving antibiotic medicine. Tests You may have an exam or testing. You may have a urine sample taken to check for infection. What happens during the procedure? An IV will be inserted into one of your veins. You may be given: A sedative. This helps   you relax. Anesthesia. This will: Numb certain areas of your body. Make you fall asleep for surgery. Your urethra will be cleaned with a germ-killing solution. The ureteroscope will be passed through your urethra into your bladder. A salt-water solution will be sent through  the ureteroscope to fill your bladder. This will help the health care provider see the openings of your ureters more clearly. The ureteroscope will be passed into your ureter. If a growth is found, a biopsy may be done. If a stone is found, it may be removed through the ureteroscope, or the stone may be broken up using a laser, shock waves, or electrical energy. In some cases, if the ureter is too small, a tube may be inserted that keeps the ureter open (ureteral stent). The stent may be left in place for 1 or 2 weeks, and then the ureteroscopy procedure will be done again. The scope will be removed, and your bladder will be emptied. The procedure may vary among health care providers and hospitals. What happens after the procedure? Your blood pressure, heart rate, breathing rate, and blood oxygen level will be monitored until you leave the hospital or clinic. It is up to you to get the results of your procedure. Ask your health care provider, or the department that is doing the procedure, when your results will be ready. Summary Ureteroscopy is a procedure used to look at the inside of the kidneys and the ureters. You may need this procedure if you have frequent urinary tract infections (UTIs), blood in your urine, or a stone in one or both of your ureters. Follow instructions from your health care provider about eating and drinking. In some cases, if the ureter is too small, a tube may be inserted that keeps the ureter open (ureteral stent). The stent may be left in place for 1 or 2 weeks to keep the ureter open, and then the ureteroscopy procedure will be done again. This information is not intended to replace advice given to you by your health care provider. Make sure you discuss any questions you have with your health care provider. Document Revised: 11/10/2022 Document Reviewed: 11/10/2022 Elsevier Patient Education  2024 Elsevier Inc.  

## 2023-05-22 NOTE — Progress Notes (Signed)
[name],acting as a scribe for [name], MD.,have documented all relevant documentation on the behalf of [name], MD,as directed by  [name], MD while in the presence of [name], MD.  05/22/2023 3:45 PM   [name] March 13, 1951 [id number]  Referring provider: No referring provider defined for this encounter.  Chief Complaint  Patient presents with   New Patient (Initial Visit)    HPI: 72 year-old female who presents today with her daughter and granddaughter for further evaluation of recurrent UTI's and a kidney stone. She was just admitted twice, both times with a urinary tract infection and possible pyelonephritis. Under her most recent admission, she had a CT scan on 01/03/2023 that showed a 12x6x11 mm right renal pelvic stone along with mild right hydronephrosis and inflammation while enhancement of the proximal ureter and renal pelvis with surrounding inflammation. There is also scarring throughout the right renal cortex. There is also evidence of possible left sided pyelonephritis. She has also had other imaging modality including a renal ultrasound. She was seen and evaluated during her first admission for complicated ESBL bacteremia and UTI at which time she was not interested in treatment. At the time, she reported that she had his known stone and preferred to manage it conservatively. The stone was first identified in December 2019 at [facility], and she has been living with her daughter since then.  Due to her high-risk status, including Smoker's Heart Disease and COPD, both [facility] and [facility] have been hesitant to remove the stone surgically. She has a history of poor healing and major blockages in her heart, which are being managed medically. She is currently on aspirin and Plavix for circulation issues in her legs.    Results for orders placed or performed in visit on 05/22/23  BLADDER SCAN AMB NON-IMAGING  Result Value Ref Range   Scan Result 0  ml   Results for orders placed or performed during the hospital encounter of 05/22/23  Urinalysis, Complete w Microscopic -  Result Value Ref Range   Color, Urine YELLOW YELLOW   APPearance TURBID (A) CLEAR   Specific Gravity, Urine 1.025 1.005 - 1.030   pH 5.5 5.0 - 8.0   Glucose, UA NEGATIVE NEGATIVE mg/dL   Hgb urine dipstick MODERATE (A) NEGATIVE   Bilirubin Urine NEGATIVE NEGATIVE   Ketones, ur NEGATIVE NEGATIVE mg/dL   Protein, ur 409 (A) NEGATIVE mg/dL   Nitrite POSITIVE (A) NEGATIVE   Leukocytes,Ua SMALL (A) NEGATIVE   Squamous Epithelial / HPF 0-5 0 - 5 /HPF   WBC, UA >50 0 - 5 WBC/hpf   RBC / HPF >50 0 - 5 RBC/hpf   Bacteria, UA MANY (A) NONE SEEN       PMH: 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   CAD (coronary artery disease)    COPD (chronic obstructive pulmonary disease) (HCC)    Frequent UTI    Kidney stones     Surgical History: Past Surgical History:  Procedure Laterality Date   ANKLE FUSION Right 2017   fibular graft  Bilateral    bilater fibular missing and replaced in hips   HIP FRACTURE SURGERY Bilateral    LOWER EXTREMITY ANGIOGRAPHY Right 01/09/2023   Procedure: Lower Extremity Angiography;  Surgeon: Renford Dills, MD;  Location: [facility] INVASIVE CV LAB;  Service: Cardiovascular;  Laterality: Right;   MASTECTOMY, PARTIAL Left 2003  ROTATOR CUFF REPAIR Left    X 2   TONSILLECTOMY      Home Medications:  Allergies as of 05/22/2023       Reactions   Penicillins Rash   Oxycodone    Vicodin [hydrocodone-acetaminophen]         Medication List        Accurate as of May 22, 2023  3:45 PM. If you have any questions, ask your nurse or doctor.          STOP taking these medications    benzonatate 100 MG capsule Commonly known as: TESSALON Stopped by: [name], MD   loperamide 2 MG capsule Commonly known as: IMODIUM Stopped by: [name], MD    QUEtiapine 25 MG tablet Commonly known as: SEROQUEL Stopped by: [name], MD       TAKE these medications    acetaminophen 500 MG tablet Commonly known as: TYLENOL Take 1,000 mg by mouth at bedtime.   albuterol 108 (90 Base) MCG/ACT inhaler Commonly known as: VENTOLIN HFA Inhale 2 puffs into the lungs every 6 (six) hours as needed for wheezing or shortness of breath.   amLODipine 10 MG tablet Commonly known as: NORVASC Take 10 mg by mouth at bedtime.   aspirin EC 81 MG tablet Take 81 mg by mouth daily. Swallow whole.   carvedilol 12.5 MG tablet Commonly known as: COREG Take 12.5 mg by mouth 2 (two) times daily with a meal.   cetirizine 10 MG chewable tablet Commonly known as: ZYRTEC Chew 10 mg by mouth daily.   clopidogrel 75 MG tablet Commonly known as: PLAVIX Take 1 tablet (75 mg total) by mouth daily with breakfast.   guaiFENesin 600 MG 12 hr tablet Commonly known as: MUCINEX Take 2 tablets (1,200 mg total) by mouth 2 (two) times daily as needed for to loosen phlegm.   Melatonin 10 MG Tabs Take by mouth at bedtime.   mometasone-formoterol 200-5 MCG/ACT Aero Commonly known as: DULERA Inhale 2 puffs into the lungs 2 (two) times daily.   multivitamin with minerals Tabs tablet Take 1 tablet by mouth daily.        Allergies:  Allergies  Allergen Reactions   Penicillins Rash   Oxycodone    Vicodin [Hydrocodone-Acetaminophen]     Family History: Family History  Adopted: Yes    Social History:  reports that she has quit smoking. Her smoking use included cigarettes. She smoked an average of 1 pack per day. She has never used smokeless tobacco. She reports that she does not drink alcohol and does not use drugs.   Physical Exam: BP 125/77   Ht 5\' 3"  (1.6 m)   Wt 150 lb 12.8 oz (68.4 kg)   BMI 26.71 kg/m   Constitutional:  Alert and oriented, No acute distress. HEENT: [place] AT, moist mucus membranes.  Trachea midline, no masses. Neurologic:  Grossly intact, no focal deficits, moving all 4 extremities. Psychiatric: Normal mood and affect.  Pertinent Imaging:  US RENAL  Narrative CLINICAL DATA:  99105 Renal stone [address]  EXAM: RENAL / URINARY TRACT ULTRASOUND COMPLETE  COMPARISON:  None Available.  FINDINGS: Right Kidney:  Renal measurements: 11.0 x 5.5 x 5.6 cm = volume: 176 mL. Echogenicity within normal limits. There is an echogenic focus with posterior acoustic shadowing measuring approximately 2.2 cm within the renal pelvis most consistent with a nephrolithiasis. There is mild adjacent hydronephrosis.  Left Kidney:  Renal measurements: 12.8 x 6.1 x 5.3 cm = volume: Changes 15  mL. Echogenicity within normal limits. No mass or definitive hydronephrosis visualized.  Bladder:  Completely decompressed.  Other:  None.  IMPRESSION: There is a 2.2 cm nephrolithiasis within the right renal pelvis with mild upstream hydronephrosis.   Electronically Signed By: Meda Klinefelter M.D. On: 04/25/2023 16:30  This has been personally reviewed and I agree with the radiologic interpretation.  EXAM: CT ABDOMEN AND PELVIS WITH CONTRAST   TECHNIQUE: Multidetector CT imaging of the abdomen and pelvis was performed using the standard protocol following bolus administration of intravenous contrast.   RADIATION DOSE REDUCTION: This exam was performed according to the departmental dose-optimization program which includes automated exposure control, adjustment of the mA and/or kV according to patient size and/or use of iterative reconstruction technique.   CONTRAST:  75mL OMNIPAQUE IOHEXOL 350 MG/ML SOLN   COMPARISON:  Renal ultrasound 04/25/2023.  CT stone 12/18/2018.   FINDINGS: Lower chest: There is a trace right pleural effusion with right basilar atelectasis.   Hepatobiliary: No focal liver abnormality is seen. No gallstones, gallbladder wall thickening, or biliary dilatation.   Pancreas:  Unremarkable. No pancreatic ductal dilatation or surrounding inflammatory changes.   Spleen: Normal in size without focal abnormality.   Adrenals/Urinary Tract: There are patchy ill-defined areas of hypodensity within the left kidney suspicious for pyelonephritis. There is no hydronephrosis. There are renal cortical cysts in the left kidney measuring up 2 10 mm.   There is a 12 by 6 by 11 mm calculus in the right renal pelvis. There is mild right-sided hydronephrosis. There is some wall enhancement of the right renal pelvis, collecting system and proximal ureter with mild surrounding inflammation. There is scarring throughout the right renal cortex. There are additional nonobstructing calculi in the right kidney. The right kidney enhances normally.   The adrenal glands and bladder are within normal limits.   Stomach/Bowel: Stomach is within normal limits. Appendix appears normal. No evidence of bowel wall thickening, distention, or inflammatory changes. There is sigmoid colon diverticulosis.   Vascular/Lymphatic: Aortic atherosclerosis. No enlarged abdominal or pelvic lymph nodes.   Reproductive: Uterus and bilateral adnexa are unremarkable.   Other: There is a small fat containing umbilical hernia. No abdominopelvic ascites.   Musculoskeletal: Degenerative changes affect the spine. There are tracts from old bilateral hip screws present.   IMPRESSION: 1. Findings compatible with left-sided pyelonephritis. 2. There is a 12 x 6 x 11 mm calculus in the right renal pelvis with mild right-sided hydronephrosis. There is wall enhancement of the right renal pelvis, collecting system and proximal ureter with mild surrounding inflammation concerning for pyelitis. 3. Additional nonobstructing right renal calculi. 4. Trace right pleural effusion with right basilar atelectasis.   Aortic Atherosclerosis (ICD10-I70.0).     Electronically Signed   By: Darliss Cheney M.D.   On:  05/04/2023 20:50  This has been personally reviewed and I agree with the radiologic interpretation.  Assessment & Plan:    1. Recurrent UTI's and Kidney stone - Consider ureteroscopy with laser lithotripsy for stone removal. - Obtain cardiac and vascular clearance for stopping aspirin and Plavix if shockwave lithotripsy is considered. - Pre-op urine culture to ensure no active infection - If infection present, treat with appropriate antibiotics before the procedure.  We discussed various treatment options for urolithiasis including observation with or without medical expulsive therapy, shockwave lithotripsy (SWL), ureteroscopy and laser lithotripsy with stent placement, and percutaneous nephrolithotomy.   We discussed that management is based on stone size, location, density, patient co-morbidities, and patient preference.  Stones <18mm in size have a >80% spontaneous passage rate. Data surrounding the use of tamsulosin for medical expulsive therapy is controversial, but meta analyses suggests it is most efficacious for distal stones between 5-35mm in size. Possible side effects include dizziness/lightheadedness, and retrograde ejaculation.   SWL has a lower stone free rate in a single procedure, but also a lower complication rate compared to ureteroscopy and avoids a stent and associated stent related symptoms. Possible complications include renal hematoma, steinstrasse, and need for additional treatment. We discussed the role of his increased skin to stone distance can lead to decreased efficacy with shockwave lithotripsy.   Ureteroscopy with laser lithotripsy and stent placement has a higher stone free rate than SWL in a single procedure, however increased complication rate including possible infection, ureteral injury, bleeding, and stent related morbidity. Common stent related symptoms include dysuria, urgency/frequency, and flank pain.   After an extensive discussion of the risks and  benefits of the above treatment options, the patient would like to proceed with ureteroscopy with laser lithotripsy.   Return for ureteroscopy with laser lithotripsy once cardiac and vascular clearance is obtained.   [facility] [address] [place], [place] [address] [phone]

## 2023-05-24 LAB — URINE CULTURE: Culture: >=100000 — AB

## 2023-05-25 LAB — URINE CULTURE

## 2023-05-26 ENCOUNTER — Other Ambulatory Visit: Payer: Self-pay | Admitting: Urology

## 2023-05-26 DIAGNOSIS — Z1624 Resistance to multiple antibiotics: Secondary | ICD-10-CM

## 2023-05-26 DIAGNOSIS — N200 Calculus of kidney: Secondary | ICD-10-CM

## 2023-05-26 NOTE — Progress Notes (Signed)
Surgical Physician Order Form [facility]  * Scheduling expectation :  Will need to coordinate with infectious disease, she will need at least 7 days of IV antibiotics prior to the procedure.   Grew ESBL E. coli only sensitive to imipenem and pip-tazo.  Infectious disease referral placed, please let the patient know.  *Length of Case:   *Clearance needed: yes  *Anticoagulation Instructions:  hold plavix, need cardiac and vascular clearance  *Aspirin Instructions: Ok to continue Aspirin  *Post-op visit Date/Instructions:   TBD  *Diagnosis: Right Nephrolithiasis  *Procedure: right Ureteroscopy w/laser lithotripsy & stent placement (11914)   Additional orders: N/A  -Admit type: OUTpatient  -Anesthesia: General  -VTE Prophylaxis Standing Order SCD's       Other:   -Standing Lab Orders Per Anesthesia    Lab other: None  -Standing Test orders EKG/Chest x-ray per Anesthesia       Test other:   - Medications:   TBD  -Other orders:

## 2023-05-28 ENCOUNTER — Telehealth: Payer: Self-pay

## 2023-05-28 NOTE — Telephone Encounter (Signed)
I have called on 05/26/2023 and 05/28/2023 without answer to set up surgery. I have left detailed messages both days to return my call. Will try again.

## 2023-06-01 ENCOUNTER — Ambulatory Visit: Payer: Medicare Other | Admitting: Physician Assistant

## 2023-06-01 NOTE — Progress Notes (Signed)
   [facility] Surgical Posting Form  Surgery Date: Date: 07/13/2023  Surgeon: Dr. [name], MD  Inpt ( No  )   Outpt (Yes)   Obs ( No  )   Diagnosis: N20.0 Right Nephrolithiasis  -CPT: [phone]  Surgery: Right Ureteroscopy with Laser Lithotripsy and Stent Placement  Stop Anticoagulations: Yes will need to hold Plavix, may continue ASA  Cardiac/Medical/Pulmonary Clearance needed: yes  Clearance needed from Dr: Sheppard Plumber, NP and Dr. Arrie Senate- [facility]  Clearance request sent on: Date: 06/01/23  *Orders entered into EPIC  Date: 05/27/2023    *Case booked in EPIC  Date: 05/27/2023  *Notified pt of Surgery: Date: 05/27/2023  PRE-OP UA & CX: no  *Placed into Prior Authorization Work Angela Nevin Date: 06/01/23  Assistant/laser/rep:No  Patient will need at least 7 days of IV abxs prior to surgery. Will coordinate with ID. Seeing ID in clinic on 06/02/2023.

## 2023-06-01 NOTE — Progress Notes (Signed)
  Phone Number: [phone] for Surgical Coordinator Fax Number: [phone]  REQUEST FOR SURGICAL CLEARANCE       Date: Date: 06/01/23  Faxed to: Dr. Arrie Senate, MD  Surgeon: Dr. [name], MD     Date of Surgery: 07/13/2023  Operation:  Right Ureteroscopy with Laser Lithotripsy and Stent Placement   Anesthesia Type: General   Diagnosis: Right Nephrolithiasis  Patient Requires:   Cardiac / Vascular Clearance : Yes  Risk Assessment:    Low   []       Moderate   []     High   []           This patient is optimized for surgery  YES []       NO   []    I recommend further assessment/workup prior to surgery. YES []      NO  []   Appointment scheduled for: _______________________   Further recommendations: ____________________________________     Physician Signature:__________________________________   Printed Name: ________________________________________   Date: _________________

## 2023-06-01 NOTE — Progress Notes (Signed)
  Phone Number: [phone] for Surgical Coordinator Fax Number: [phone]  REQUEST FOR SURGICAL CLEARANCE       Date: Date: 06/01/23  Faxed to: Sheppard Plumber, NP  Surgeon: Dr. [name], MD     Date of Surgery: 07/13/2023  Operation:  Right Ureteroscopy with Laser Lithotripsy and Stent Placement   Anesthesia Type: General   Diagnosis: Right Nephrolithiasis   Patient Requires:   Cardiac / Vascular Clearance : Yes  Reason: Patient will need to hold Plavix for procedure, may continue ASA.    Risk Assessment:    Low   []       Moderate   []     High   []           This patient is optimized for surgery  YES []       NO   []    I recommend further assessment/workup prior to surgery. YES []      NO  []   Appointment scheduled for: _______________________   Further recommendations: ____________________________________     Physician Signature:__________________________________   Printed Name: ________________________________________   Date: _________________

## 2023-06-01 NOTE — Telephone Encounter (Signed)
I spoke with [name] and her daughter. We have discussed possible surgery dates and Monday July 22nd, 2024 was agreed upon by all parties. Patient given information about surgery date, what to expect pre-operatively and post operatively.  We discussed that a Pre-Admission Testing office will be calling to set up the pre-op visit that will take place prior to surgery, and that these appointments are typically done over the phone with a Pre-Admissions RN. Informed patient that our office will communicate any additional care to be provided after surgery. Patients questions or concerns were discussed during our call. Advised to call our office should there be any additional information, questions or concerns that arise. Patient verbalized understanding.

## 2023-06-02 ENCOUNTER — Encounter: Payer: Self-pay | Admitting: Infectious Diseases

## 2023-06-02 ENCOUNTER — Ambulatory Visit: Payer: Medicare Other | Attending: Infectious Diseases | Admitting: Infectious Diseases

## 2023-06-02 VITALS — BP 119/77 | HR 64 | Temp 97.0°F

## 2023-06-02 DIAGNOSIS — Z87440 Personal history of urinary (tract) infections: Secondary | ICD-10-CM | POA: Diagnosis not present

## 2023-06-02 DIAGNOSIS — N390 Urinary tract infection, site not specified: Secondary | ICD-10-CM

## 2023-06-02 DIAGNOSIS — N200 Calculus of kidney: Secondary | ICD-10-CM | POA: Diagnosis not present

## 2023-06-02 DIAGNOSIS — Z8619 Personal history of other infectious and parasitic diseases: Secondary | ICD-10-CM

## 2023-06-02 DIAGNOSIS — Z22358 Carrier of other enterobacterales: Secondary | ICD-10-CM | POA: Diagnosis not present

## 2023-06-02 DIAGNOSIS — Z1624 Resistance to multiple antibiotics: Secondary | ICD-10-CM | POA: Diagnosis present

## 2023-06-02 NOTE — Progress Notes (Unsigned)
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   CAD (coronary artery disease)    COPD (chronic obstructive pulmonary disease) (HCC)    Frequent UTI    Kidney stones     Past Surgical History:  Procedure Laterality Date   ANKLE FUSION Right 2017   fibular graft  Bilateral    bilater fibular missing and replaced in hips   HIP FRACTURE SURGERY Bilateral    LOWER EXTREMITY ANGIOGRAPHY Right 01/09/2023   Procedure: Lower Extremity Angiography;  Surgeon: Renford Dills, MD;  Location: [facility];  Service: Cardiovascular;  Laterality: Right;   MASTECTOMY, PARTIAL Left 2003   ROTATOR CUFF REPAIR Left    X 2   TONSILLECTOMY      Social History   Socioeconomic History   Marital status: Single    Spouse name: Not on file   Number of children: Not on file   Years of education: Not on file   Highest education level: Not on file  Occupational History   Not on file  Tobacco Use   Smoking status: Former    Packs/day: 1    Types: Cigarettes   Smokeless tobacco: Never  Vaping Use   Vaping Use: Never used  Substance and Sexual Activity   Alcohol use: No   Drug use: No   Sexual activity: Not on file  Other Topics Concern   Not on file  Social History Narrative   Not on file   Social Determinants of Health   Financial Resource Strain: Not on file  Food  Insecurity: No Food Insecurity (04/25/2023)   Hunger Vital Sign    Worried About Running Out of Food in the Last Year: Never true    Ran Out of Food in the Last Year: Never true  Transportation Needs: No Transportation Needs (04/25/2023)   PRAPARE - Administrator, Civil Service (Medical): No    Lack of Transportation (Non-Medical): No  Physical Activity: Not on file  Stress: Not on file  Social Connections: Not on file  Intimate Partner Violence: Not At Risk (04/25/2023)   Humiliation, Afraid, Rape, and Kick questionnaire    Fear of Current or Ex-Partner: No    Emotionally Abused: No    Physically Abused: No    Sexually Abused: No    Family History  Adopted: Yes   Allergies  Allergen Reactions   Penicillins Rash   Oxycodone    Vicodin [Hydrocodone-Acetaminophen]    I? Current Outpatient Medications  Medication Sig Dispense Refill   acetaminophen (TYLENOL) 500 MG tablet Take 1,000 mg by mouth at bedtime.     albuterol (PROVENTIL HFA;VENTOLIN HFA) 108 (90 Base) MCG/ACT inhaler Inhale 2 puffs into the lungs every 6 (six) hours as needed for wheezing or shortness of breath.     amLODipine (NORVASC) 10 MG tablet Take 10 mg by mouth at bedtime.     aspirin EC  81 MG tablet Take 81 mg by mouth daily. Swallow whole.     carvedilol (COREG) 12.5 MG tablet Take 12.5 mg by mouth 2 (two) times daily with a meal.     cetirizine (ZYRTEC) 10 MG chewable tablet Chew 10 mg by mouth daily.     clopidogrel (PLAVIX) 75 MG tablet Take 1 tablet (75 mg total) by mouth daily with breakfast. 30 tablet 11   Melatonin 10 MG TABS Take by mouth at bedtime.     mometasone-formoterol (DULERA) 200-5 MCG/ACT AERO Inhale 2 puffs into the lungs 2 (two) times daily. 1 each    Multiple Vitamin (MULTIVITAMIN WITH MINERALS) TABS tablet Take 1 tablet by mouth daily.     No current facility-administered medications for this visit.     Abtx:  Anti-infectives (From admission, onward)    None        REVIEW OF SYSTEMS:  Const: negative fever, negative chills, negative weight loss Eyes: negative diplopia or visual changes, negative eye pain ENT: negative coryza, negative sore throat Resp: negative cough, hemoptysis, dyspnea Cards: negative for chest pain, palpitations, lower extremity edema GU: negative for frequency, dysuria and hematuria GI: Negative for abdominal pain, diarrhea, bleeding, constipation Skin: negative for rash and pruritus Heme: negative for easy bruising and gum/nose bleeding MS: negative for myalgias, arthralgias, back pain and muscle weakness Neurolo:negative for headaches, dizziness, vertigo, memory problems  Psych: negative for feelings of anxiety, depression  Endocrine: negative for thyroid, diabetes Allergy/Immunology- negative for any medication or food allergies ? Pertinent Positives include : Objective:  VITALS:  BP 119/77   Pulse 64   Temp (!) 97 F (36.1 C) (Temporal)   SpO2 95%  LDA Foley Central line Other drainage tubes PHYSICAL EXAM:  General: Alert, cooperative, no distress, appears stated age.  Head: Normocephalic, without obvious abnormality, atraumatic. Eyes: Conjunctivae clear, anicteric sclerae. Pupils are equal ENT Nares normal. No drainage or sinus tenderness. Lips, mucosa, and tongue normal. No Thrush Neck: Supple, symmetrical, no adenopathy, thyroid: non tender no carotid bruit and no JVD. Back: No CVA tenderness. Lungs: Clear to auscultation bilaterally. No Wheezing or Rhonchi. No rales. Heart: Regular rate and rhythm, no murmur, rub or gallop. Abdomen: Soft, non-tender,not distended. Bowel sounds normal. No masses Extremities: atraumatic, no cyanosis. No edema. No clubbing Skin: No rashes or lesions. Or bruising Lymph: Cervical, supraclavicular normal. Neurologic: Grossly non-focal Pertinent Labs Lab Results CBC    Component Value Date/Time   WBC 4.8 05/05/2023 0949   RBC 4.70 05/05/2023 0949   HGB 11.9 (L)  05/05/2023 0949   HCT 38.7 05/05/2023 0949   PLT 354 05/05/2023 0949   MCV 82.3 05/05/2023 0949   MCH 25.3 (L) 05/05/2023 0949   MCHC 30.7 05/05/2023 0949   RDW 13.9 05/05/2023 0949   LYMPHSABS 1.8 04/29/2023 0226   MONOABS 0.7 04/29/2023 0226   EOSABS 0.2 04/29/2023 0226   BASOSABS 0.1 04/29/2023 0226       Latest Ref Rng & Units 05/05/2023    9:49 AM 05/03/2023   10:50 AM 05/01/2023    3:24 AM  CMP  Glucose 70 - 99 mg/dL 161  096  045   BUN 8 - 23 mg/dL 11  11  14    Creatinine 0.44 - 1.00 mg/dL 4.09  8.11  9.14   Sodium 135 - 145 mmol/L 137  134  136   Potassium 3.5 - 5.1 mmol/L 4.0  4.2  3.6   Chloride 98 - 111 mmol/L 104  101  102  CO2 22 - 32 mmol/L 24  24  24    Calcium 8.9 - 10.3 mg/dL 9.1  8.8  9.0   Total Protein 6.5 - 8.1 g/dL 6.6  6.6    Total Bilirubin 0.3 - 1.2 mg/dL 0.3  0.5    Alkaline Phos 38 - 126 U/L 70  60    AST 15 - 41 U/L 18  17    ALT 0 - 44 U/L 16  15        Microbiology: No results found for this or any previous visit (from the past 240 hour(s)).  IMAGING RESULTS: I have personally reviewed the films ? Impression/Recommendation ? ? ? ___________________________________________________ Discussed with patient, requesting provider Note:  This document was prepared using Dragon voice recognition software and may include unintentional dictation errors.

## 2023-06-02 NOTE — Patient Instructions (Signed)
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

## 2023-06-04 ENCOUNTER — Telehealth: Payer: Self-pay

## 2023-06-04 NOTE — Telephone Encounter (Signed)
-----   Message from Georgiana Spinner, NP sent at 06/04/2023  1:56 PM EDT ----- Regarding: Surgical Clearance from [facility] The patient can hold plavix for 5 days prior to intervention and continue with ASA. ----- Message ----- From: Letta Kocher, CMA Sent: 06/01/2023   4:59 PM EDT To: Georgiana Spinner, NP  Surgical Clearance

## 2023-06-08 ENCOUNTER — Encounter: Payer: Medicare Other | Attending: Physician Assistant | Admitting: Physician Assistant

## 2023-06-08 DIAGNOSIS — Z7901 Long term (current) use of anticoagulants: Secondary | ICD-10-CM | POA: Diagnosis not present

## 2023-06-08 DIAGNOSIS — Z7902 Long term (current) use of antithrombotics/antiplatelets: Secondary | ICD-10-CM | POA: Insufficient documentation

## 2023-06-08 DIAGNOSIS — J449 Chronic obstructive pulmonary disease, unspecified: Secondary | ICD-10-CM | POA: Insufficient documentation

## 2023-06-08 DIAGNOSIS — I2510 Atherosclerotic heart disease of native coronary artery without angina pectoris: Secondary | ICD-10-CM | POA: Insufficient documentation

## 2023-06-08 DIAGNOSIS — L97512 Non-pressure chronic ulcer of other part of right foot with fat layer exposed: Secondary | ICD-10-CM | POA: Diagnosis present

## 2023-06-08 DIAGNOSIS — G603 Idiopathic progressive neuropathy: Secondary | ICD-10-CM | POA: Diagnosis not present

## 2023-06-08 NOTE — Progress Notes (Addendum)
[place], Lenise Herald ([id number]) [id number] Page 1 of 6 Visit Report for 06/08/2023 Chief Complaint Document Details Patient Name: Date of Service: [name], [name] 06/08/2023 2:00 PM Medical Record Number: [id number] Patient Account Number: [id number] Date of Birth/Sex: Treating RN: Nov 22, 1951 (72 y.o. [name] Primary Care Provider: PA Zenovia Jordan, [place] Other Clinician: Referring Provider: Treating Provider/Extender: Riccardo Dubin in Treatment: 16 Information Obtained from: Patient Chief Complaint Right foot ulcers Electronic Signature(s) Signed: 06/08/2023 2:13:20 PM By: Allen Derry PA-C Entered By: Allen Derry on 06/08/2023 14:13:20 -------------------------------------------------------------------------------- HPI Details Patient Name: Date of Service: [name], [name] 06/08/2023 2:00 PM Medical Record Number: [id number] Patient Account Number: [id number] Date of Birth/Sex: Treating RN: [birth year] (72 y.o. [name] Primary Care Provider: PA TIENT, [place] Other Clinician: Referring Provider: Treating Provider/Extender: Riccardo Dubin in Treatment: 16 History of Present Illness HPI Description: 02-16-2023 upon evaluation today patient presents for initial inspection here in our clinic concerning issues that she has been having with wounds over the right foot in the realm of several toes as well as the lateral portion of her foot fifth metatarsal location. Subsequently she unfortunately has somewhat poor arterial flow. I do have notes that I reviewed in epic and subsequently the patient did have an angiogram as well back in January. The testing following the angiogram revealed after stent placement on 01-09-2023 that she subsequently still had somewhat compromised arterial flow following with a right ABI of 0.70 with a TBI of 0.34 and on the left an ABI of 0.76 and the TBI could not be measured. 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 With that being said the follow-up as far as her arterial testing they did not do ABIs and TBI's but it showed that she was monophasic pretty much throughout and in general I think this still shows that there is a significant issue here and the vascular doctors seem to be in agreement as far as this is concerned they have recommended that she really needs to have repeat arterial angiogram with likely intervention to try to improve that monophasic flow. With that being said the patient is somewhat reluctant to do this though I think that she is leaning towards it even though she had a lot of bleeding issues in the groin area following the last time she had this done. In regard to her wound she is actually having quite a bit of an issue here with these wounds specifically in the realm of getting them to turn around and heal appropriately. She tells me that the areas are looking much better than they were but nonetheless she still is noticing that the Betadine seems to be keeping things a little bit dry but some areas are starting to drain a lot more unfortunately. She is on aspirin and Plavix following the stent placement. This is probably part of the bleeding issues that she had previously in the groin to be peripherally honest. She does have a history of COPD and coronary artery disease as well. [place], Lenise Herald ([id number]) [id number] Page 2 of 6 3/5; patient is in for her second clinic visit. She has significant PAD status post revascularization a month ago. According to the patient she is being scheduled for another 1 in a month's time. She has 4 wounds on the right foot lateral foot proximal foot fourth toe and the medial part of the right first toe 3/12; patient presents for follow-up. She has been using Xeroform to the wound beds. She  has no issues or complaints today. 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 03-10-2023  upon evaluation today patient presents for follow-up concerning her ongoing issues with her right lower extremity ulcers on foot. She has not seen vascular yet they called them but they have not actually gotten her scheduled. They state they are to call back and see if they can get things moving along. With that being said she does seem to be making some improvements with regard to her toes and I am very pleased in that regard I feel like the wounds are looking better today. 3B-26-24 upon evaluation today patient appears to be doing well currently in regard to her wounds all things considered I definitely see signs of improvement which is great news. Fortunately there does not appear to be any evidence of active infection locally nor systemically which is great news. No fevers, chills, nausea, vomiting, or diarrhea. 03-24-2023 upon evaluation today patient appears to be doing decently well in regard to her ulcers on her foot. Were down to just 2 and both are showing signs of improvement. Fortunately I do not see any evidence of active infection locally nor systemically which is great news. 03-31-2023 upon evaluation today patient's wounds actually are showing signs of improvement. I think both are doing well with the [facility] and I am very pleased in that regard. I do not see any signs of infection locally nor systemically which is great news. 04-07-2023 upon evaluation today patient actually seems to be making good progress here with regard to her wounds. She has been tolerating the dressing changes without complication. Fortunately there does not appear to be any signs of active infection locally nor systemically at this time which is great news. No fevers, chills, nausea, vomiting, or diarrhea. 04-21-2023 upon evaluation today patient appears to be doing well currently in regard to her wound in fact both wounds are measuring smaller and looking much better there is good to be some need for sharp  debridement but in general I think that she is really doing quite well. 05-12-2023 upon evaluation today patient appears to be doing well currently fortunately she does not seem to have any signs of active infection at this time. No fevers, chills, nausea, vomiting, or diarrhea. 05-22-2023 upon evaluation today patient appears to be doing well currently in regard to her wounds. She has been tolerating the dressing changes without complication. Fortunately I do not see any signs of active infection locally or systemically which is great news. 06-08-2023 evaluation today patient appears to be doing well currently She appears to be completely healed I am seeing. I think that she has done extremely well up to this point. I am extremely pleased for her that she is healed. Electronic Signature(s) Signed: 06/08/2023 2:29:01 PM By: Allen Derry PA-C Entered By: Allen Derry on 06/08/2023 14:29:01 -------------------------------------------------------------------------------- Physical Exam Details Patient Name: Date of Service: [name], [name] 06/08/2023 2:00 PM Medical Record Number: [id number] Patient Account Number: [id number] Date of Birth/Sex: Treating RN: October 26, 1951 (72 y.o. [name] Primary Care Provider: PA Zenovia Jordan, [place] Other Clinician: Referring Provider: Treating Provider/Extender: Riccardo Dubin in Treatment: 16 Constitutional Obese and well-hydrated in no acute distress. Respiratory normal breathing without difficulty. Psychiatric this patient is able to make decisions and demonstrates good insight into disease process. Alert and Oriented x 3. pleasant and cooperative. Notes Upon inspection patient's wound bed actually showed signs of excellent healing in fact everything appears to be completely  closed which is great news. Electronic Signature(s) Signed: 06/08/2023 2:29:16 PM By: Allen Derry PA-C Entered By: Allen Derry on 06/08/2023 14:29:16 [name]  ([id number]) [id number] Page 3 of 6 -------------------------------------------------------------------------------- Physician Orders Details Patient Name: Date of Service: KENA, [name] 06/08/2023 2:00 PM Medical Record Number: [id number] Patient Account Number: [id number] Date of Birth/Sex: Treating RN: 1951/11/09 (72 y.o. 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 Services Discharge from Wound Care Center Treatment Complete - continue to lotion healed areas and do your best not to pick at any of the healed areas. Electronic Signature(s) Signed: 06/08/2023 3:37:18 PM By: Allen Derry PA-C Signed: 06/08/2023 4:15:27 PM By: Angelina Pih Entered By: Angelina Pih on 06/08/2023 14:25:18 -------------------------------------------------------------------------------- Problem List Details Patient Name: Date of Service: [name], Falisa 06/08/2023 2:00 PM Medical Record Number: [id number] Patient Account Number: [id number] Date of Birth/Sex: Treating RN: May 07, 1951 (72 y.o. 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 02/16/2023 No Yes [name], [name] ([id number])  [id number] Page 4 of 6 [id number] Non-pressure chronic ulcer of other part of right foot with fat layer exposed 02/16/2023 No Yes Z79.01 Long term (current) use of anticoagulants 02/16/2023 No Yes J44.9 Chronic obstructive pulmonary disease, unspecified 02/16/2023 No Yes I25.10 Atherosclerotic heart disease of native coronary artery without angina pectoris 02/16/2023 No Yes Inactive Problems Resolved Problems Electronic Signature(s) Signed: 06/08/2023 2:13:12 PM By: Allen Derry PA-C Entered By: Allen Derry on 06/08/2023 14:13:12 -------------------------------------------------------------------------------- Progress Note Details Patient Name: Date of Service: [name], Lillyann 06/08/2023 2:00 PM Medical Record Number: [id number] Patient Account Number: [id number] Date of Birth/Sex: Treating RN: January 01, 1951 (72 y.o. [name] Primary Care Provider: PA TIENT, [place] Other Clinician: Referring Provider: Treating Provider/Extender: Riccardo Dubin in Treatment: 16 Subjective Chief Complaint Information obtained from Patient Right foot ulcers History of Present Illness (HPI) 02-16-2023 upon evaluation today patient presents for initial inspection here in our clinic concerning issues that she has been having with wounds over the right foot in the realm of several toes as well as the lateral portion of her foot fifth metatarsal location. Subsequently she unfortunately has somewhat poor arterial flow. I do have notes that I reviewed in epic and subsequently the patient did have an angiogram as well back in January. The testing following the angiogram revealed after stent placement on 01-09-2023 that she subsequently still had somewhat compromised arterial flow following with a right ABI of 0.70 with a TBI of 0.34 and on the left an ABI of 0.76 and the TBI could not be measured. 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 With that being said the follow-up as far  as her arterial testing they did not do ABIs and TBI's but it showed that she was monophasic pretty much throughout and in general I think this still shows that there is a significant issue here and the vascular doctors seem to be in agreement as far as this is  concerned they have recommended that she really needs to have repeat arterial angiogram with likely intervention to try to improve that monophasic flow. With that being said the patient is somewhat reluctant to do this though I think that she is leaning towards it even though she had a lot of bleeding issues in the groin area following the last time she had this done. In regard to her wound she is actually having quite a bit of an issue here with these wounds specifically in the realm of getting them to turn around and heal appropriately. She tells me that the areas are looking much better than they were but nonetheless she still is noticing that the Betadine seems to be keeping things a little bit dry but some areas are starting to drain a lot more unfortunately. She is on aspirin and Plavix following the stent placement. This is probably part of the bleeding issues that she had previously in the groin to be peripherally honest. She does have a history of COPD and coronary artery disease as well. 3/5; patient is in for her second clinic visit. She has significant PAD status post revascularization a month ago. According to the patient she is being scheduled for another 1 in a month's time. She has 4 wounds on the right foot lateral foot proximal foot fourth toe and the medial part of the right first toe 3/12; patient presents for follow-up. She has been using Xeroform to the wound beds. She has no issues or complaints today. 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 03-10-2023 upon evaluation today patient presents for follow-up concerning her ongoing issues with her right lower extremity ulcers on foot. She  has not seen vascular yet they called them but they have not actually gotten her scheduled. They state they are to call back and see if they can get things moving along. [place], Lenise Herald ([id number]) [id number] Page 5 of 6 With that being said she does seem to be making some improvements with regard to her toes and I am very pleased in that regard I feel like the wounds are looking better today. 3B-26-24 upon evaluation today patient appears to be doing well currently in regard to her wounds all things considered I definitely see signs of improvement which is great news. Fortunately there does not appear to be any evidence of active infection locally nor systemically which is great news. No fevers, chills, nausea, vomiting, or diarrhea. 03-24-2023 upon evaluation today patient appears to be doing decently well in regard to her ulcers on her foot. Were down to just 2 and both are showing signs of improvement. Fortunately I do not see any evidence of active infection locally nor systemically which is great news. 03-31-2023 upon evaluation today patient's wounds actually are showing signs of improvement. I think both are doing well with the [facility] and I am very pleased in that regard. I do not see any signs of infection locally nor systemically which is great news. 04-07-2023 upon evaluation today patient actually seems to be making good progress here with regard to her wounds. She has been tolerating the dressing changes without complication. Fortunately there does not appear to be any signs of active infection locally nor systemically at this time which is great news. No fevers, chills, nausea, vomiting, or diarrhea. 04-21-2023 upon evaluation today patient appears to be doing well currently in regard to her wound in fact both wounds are measuring smaller and looking much better there is good to  be some need for sharp debridement but in general I think that she is really  doing quite well. 05-12-2023 upon evaluation today patient appears to be doing well currently fortunately she does not seem to have any signs of active infection at this time. No fevers, chills, nausea, vomiting, or diarrhea. 05-22-2023 upon evaluation today patient appears to be doing well currently in regard to her wounds. She has been tolerating the dressing changes without complication. Fortunately I do not see any signs of active infection locally or systemically which is great news. 06-08-2023 evaluation today patient appears to be doing well currently She appears to be completely healed I am seeing. I think that she has done extremely well up to this point. I am extremely pleased for her that she is healed. Objective Constitutional Obese and well-hydrated in no acute distress. Vitals Time Taken: 2:07 PM, Height: 62 in, Weight: 162 lbs, BMI: 29.6, Temperature: 98.3 F, Pulse: 82 bpm, Respiratory Rate: 18 breaths/min, Blood Pressure: 127/79 mmHg. Respiratory normal breathing without difficulty. Psychiatric this patient is able to make decisions and demonstrates good insight into disease process. Alert and Oriented x 3. pleasant and cooperative. General Notes: Upon inspection patient's wound bed actually showed signs of excellent healing in fact everything appears to be completely closed which is great news. Integumentary (Hair, Skin) Wound #2 status is Healed - Epithelialized. Original cause of wound was Pressure Injury. The date acquired was: 12/29/2022. The wound has been in treatment 16 weeks. The wound is located on the Right,Lateral Foot. The wound measures 0cm length x 0cm width x 0cm depth; 0cm^2 area and 0cm^3 volume. There is no tunneling or undermining noted. There is a none present amount of drainage noted. There is no granulation within the wound bed. There is no necrotic tissue within the wound bed. 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 [place], Lenise Herald ([id number]) [id number] Page 6 of 6 1. I would recommend currently that we have the patient continue to monitor for any signs of infection or worsening. Based on what I am seeing I do believe that she is actually making really good progress towards complete closure in fact I feel like this is completely healed. 2. I am good recommend that she continue to utilize lotion or AandD ointment In order to keep everything nice and under control from the standpoint of dry skin. We will see patient back for reevaluation in 1 week here in the clinic. If anything worsens or changes patient will contact our office for additional recommendations. Electronic Signature(s) Signed: 06/08/2023 2:30:09 PM By: Allen Derry PA-C Entered By: Allen Derry on 06/08/2023 14:30:09 -------------------------------------------------------------------------------- SuperBill Details Patient Name: Date of Service: [name], [name] 06/08/2023 Medical Record Number: [id number] Patient Account Number: [id number] Date of Birth/Sex: Treating RN: 1951-02-04 (72 y.o. 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 Z79.01 Long term (current) use of  anticoagulants J44.9 Chronic obstructive  pulmonary disease, unspecified I25.10 Atherosclerotic heart disease of native coronary artery without angina pectoris Facility Procedures : CPT4 Code: [id number] Description: 54098 - WOUND CARE VISIT-LEV 2 EST PT Modifier: Quantity: 1 Physician Procedures : CPT4 Code Description Modifier [id number] - WC PHYS LEVEL 3 - EST PT ICD-10 Diagnosis Description 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

## 2023-06-08 NOTE — Progress Notes (Signed)
[place], Lenise Herald ([id number]) [id number] Page 1 of 8 Visit Report for 06/08/2023 Arrival Information Details Patient Name: Date of Service: RIANE, ARCEO 06/08/2023 2:00 PM Medical Record Number: [id number] Patient Account Number: [id number] Date of Birth/Sex: Treating RN: 03/08/1951 (72 y.o. [name] Primary Care Barbie Croston: PA Zenovia Jordan, [place] Other Clinician: Referring Zionna Homewood: Treating Cambrie Sonnenfeld/Extender: Riccardo Dubin in Treatment: 16 Visit Information History Since Last Visit Added or deleted any medications: No Patient Arrived: Wheel Chair Any new allergies or adverse reactions: No Arrival Time: 14:07 Had a fall or experienced change in No Accompanied By: daughter activities of daily living that may affect Transfer Assistance: EasyPivot Patient Lift risk of falls: Patient Identification Verified: Yes Hospitalized since last visit: No Secondary Verification Process Completed: Yes Has Dressing in Place as Prescribed: Yes Patient Requires Transmission-Based Precautions: No Pain Present Now: No Patient Has Alerts: Yes Patient Alerts: Patient on Blood Thinner PLAVIX ASA 81 mg R ABI 0.70 TBI 0.34 L ABI 0.76 TBI 0.00 Electronic Signature(s) Signed: 06/08/2023 4:15:27 PM By: Angelina Pih Entered By: Angelina Pih on 06/08/2023 14:07:21 -------------------------------------------------------------------------------- Clinic Level of Care Assessment Details Patient Name: Date of Service: MARQUETTE, ROHWEDDER 06/08/2023 2:00 PM Medical Record Number: [id number] Patient Account Number: [id number] Date of Birth/Sex: Treating RN: January 03, 1951 (72 y.o. [name] Primary Care Zorah Backes: PA Zenovia Jordan, [place] Other Clinician: Referring Delvecchio Madole: Treating Tell Rozelle/Extender: Riccardo Dubin in Treatment: 16 Clinic Level of Care Assessment Items TOOL 4 Quantity Score []  - 0 Use when only an EandM is performed on FOLLOW-UP  visit ASSESSMENTS - Nursing Assessment / Reassessment X- 1 10 Reassessment of Co-morbidities (includes updates in patient status) X- 1 5 Reassessment of Adherence to Treatment Plan [place], Lenise Herald ([id number]) [id number] Page 2 of 8 ASSESSMENTS - Wound and Skin A ssessment / Reassessment X - Simple Wound Assessment / Reassessment - one wound 1 5 []  - 0 Complex Wound Assessment / Reassessment - multiple wounds []  - 0 Dermatologic / Skin Assessment (not related to wound area) ASSESSMENTS - Focused Assessment []  - 0 Circumferential Edema Measurements - multi extremities []  - 0 Nutritional Assessment / Counseling / Intervention []  - 0 Lower Extremity Assessment (monofilament, tuning fork, pulses) []  - 0 Peripheral Arterial Disease Assessment (using hand held doppler) ASSESSMENTS - Ostomy and/or Continence Assessment and Care []  - 0 Incontinence Assessment and Management []  - 0 Ostomy Care Assessment and Management (repouching, etc.) PROCESS - Coordination of Care X - Simple Patient / Family Education for ongoing care 1 15 []  - 0 Complex (extensive) Patient / Family Education for ongoing care X- 1 10 Staff obtains Chiropractor, Records, T Results / Process Orders est []  - 0 Staff telephones HHA, Nursing Homes / Clarify orders / etc []  - 0 Routine Transfer to another Facility (non-emergent condition) []  - 0 Routine Hospital Admission (non-emergent condition) []  - 0 New Admissions / Manufacturing engineer / Ordering NPWT Apligraf, etc. , []  - 0 Emergency Hospital Admission (emergent condition) X- 1 10 Simple Discharge Coordination []  - 0 Complex (extensive) Discharge Coordination PROCESS - Special Needs []  - 0 Pediatric / Minor Patient Management []  - 0 Isolation Patient Management []  - 0 Hearing / Language / Visual special needs []  - 0 Assessment of Community assistance (transportation, D/C planning, etc.) []  - 0 Additional assistance /  Altered mentation []  - 0 Support Surface(s) Assessment (bed, cushion, seat, etc.) INTERVENTIONS - Wound Cleansing / Measurement X - Simple Wound Cleansing - one wound 1 5 []  - 0 Complex  Wound Cleansing - multiple wounds X- 1 5 Wound Imaging (photographs - any number of wounds) []  - 0 Wound Tracing (instead of photographs) X- 1 5 Simple Wound Measurement - one wound []  - 0 Complex Wound Measurement - multiple wounds INTERVENTIONS - Wound Dressings []  - 0 Small Wound Dressing one or multiple wounds []  - 0 Medium Wound Dressing one or multiple wounds []  - 0 Large Wound Dressing one or multiple wounds []  - 0 Application of Medications - topical []  - 0 Application of Medications - injection INTERVENTIONS - Miscellaneous []  - 0 External ear exam CH, Jina ([id number]) [id number] Page 3 of 8 []  - 0 Specimen Collection (cultures, biopsies, blood, body fluids, etc.) []  - 0 Specimen(s) / Culture(s) sent or taken to Lab for analysis []  - 0 Patient Transfer (multiple staff / Michiel Sites Lift / Similar devices) []  - 0 Simple Staple / Suture removal (25 or less) []  - 0 Complex Staple / Suture removal (26 or more) []  - 0 Hypo / Hyperglycemic Management (close monitor of Blood Glucose) []  - 0 Ankle / Brachial Index (ABI) - do not check if billed separately X- 1 5 Vital Signs Has the patient been seen at the hospital within the last three years: Yes Total Score: 75 Level Of Care: New/Established - Level 2 Electronic Signature(s) Signed: 06/08/2023 4:15:27 PM By: Angelina Pih Entered By: Angelina Pih on 06/08/2023 14:25:43 -------------------------------------------------------------------------------- Encounter Discharge Information Details Patient Name: Date of Service: [name], Alayha 06/08/2023 2:00 PM Medical Record Number: [id number] Patient Account Number: [id number] Date of Birth/Sex: Treating RN: 19-Jun-1951 (72 y.o. [name] Primary  Care Leslye Puccini: PA Zenovia Jordan, [place] Other Clinician: Referring Kayelyn Lemon: Treating Kaylen Motl/Extender: Riccardo Dubin in Treatment: 16 Encounter Discharge Information Items Discharge Condition: Stable Ambulatory Status: Wheelchair Discharge Destination: Home Transportation: Private Auto Accompanied By: daughter Schedule Follow-up Appointment: No Clinical Summary of Care: Electronic Signature(s) Signed: 06/08/2023 4:15:27 PM By: Angelina Pih Entered By: Angelina Pih on 06/08/2023 14:26:38 -------------------------------------------------------------------------------- Lower Extremity Assessment Details Patient Name: Date of Service: [name], Chenita 06/08/2023 2:00 PM [place], Lenise Herald ([id number]) [id number] Page 4 of 8 Medical Record Number: [id number] Patient Account Number: [id number] Date of Birth/Sex: Treating RN: 01/02/1951 (72 y.o. [name] Primary Care Chancey Ringel: PA Zenovia Jordan, [place] Other Clinician: Referring Maebel Marasco: Treating Gaylin Osoria/Extender: Riccardo Dubin in Treatment: 16 Vascular Assessment Pulses: Dorsalis Pedis Palpable: [Right:Yes] Electronic Signature(s) Signed: 06/08/2023 4:15:27 PM By: Angelina Pih Entered By: Angelina Pih on 06/08/2023 14:12:19 -------------------------------------------------------------------------------- Multi Wound Chart Details Patient Name: Date of Service: [name], Ivania 06/08/2023 2:00 PM Medical Record Number: [id number] Patient Account Number: [id number] Date of Birth/Sex: Treating RN: 08-12-1951 (72 y.o. 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 Primary Etiology: Asthma, Chronic Obstructive N/A N/A Comorbid History: Pulmonary Disease (COPD), Coronary Artery Disease 12/29/2022 N/A N/A Date Acquired: 16 N/A N/A Weeks of Treatment: Healed - Epithelialized N/A N/A Wound Status: No N/A N/A Wound Recurrence: Yes N/A N/A Pending A mputation on Presentation: 0x0x0 N/A N/A Measurements L x W x D (cm) 0 N/A N/A A (cm) : rea 0 N/A N/A Volume (cm) : 100.00% N/A N/A % Reduction in A rea: 100.00% N/A  N/A % Reduction in Volume: Full Thickness Without Exposed N/A N/A Classification: Support Structures [place], [place] ([id number]) [id number] Page 5 of 8 None Present N/A N/A Exudate Amount: None Present (0%) N/A N/A Granulation Amount: None Present (0%) N/A N/A Necrotic Amount: Fascia: No N/A N/A Exposed Structures: Fat Layer (Subcutaneous Tissue): No Tendon: No Muscle: No Joint: No Bone: No Large (67-100%) N/A N/A Epithelialization: Treatment Notes Electronic Signature(s) Signed: 06/08/2023 4:15:27 PM By: Angelina Pih Entered By: Angelina Pih on 06/08/2023 14:24:35 -------------------------------------------------------------------------------- Multi-Disciplinary Care Plan Details Patient Name: Date of Service: [name], Anushree 06/08/2023 2:00 PM Medical Record Number: [id number] Patient Account Number: [id number] Date of Birth/Sex: Treating RN: 01-06-1951 (72 y.o. [name] Primary Care Ivanka Kirshner: PA Zenovia Jordan, [place] Other Clinician: Referring Anevay Campanella: Treating Caily Rakers/Extender: Riccardo Dubin in Treatment: 16 Active Inactive Electronic Signature(s) Signed: 06/08/2023 4:15:27 PM By: Angelina Pih Entered By: Angelina Pih on 06/08/2023  14:25:59 -------------------------------------------------------------------------------- Pain Assessment Details Patient Name: Date of Service: ARIYEL, DALSING 06/08/2023 2:00 PM Medical Record Number: [id number] Patient Account Number: [id number] Date of Birth/Sex: Treating RN: [birth year] (72 y.o. [name] Primary Care Delisia Mcquiston: PA Zenovia Jordan, [place] Other Clinician: Referring Ledford Goodson: Treating Coston Mandato/Extender: Riccardo Dubin in Treatment: 16 Active Problems Location of Pain Severity and Description of Pain Patient Has Paino No JAMILYNN, ROFFERS ([id number]) [id number] Page 6 of 8 Patient Has Paino No Site Locations Rate the pain. Current Pain Level: 0 Pain Management and Medication Current Pain Management: Electronic Signature(s) Signed: 06/08/2023 4:15:27 PM By: Angelina Pih Entered By: Angelina Pih on 06/08/2023 14:07:52 -------------------------------------------------------------------------------- Patient/Caregiver Education Details Patient Name: Date of Service: [name], Yasmine 6/17/2024andnbsp2:00 PM Medical Record Number: [id number] Patient Account Number: [id number] Date of Birth/Gender: Treating RN: 1951-10-29 (72 y.o. [name] Primary Care Physician: PA Zenovia Jordan, [place] Other Clinician: Referring Physician: Treating Physician/Extender: Riccardo Dubin in Treatment: 16 Education Assessment Education Provided To: Patient Education Topics Provided Wound/Skin Impairment: Handouts: Other: care of healed wound Methods: Explain/Verbal Responses: State content correctly Electronic Signature(s) Signed: 06/08/2023 4:15:27 PM By: Angelina Pih Entered By: Angelina Pih on 06/08/2023 14:26:13 [name] ([id number]) [id number] Page 7 of 8 -------------------------------------------------------------------------------- Wound Assessment Details Patient Name: Date of  Service: RODNESHA, ENGHOLM 06/08/2023 2:00 PM Medical Record Number: [id number] Patient Account Number: [id number] Date of Birth/Sex: Treating RN: 16-Oct-1951 (72 y.o. [name] Primary Care Momoka Stringfield: PA TI[place], [place] Other Clinician: Referring Teriyah Purington: Treating Donalda Job/Extender: Riccardo Dubin in Treatment: 16 Wound Status Wound Number: 2 Primary Neuropathic Ulcer-Non Diabetic Etiology: Wound Location: Right, Lateral Foot Wound Healed - Epithelialized Wounding Event: Pressure Injury Status: Date Acquired: 12/29/2022 Comorbid Asthma, Chronic Obstructive Pulmonary Disease (COPD), Weeks Of Treatment: 16 History: Coronary Artery Disease Clustered Wound: No Pending Amputation On Presentation Photos Wound Measurements Length: (cm) Width: (cm) Depth: (cm) Area: (cm) Volume: (cm) 0 % Reduction in Area: 100% 0 % Reduction in Volume: 100% 0 Epithelialization: Large (67-100%) 0 Tunneling: No 0 Undermining: No Wound Description Classification: Full Thickness Without Exposed Suppor Exudate Amount: None Present t Structures Foul Odor After Cleansing: No Slough/Fibrino No Wound Bed Granulation Amount: None Present (0%) Exposed Structure Necrotic Amount: None Present (0%) Fascia Exposed: No Fat Layer (Subcutaneous Tissue) Exposed: No Tendon Exposed: No Muscle Exposed: No Joint Exposed: No Bone Exposed: No Treatment Notes Wound #2 (Foot) Wound Laterality: Right, Lateral Cleanser Peri-Wound Care Topical [place], Denette ([id number]) [id number] Page 8 of 8 Primary Dressing Secondary Dressing Secured With Compression Wrap Compression Stockings Add-Ons Electronic Signature(s) Signed:  06/08/2023 4:15:27 PM By: Angelina Pih Entered By: Angelina Pih on 06/08/2023 14:24:29 -------------------------------------------------------------------------------- Vitals Details Patient Name: Date of Service: [name], Shalie 06/08/2023 2:00  PM Medical Record Number: [id number] Patient Account Number: [id number] Date of Birth/Sex: Treating RN: 12-16-1951 (72 y.o. [name] Primary Care Nitya Cauthon: PA TI[place], [place] Other Clinician: Referring Kamil Mchaffie: Treating Luara Faye/Extender: Riccardo Dubin in Treatment: 16 Vital Signs Time Taken: 14:07 Temperature (F): 98.3 Height (in): 62 Pulse (bpm): 82 Weight (lbs): 162 Respiratory Rate (breaths/min): 18 Body Mass Index (BMI): 29.6 Blood Pressure (mmHg): 127/79 Reference Range: 80 - 120 mg / dl Electronic Signature(s) Signed: 06/08/2023 4:15:27 PM By: Angelina Pih Entered By: Angelina Pih on 06/08/2023 14:07:45

## 2023-06-08 NOTE — Progress Notes (Signed)
[place], [name] ([id number]) [id number] Page 1 of 8 Visit Report for 05/22/2023 Chief Complaint Document Details Patient Name: Date of Service: [name], [name] 05/22/2023 12:00 PM Medical Record Number: [id number] Patient Account Number: [id number] Date of Birth/Sex: Treating RN: 10-Apr-1951 (72 y.o. Skip Mayer Primary Care Provider: PA Zenovia Jordan, [place] Other Clinician: Betha Loa Referring Provider: Treating Provider/Extender: Riccardo Dubin in Treatment: 13 Information Obtained from: Patient Chief Complaint Right foot ulcers Electronic Signature(s) Signed: 05/22/2023 12:25:43 PM By: Allen Derry PA-C Entered By: Allen Derry on 05/22/2023 12:25:42 -------------------------------------------------------------------------------- Debridement Details Patient Name: Date of Service: [name], [name] 05/22/2023 12:00 PM Medical Record Number: [id number] Patient Account Number: [id number] Date of Birth/Sex: Treating RN: 1951-04-19 (72 y.o. Skip Mayer Primary Care Provider: PA Zenovia Jordan, [place] Other Clinician: Betha Loa Referring Provider: Treating Provider/Extender: Riccardo Dubin in Treatment: 13 Debridement Performed for Assessment: Wound #2 Right,Lateral Foot Performed By: Physician Allen Derry, PA-C Debridement Type: Debridement Level of Consciousness (Pre-procedure): Awake and Alert Pre-procedure Verification/Time Out Yes - 12:30 Taken: Start Time: 12:30 Percent of Wound Bed Debrided: 100% T Area Debrided (cm): otal 0.01 Tissue and other material debrided: Viable, Non-Viable, Skin: Dermis , Skin: Epidermis Level: Skin/Epidermis Debridement Description: Selective/Open Wound Instrument: Curette Bleeding: Minimum Hemostasis Achieved: Pressure Response to Treatment: Procedure was tolerated well Level of Consciousness (Post- Awake and Alert procedure): [place], [name] ([id number]) [id number]  Page 2 of 8 Post Debridement Measurements of Total Wound Length: (cm) 0.1 Width: (cm) 0.1 Depth: (cm) 0.1 Volume: (cm) 0.001 Character of Wound/Ulcer Post Debridement: Improved Post Procedure Diagnosis Same as Pre-procedure Electronic Signature(s) Signed: 05/22/2023 12:46:48 PM By: Betha Loa Signed: 05/22/2023 1:24:57 PM By: Allen Derry PA-C Signed: 05/25/2023 9:12:20 AM By: Elliot Gurney, BSN, RN, CWS, Kim RN, BSN Entered By: Betha Loa on 05/22/2023 12:30:37 -------------------------------------------------------------------------------- HPI Details Patient Name: Date of Service: [name], [name] 05/22/2023 12:00 PM Medical Record Number: [id number] Patient Account Number: [id number] Date of Birth/Sex: Treating RN: 02-22-1951 (72 y.o. Skip Mayer Primary Care Provider: PA Zenovia Jordan, [place] Other Clinician: Betha Loa Referring Provider: Treating Provider/Extender: Riccardo Dubin in Treatment: 13 History of Present Illness HPI Description: 02-16-2023 upon evaluation today patient presents for initial inspection here in our clinic concerning issues that she has been having with wounds over the right foot in the realm of several toes as well as the lateral portion of her foot fifth metatarsal location. Subsequently she unfortunately has somewhat poor arterial flow. I do have notes that I reviewed in epic and subsequently the patient did have an angiogram as well back in January. The testing following the angiogram revealed after stent placement on 01-09-2023 that she subsequently still had somewhat compromised arterial flow following with a right ABI of 0.70 with a TBI of 0.34 and on the left an ABI of 0.76 and the TBI could not be measured. 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 With that being said the follow-up as far as her arterial testing they did not do ABIs and TBI's but it showed that she was monophasic pretty much throughout and in general I think this still shows  that there is a significant issue here and the vascular doctors seem to be in agreement as far as this is concerned they have recommended that she really needs to have repeat arterial angiogram with likely intervention to try to improve that monophasic flow. With that being said the patient is somewhat reluctant to do this though I think that she is  leaning towards it even though she had a lot of bleeding issues in the groin area following the last time she had this done. In regard to her wound she is actually having quite a bit of an issue here with these wounds specifically in the realm of getting them to turn around and heal appropriately. She tells me that the areas are looking much better than they were but nonetheless she still is noticing that the Betadine seems to be keeping things a little bit dry but some areas are starting to drain a lot more unfortunately. She is on aspirin and Plavix following the stent placement. This is probably part of the bleeding issues that she had previously in the groin to be peripherally honest. She does have a history of COPD and coronary artery disease as well. 3/5; patient is in for her second clinic visit. She has significant PAD status post revascularization a month ago. According to the patient she is being scheduled for another 1 in a month's time. She has 4 wounds on the right foot lateral foot proximal foot fourth toe and the medial part of the right first toe 3/12; patient presents for follow-up. She has been using Xeroform to the wound beds. She has no issues or complaints today. 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 03-10-2023 upon evaluation today patient presents for follow-up concerning her ongoing issues with her right lower extremity ulcers on foot. She has not seen vascular yet they called them but they have not actually gotten her scheduled. They state they are to call back and see if they can get things  moving along. With that being said she does seem to be making some improvements with regard to her toes and I am very pleased in that regard I feel like the wounds are looking better today. 3B-26-24 upon evaluation today patient appears to be doing well currently in regard to her wounds all things considered I definitely see signs of improvement which is great news. Fortunately there does not appear to be any evidence of active infection locally nor systemically which is great news. No fevers, chills, nausea, vomiting, or diarrhea. 03-24-2023 upon evaluation today patient appears to be doing decently well in regard to her ulcers on her foot. Were down to just 2 and both are showing signs of improvement. Fortunately I do not see any evidence of active infection locally nor systemically which is great news. 03-31-2023 upon evaluation today patient's wounds actually are showing signs of improvement. I think both are doing well with the [facility] and I am very pleased in that regard. I do not see any signs of infection locally nor systemically which is great news. 04-07-2023 upon evaluation today patient actually seems to be making good progress here with regard to her wounds. She has been tolerating the dressing McRoberts, [place] ([id number]) [id number] Page 3 of 8 changes without complication. Fortunately there does not appear to be any signs of active infection locally nor systemically at this time which is great news. No fevers, chills, nausea, vomiting, or diarrhea. 04-21-2023 upon evaluation today patient appears to be doing well currently in regard to her wound in fact both wounds are measuring smaller and looking much better there is good to be some need for sharp debridement but in general I think that she is really doing quite well. 05-12-2023 upon evaluation today patient appears to be doing well currently fortunately she does not seem to have any signs of active  infection at this time. No fevers, chills, nausea, vomiting, or diarrhea. 05-22-2023 upon evaluation today patient appears to be doing well currently in regard to her wounds. She has been tolerating the dressing changes without complication. Fortunately I do not see any signs of active infection locally or systemically which is great news. Electronic Signature(s) Signed: 05/22/2023 12:55:50 PM By: Allen Derry PA-C Entered By: Allen Derry on 05/22/2023 12:55:50 -------------------------------------------------------------------------------- Physical Exam Details Patient Name: Date of Service: [name], [name] 05/22/2023 12:00 PM Medical Record Number: [id number] Patient Account Number: [id number] Date of Birth/Sex: Treating RN: [birth year] (72 y.o. Skip Mayer Primary Care Provider: PA Zenovia Jordan, [place] Other Clinician: Betha Loa Referring Provider: Treating Provider/Extender: Riccardo Dubin in Treatment: 13 Constitutional Well-nourished and well-hydrated in no acute distress. Respiratory normal breathing without difficulty. Psychiatric this patient is able to make decisions and demonstrates good insight into disease process. Alert and Oriented x 3. pleasant and cooperative. Notes Upon inspection patient's wound bed actually showed signs of excellent granulation epithelization at this point. Fortunately I do believe that we are moving in the appropriate direction and very pleased in that regard and I do think that we can continue as such with the wound care measures as before. We are using Hydrofera Blue over the lateral right foot which I think will probably be healed by next week. Electronic Signature(s) Signed: 05/22/2023 12:56:16 PM By: Allen Derry PA-C Entered By: Allen Derry on 05/22/2023 12:56:15 -------------------------------------------------------------------------------- Physician Orders Details Patient Name: Date of Service: [name], [name] 05/22/2023 12:00  PM Medical Record Number: [id number] Patient Account Number: [id number] Date of Birth/Sex: Treating RN: [birth year] (72 y.o. 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 Bathing/ [employer] wounds with antibacterial soap and water. Anesthetic (Use 'Patient Medications' Section for Anesthetic Order Entry) Lidocaine applied to wound bed Edema Control - Lymphedema / Segmental Compressive Device / Other Elevate, Exercise Daily and A void Standing for Long Periods of Time. Elevate legs to the level of the heart and pump ankles as often as possible Elevate leg(s) parallel to the floor when sitting. DO YOUR BEST to sleep in the bed at night. DO NOT sleep in your recliner. Long hours of sitting in a recliner leads to swelling of the legs and/or potential wounds on your backside. Wound Treatment Wound #2 - Foot Wound Laterality: Right, Lateral Cleanser: Soap and Water 3 x Per Week/30 Days Discharge Instructions: Gently cleanse wound with antibacterial soap, rinse and pat dry prior to dressing wounds Prim Dressing: Hydrofera Blue Ready Transfer Foam, 2.5x2.5 (in/in) 3 x Per Week/30 Days ary Discharge Instructions: Apply Hydrofera Blue Ready to wound bed as directed Secondary  Dressing: Coverlet Latex-Free Fabric Adhesive Dressings 3 x Per Week/30 Days Discharge Instructions: 1.5 x 2 Electronic Signature(s) Signed: 05/22/2023 12:46:48 PM By: Betha Loa Signed: 05/22/2023 1:24:57 PM By: Allen Derry PA-C Entered By: Betha Loa on 05/22/2023 12:31:21 -------------------------------------------------------------------------------- Problem List Details Patient Name: Date of Service: Victoria  Rowland, [name] 05/22/2023 12:00 PM Medical Record Number: [id number] Patient Account Number: [id number] Date of Birth/Sex: Treating RN: 28-Jun-1951 (72 y.o. Skip Mayer Primary Care Provider: PA Zenovia Jordan, [place] Other Clinician: Betha Loa Referring Provider: Treating Provider/Extender: Riccardo Dubin in Treatment: [address] 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 02/16/2023 No Yes L97.512 Non-pressure chronic ulcer of other part of right foot with fat layer exposed 02/16/2023 No Yes Z79.01 Long term (current) use of anticoagulants 02/16/2023 No Yes J44.9 Chronic obstructive pulmonary disease, unspecified 02/16/2023 No Yes I25.10 Atherosclerotic heart disease of native coronary artery without angina pectoris 02/16/2023 No Yes Inactive Problems Resolved Problems Electronic Signature(s) Signed: 05/22/2023 12:25:35 PM By: Allen Derry PA-C Entered By: Allen Derry on 05/22/2023 12:25:35 -------------------------------------------------------------------------------- Progress Note Details Patient Name: Date of Service: [name], [name] 05/22/2023 12:00 PM Medical Record Number: [id number] Patient Account Number: [id number] Date of Birth/Sex: Treating RN: 30-Jan-1951 (72 y.o. Skip Mayer Primary Care Provider: PA Zenovia Jordan, [place] Other Clinician: Betha Loa Referring Provider: Treating Provider/Extender: Riccardo Dubin in Treatment: 13 Subjective Chief Complaint Information obtained from Patient Right foot ulcers History of Present Illness (HPI) 02-16-2023 upon evaluation today patient presents for initial inspection here in our clinic concerning issues that she has been having with wounds over the right foot in the realm of several toes as well as the lateral portion of her foot fifth metatarsal location. Subsequently she unfortunately has somewhat poor arterial flow. I do have notes that I reviewed in epic and subsequently the patient did have an angiogram as well back in January. The testing following the angiogram revealed after stent placement on 01-09-2023 that she subsequently still had somewhat compromised arterial flow following with a right ABI of 0.70 with a TBI of 0.34 and on the left an ABI of 0.76 and the TBI could not be measured. 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 With that being said the follow-up as far as her arterial testing they did not do ABIs and TBI's but it showed that she was monophasic pretty much throughout and in general I think this still shows that there is a significant issue here and the vascular doctors seem to be in agreement as far as this is concerned they have recommended that she really needs to have repeat arterial angiogram with likely intervention to try to improve that monophasic flow. With that being said the patient is somewhat reluctant to do this though I think that she is leaning towards it even though she had a lot of bleeding issues in the groin area following the last time she had this done. In regard to her wound she is actually having quite a bit of an issue here with these wounds specifically in the realm of getting them to turn around and heal appropriately. She tells me that the areas are looking much better than they were but nonetheless she still is noticing that the Betadine seems to be keeping things a little bit dry but some areas are  starting to drain a lot more unfortunately. She is on aspirin and Plavix following the stent placement. This is probably part of the bleeding issues that she had previously in the groin to be peripherally honest. She does have a history of COPD and coronary artery disease as well. 3/5; patient is in for her second clinic visit. She has significant PAD status post revascularization a  month ago. According to the patient she is being scheduled [name], [name] ([id number]) [id number] Page 6 of 8 for another 1 in a month's time. She has 4 wounds on the right foot lateral foot proximal foot fourth toe and the medial part of the right first toe 3/12; patient presents for follow-up. She has been using Xeroform to the wound beds. She has no issues or complaints today. 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 03-10-2023 upon evaluation today patient presents for follow-up concerning her ongoing issues with her right lower extremity ulcers on foot. She has not seen vascular yet they called them but they have not actually gotten her scheduled. They state they are to call back and see if they can get things moving along. With that being said she does seem to be making some improvements with regard to her toes and I am very pleased in that regard I feel like the wounds are looking better today. 3B-26-24 upon evaluation today patient appears to be doing well currently in regard to her wounds all things considered I definitely see signs of improvement which is great news. Fortunately there does not appear to be any evidence of active infection locally nor systemically which is great news. No fevers, chills, nausea, vomiting, or diarrhea. 03-24-2023 upon evaluation today patient appears to be doing decently well in regard to her ulcers on her foot. Were down to just 2 and both are showing signs of improvement. Fortunately I do not see any evidence of active  infection locally nor systemically which is great news. 03-31-2023 upon evaluation today patient's wounds actually are showing signs of improvement. I think both are doing well with the [facility] and I am very pleased in that regard. I do not see any signs of infection locally nor systemically which is great news. 04-07-2023 upon evaluation today patient actually seems to be making good progress here with regard to her wounds. She has been tolerating the dressing changes without complication. Fortunately there does not appear to be any signs of active infection locally nor systemically at this time which is great news. No fevers, chills, nausea, vomiting, or diarrhea. 04-21-2023 upon evaluation today patient appears to be doing well currently in regard to her wound in fact both wounds are measuring smaller and looking much better there is good to be some need for sharp debridement but in general I think that she is really doing quite well. 05-12-2023 upon evaluation today patient appears to be doing well currently fortunately she does not seem to have any signs of active infection at this time. No fevers, chills, nausea, vomiting, or diarrhea. 05-22-2023 upon evaluation today patient appears to be doing well currently in regard to her wounds. She has been tolerating the dressing changes without complication. Fortunately I do not see any signs of active infection locally or systemically which is great news. Objective Constitutional Well-nourished and well-hydrated in no acute distress. Vitals Time Taken: 12:14 PM, Height: 62 in, Weight: 162 lbs, BMI: 29.6, Temperature: 98.2 F, Pulse: 71 bpm, Respiratory Rate: 18 breaths/min, Blood Pressure: 122/75 mmHg. Respiratory normal breathing without difficulty. Psychiatric this patient is able to make decisions and demonstrates good insight into disease process. Alert and Oriented x 3. pleasant and cooperative. General Notes: Upon inspection patient's wound  bed actually showed signs of excellent granulation epithelization at this point. Fortunately I do believe that we are moving in the appropriate direction and very pleased in that regard and I do think that  we can continue as such with the wound care measures as before. We are using Hydrofera Blue over the lateral right foot which I think will probably be healed by next week. Integumentary (Hair, Skin) Wound #2 status is Open. Original cause of wound was Pressure Injury. The date acquired was: 12/29/2022. The wound has been in treatment 13 weeks. The wound is located on the Right,Lateral Foot. The wound measures 0.1cm length x 0.1cm width x 0.1cm depth; 0.008cm^2 area and 0.001cm^3 volume. There is Fat Layer (Subcutaneous Tissue) exposed. There is a medium amount of serosanguineous drainage noted. There is large (67-100%) red, pink granulation within the wound bed. There is a small (1-33%) amount of necrotic tissue within the wound bed. 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 There was a Selective/Open Wound Skin/Epidermis Debridement with a total area of 0.01 sq cm performed by Allen Derry, PA-C. With the following instrument(s): Curette to remove Viable and Non-Viable tissue/material. Material removed includes Skin: Dermis and Skin: Epidermis and. A time out was conducted at 12:30, prior to the start of the procedure. A Minimum amount of bleeding was controlled with Pressure. The procedure was tolerated well. Post  Debridement Measurements: 0.1cm length x 0.1cm width x 0.1cm depth; 0.001cm^3 volume. Character of Wound/Ulcer Post Debridement is improved. Post procedure Diagnosis Wound #2: Same as Pre-Procedure Plan Follow-up Appointments: Return Appointment in 1 week. Bathing/ Shower/ Hygiene: Wash wounds with antibacterial soap and water. Anesthetic (Use 'Patient Medications' Section for Anesthetic Order Entry): Lidocaine applied to wound bed Edema Control - Lymphedema / Segmental Compressive Device / Other: Elevate, Exercise Daily and Avoid Standing for Long Periods of Time. Elevate legs to the level of the heart and pump ankles as often as possible Elevate leg(s) parallel to the floor when sitting. DO YOUR BEST to sleep in the bed at night. DO NOT sleep in your recliner. Long hours of sitting in a recliner leads to swelling of the legs and/or potential wounds on your backside. WOUND #2: - Foot Wound Laterality: Right, Lateral Cleanser: Soap and Water 3 x Per Week/30 Days Discharge Instructions: Gently cleanse wound with antibacterial soap, rinse and pat dry prior to dressing wounds Prim Dressing: Hydrofera Blue Ready Transfer Foam, 2.5x2.5 (in/in) 3 x Per Week/30 Days ary Discharge Instructions: Apply Hydrofera Blue Ready to wound bed as directed Secondary Dressing: Coverlet Latex-Free Fabric Adhesive Dressings 3 x Per Week/30 Days Discharge Instructions: 1.5 x 2 1. I do believe that we will go ahead and continue with the [facility] for the lateral foot I think this might be healed but I will give it 1 more week before calling it. 2. Also can recommend that we continue with lotion to the foot in general. 3. I am also going to suggest that she continue to monitor for any signs of infection or worsening if anything changes she knows contact the office let me know. We will see patient back for reevaluation in 1 week here in the clinic. If anything worsens or changes patient will contact our  office for additional recommendations. Electronic Signature(s) Signed: 05/22/2023 12:56:43 PM By: Allen Derry PA-C Entered By: Allen Derry on 05/22/2023  12:56:43 -------------------------------------------------------------------------------- SuperBill Details Patient Name: Date of Service: [name], [name] 05/22/2023 Medical Record Number: [id number] Patient Account Number: [id number] Date of Birth/Sex: Treating RN: 1951-06-24 (72 y.o. 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 L97.512 Non-pressure chronic ulcer of other part of right foot with fat layer exposed [name], [name] ([id number]) [id number] Page 8 of 8 Z79.01 Long term (current) use of anticoagulants J44.9 Chronic obstructive pulmonary disease, unspecified I25.10 Atherosclerotic heart disease of native coronary artery without angina pectoris Facility Procedures : CPT4 Code: [id number] Description: 97597 - DEBRIDE WOUND 1ST 20 SQ CM OR < ICD-10 Diagnosis Description L97.512 Non-pressure chronic ulcer of other part of right foot with fat layer exposed Modifier: Quantity: 1 Physician Procedures : CPT4 Code Description Modifier [id number] 97597 - WC PHYS DEBR WO ANESTH 20 SQ CM ICD-10 Diagnosis Description L97.512 Non-pressure chronic ulcer of other part of right foot with fat layer exposed Quantity: 1 Electronic Signature(s) Signed: 05/22/2023 12:58:40 PM By: Allen Derry PA-C Entered By: Allen Derry on 05/22/2023 12:58:40

## 2023-07-03 ENCOUNTER — Inpatient Hospital Stay
Admission: RE | Admit: 2023-07-03 | Discharge: 2023-07-03 | Disposition: A | Discharge status: Home / Self Care | Payer: Medicare Other | Source: Ambulatory Visit

## 2023-07-03 HISTORY — DX: Prediabetes: R73.03

## 2023-07-03 HISTORY — DX: Solitary pulmonary nodule: R91.1

## 2023-07-03 HISTORY — DX: Cellulitis of right toe: L03.031

## 2023-07-03 HISTORY — DX: Other complications of anesthesia, initial encounter: T88.59XA

## 2023-07-03 HISTORY — DX: Nonspecific reaction to tuberculin skin test without active tuberculosis: R76.11

## 2023-07-03 HISTORY — DX: Encephalopathy, unspecified: G93.40

## 2023-07-03 HISTORY — DX: Unspecified chronic bronchitis: J42

## 2023-07-03 HISTORY — DX: Brown's sheath syndrome, right eye: H50.611

## 2023-07-03 HISTORY — DX: Anemia, unspecified: D64.9

## 2023-07-03 HISTORY — DX: Edema, unspecified: R60.9

## 2023-07-03 HISTORY — DX: Polyneuropathy, unspecified: G62.9

## 2023-07-03 HISTORY — DX: Tobacco use: Z72.0

## 2023-07-03 NOTE — Pre-Procedure Instructions (Signed)
Called pt multiple times to do anesthesia interview.  Unable to leave a message. States call cannot be completed at this time. Called pts daughter. Was unable to leave a message.

## 2023-07-06 ENCOUNTER — Inpatient Hospital Stay: Admission: RE | Admit: 2023-07-06 | Payer: Medicare Other | Source: Ambulatory Visit

## 2023-07-06 HISTORY — DX: Non-pressure chronic ulcer of other part of unspecified foot with unspecified severity: L97.509

## 2023-07-06 NOTE — Pre-Procedure Instructions (Signed)
Tried pt and daughter again for anesthesia interview but still unable to leave a message. [facility] is trying to get in touch with pt as well to inform her that her 1st dose of Ertapenem will be 10 am 07-10-23 in [facility]

## 2023-07-06 NOTE — Pre-Procedure Instructions (Signed)
I tried again today to reach pt for her anesthesia interview. Still states "call cannot be completed at this time" Called pts daughter. Her vm is full and was unable to leave a message. Informed Letta Kocher of this to see if she can reach out to pt and get her to call us back since we have been unsuccessful reaching her since Friday (7-12). Melissa said pt was very hard to get in touch with. I told Melissa if pt does not call back today then we will send her chart to [facility] and everything will have to be done dos since repeatedly calling pt for the last 2 days with no call back

## 2023-07-07 ENCOUNTER — Inpatient Hospital Stay
Admission: RE | Admit: 2023-07-07 | Discharge: 2023-07-07 | Disposition: A | Discharge status: Home / Self Care | Payer: Medicare Other | Source: Ambulatory Visit

## 2023-07-09 ENCOUNTER — Encounter
Admission: RE | Admit: 2023-07-09 | Discharge: 2023-07-09 | Disposition: A | Discharge status: Home / Self Care | Payer: Medicare Other | Source: Ambulatory Visit | Attending: Urology | Admitting: Urology

## 2023-07-09 HISTORY — DX: Other specified postprocedural states: Z98.890

## 2023-07-09 HISTORY — DX: Hallux valgus (acquired), unspecified foot: M20.10

## 2023-07-09 HISTORY — DX: Cortical age-related cataract, bilateral: H25.013

## 2023-07-09 HISTORY — DX: Other specified disorders of bone density and structure, unspecified site: M85.80

## 2023-07-09 HISTORY — DX: Essential (primary) hypertension: I10

## 2023-07-09 HISTORY — DX: Primary osteoarthritis, right ankle and foot: M19.071

## 2023-07-09 HISTORY — DX: Other hammer toe(s) (acquired), left foot: M20.42

## 2023-07-09 HISTORY — DX: Ventricular premature depolarization: I49.3

## 2023-07-09 HISTORY — DX: Carpal tunnel syndrome, unspecified upper limb: G56.00

## 2023-07-09 HISTORY — DX: Hyperlipidemia, unspecified: E78.5

## 2023-07-09 HISTORY — DX: Peripheral vascular disease, unspecified: I73.9

## 2023-07-09 HISTORY — DX: Other specified postprocedural states: R11.2

## 2023-07-09 NOTE — Patient Instructions (Addendum)
Your procedure is scheduled on: Monday, July 22 Report to the Registration Desk on the 1st floor of the [facility] To find out your arrival time, please call [phone] between 1PM - 3PM on: Friday, July 19 If your arrival time is 6:00 am, do not arrive before that time as the [facility] entrance doors do not open until 6:00 am.  REMEMBER: Instructions that are not followed completely may result in serious medical risk, up to and including death; or upon the discretion of your surgeon and anesthesiologist your surgery may need to be rescheduled.  Do not eat or drink after midnight the night before surgery.  No gum chewing or hard candies.  One week prior to surgery: starting today, July 18 Stop Anti-inflammatories (NSAIDS) such as Advil, Aleve, Ibuprofen, Motrin, Naproxen, Naprosyn and Aspirin based products such as Excedrin, Goody's Powder, BC Powder. Stop ANY OVER THE COUNTER supplements until after surgery. Stop multiple vitamin, melatonin. You may however, continue to take Tylenol if needed for pain up until the day of surgery.  Per Dr. Delana Meyer note - you may continue taking the 81 mg aspirin.  Continue taking all prescribed medications with the exception of the following:  Clopidogrel (Plavix) hold for 5 days before surgery. Last day to take should be July 16.  TAKE ONLY THESE MEDICATIONS THE MORNING OF SURGERY WITH A SIP OF WATER:  Carvedilol Dulera inhaler  Use inhalers on the day of surgery and bring your albuterol inhaler to the hospital.  No Alcohol for 24 hours before or after surgery.  No Smoking including e-cigarettes for 24 hours before surgery.  No chewable tobacco products for at least 6 hours before surgery.  No nicotine patches on the day of surgery.  Do not use any "recreational" drugs for at least a week (preferably 2 weeks) before your surgery.  Please be advised that the combination of cocaine and anesthesia may have negative outcomes, up to and  including death. If you test positive for cocaine, your surgery will be cancelled.  On the morning of surgery brush your teeth with toothpaste and water, you may rinse your mouth with mouthwash if you wish. Do not swallow any toothpaste or mouthwash.  Do not wear jewelry, make-up, hairpins, clips or nail polish.  Do not wear lotions, powders, or perfumes.   Do not shave body hair from the neck down 48 hours before surgery.  Contact lenses, hearing aids and dentures may not be worn into surgery.  Do not bring valuables to the hospital. [facility] is not responsible for any missing/lost belongings or valuables.   Notify your doctor if there is any change in your medical condition (cold, fever, infection).  Wear comfortable clothing (specific to your surgery type) to the hospital.  After surgery, you can help prevent lung complications by doing breathing exercises.  Take deep breaths and cough every 1-2 hours.  If you are being discharged the day of surgery, you will not be allowed to drive home. You will need a responsible individual to drive you home and stay with you for 24 hours after surgery.   If you are taking public transportation, you will need to have a responsible individual with you.  Please call the Pre-admissions Testing Dept. at [phone] if you have any questions about these instructions.  Surgery Visitation Policy:  Patients having surgery or a procedure may have two visitors.  Children under the age of 75 must have an adult with them who is not the  patient.

## 2023-07-10 ENCOUNTER — Ambulatory Visit
Admission: RE | Admit: 2023-07-10 | Discharge: 2023-07-10 | Disposition: A | Discharge status: Home / Self Care | Payer: Medicare Other | Source: Ambulatory Visit | Attending: Infectious Diseases | Admitting: Infectious Diseases

## 2023-07-10 DIAGNOSIS — N390 Urinary tract infection, site not specified: Secondary | ICD-10-CM | POA: Diagnosis not present

## 2023-07-10 DIAGNOSIS — Z1612 Extended spectrum beta lactamase (ESBL) resistance: Secondary | ICD-10-CM | POA: Diagnosis not present

## 2023-07-10 DIAGNOSIS — B9620 Unspecified Escherichia coli [E. coli] as the cause of diseases classified elsewhere: Secondary | ICD-10-CM | POA: Insufficient documentation

## 2023-07-10 MED ORDER — SODIUM CHLORIDE 0.9 % IV SOLN
1.0000 g | Freq: Once | INTRAVENOUS | Status: AC
  Administered 2023-07-10: 1000 mg via INTRAVENOUS
  Filled 2023-07-10: qty 1

## 2023-07-11 ENCOUNTER — Ambulatory Visit
Admission: RE | Admit: 2023-07-11 | Discharge: 2023-07-11 | Disposition: A | Discharge status: Home / Self Care | Payer: Medicare Other | Source: Ambulatory Visit | Attending: Infectious Diseases | Admitting: Infectious Diseases

## 2023-07-11 DIAGNOSIS — N390 Urinary tract infection, site not specified: Secondary | ICD-10-CM | POA: Diagnosis not present

## 2023-07-11 MED ORDER — SODIUM CHLORIDE 0.9 % IV SOLN
1.0000 g | Freq: Once | INTRAVENOUS | Status: AC
  Administered 2023-07-11: 1000 mg via INTRAVENOUS
  Filled 2023-07-11: qty 1

## 2023-07-12 ENCOUNTER — Ambulatory Visit
Admission: RE | Admit: 2023-07-12 | Discharge: 2023-07-12 | Disposition: A | Discharge status: Home / Self Care | Payer: Medicare Other | Source: Ambulatory Visit | Attending: Infectious Diseases | Admitting: Infectious Diseases

## 2023-07-12 DIAGNOSIS — N390 Urinary tract infection, site not specified: Secondary | ICD-10-CM | POA: Insufficient documentation

## 2023-07-12 DIAGNOSIS — A498 Other bacterial infections of unspecified site: Secondary | ICD-10-CM | POA: Diagnosis present

## 2023-07-12 MED ORDER — SODIUM CHLORIDE 0.9 % IV SOLN
1.0000 g | Freq: Once | INTRAVENOUS | Status: AC
  Administered 2023-07-12: 1000 mg via INTRAVENOUS
  Filled 2023-07-12: qty 1

## 2023-07-13 ENCOUNTER — Ambulatory Visit: Payer: Medicare Other | Admitting: Urgent Care

## 2023-07-13 ENCOUNTER — Encounter: Payer: Self-pay | Admitting: Urology

## 2023-07-13 ENCOUNTER — Other Ambulatory Visit: Payer: Self-pay

## 2023-07-13 ENCOUNTER — Encounter
Admission: RE | Disposition: A | Discharge status: Home / Self Care | Payer: Self-pay | Source: Home / Self Care | Attending: Urology

## 2023-07-13 ENCOUNTER — Ambulatory Visit
Admission: RE | Admit: 2023-07-13 | Discharge: 2023-07-13 | Disposition: A | Discharge status: Home / Self Care | Payer: Medicare Other | Source: Home / Self Care | Attending: Urology | Admitting: Urology

## 2023-07-13 ENCOUNTER — Ambulatory Visit: Payer: Medicare Other

## 2023-07-13 DIAGNOSIS — A499 Bacterial infection, unspecified: Secondary | ICD-10-CM

## 2023-07-13 DIAGNOSIS — I2510 Atherosclerotic heart disease of native coronary artery without angina pectoris: Secondary | ICD-10-CM | POA: Diagnosis not present

## 2023-07-13 DIAGNOSIS — Z87440 Personal history of urinary (tract) infections: Secondary | ICD-10-CM | POA: Diagnosis not present

## 2023-07-13 DIAGNOSIS — N202 Calculus of kidney with calculus of ureter: Secondary | ICD-10-CM | POA: Diagnosis not present

## 2023-07-13 DIAGNOSIS — I252 Old myocardial infarction: Secondary | ICD-10-CM | POA: Insufficient documentation

## 2023-07-13 DIAGNOSIS — Z8619 Personal history of other infectious and parasitic diseases: Secondary | ICD-10-CM | POA: Insufficient documentation

## 2023-07-13 DIAGNOSIS — N132 Hydronephrosis with renal and ureteral calculous obstruction: Secondary | ICD-10-CM | POA: Insufficient documentation

## 2023-07-13 DIAGNOSIS — N200 Calculus of kidney: Secondary | ICD-10-CM

## 2023-07-13 DIAGNOSIS — J4489 Other specified chronic obstructive pulmonary disease: Secondary | ICD-10-CM | POA: Diagnosis not present

## 2023-07-13 HISTORY — PX: CYSTOSCOPY W/ RETROGRADES: SHX1426

## 2023-07-13 HISTORY — PX: CYSTOSCOPY/URETEROSCOPY/HOLMIUM LASER/STENT PLACEMENT: SHX6546

## 2023-07-13 SURGERY — CYSTOSCOPY/URETEROSCOPY/HOLMIUM LASER/STENT PLACEMENT
Anesthesia: General | Site: Urethra | Laterality: Right

## 2023-07-13 MED ORDER — MIDAZOLAM HCL 2 MG/2ML IJ SOLN
INTRAMUSCULAR | Status: AC
  Filled 2023-07-13: qty 2

## 2023-07-13 MED ORDER — FENTANYL CITRATE (PF) 100 MCG/2ML IJ SOLN: 25.0000 ug | INTRAMUSCULAR | Status: DC | PRN

## 2023-07-13 MED ORDER — MIDAZOLAM HCL 2 MG/2ML IJ SOLN
INTRAMUSCULAR | Status: DC | PRN
  Administered 2023-07-13: 2 mg via INTRAVENOUS

## 2023-07-13 MED ORDER — SODIUM CHLORIDE 0.9 % IR SOLN
Status: DC | PRN
  Administered 2023-07-13: 1000 mL

## 2023-07-13 MED ORDER — CHLORHEXIDINE GLUCONATE 0.12 % MT SOLN
OROMUCOSAL | Status: AC
  Filled 2023-07-13: qty 15

## 2023-07-13 MED ORDER — SODIUM CHLORIDE 0.9 % IV SOLN
1.0000 g | Freq: Once | INTRAVENOUS | Status: AC
  Administered 2023-07-13: 1000 mg via INTRAVENOUS
  Filled 2023-07-13: qty 1

## 2023-07-13 MED ORDER — ORAL CARE MOUTH RINSE: 15.0000 mL | Freq: Once | OROMUCOSAL | Status: AC

## 2023-07-13 MED ORDER — LACTATED RINGERS IV SOLN: INTRAVENOUS | Status: DC

## 2023-07-13 MED ORDER — OXYBUTYNIN CHLORIDE 5 MG PO TABS: 5.0000 mg | ORAL_TABLET | Freq: Three times a day (TID) | ORAL | 0 refills | Status: DC | PRN

## 2023-07-13 MED ORDER — CHLORHEXIDINE GLUCONATE 0.12 % MT SOLN
15.0000 mL | Freq: Once | OROMUCOSAL | Status: AC
  Administered 2023-07-13: 15 mL via OROMUCOSAL

## 2023-07-13 MED ORDER — FAMOTIDINE 20 MG PO TABS
ORAL_TABLET | ORAL | Status: AC
  Filled 2023-07-13: qty 1

## 2023-07-13 MED ORDER — SUGAMMADEX SODIUM 200 MG/2ML IV SOLN
INTRAVENOUS | Status: DC | PRN
  Administered 2023-07-13: 299.2 mg via INTRAVENOUS

## 2023-07-13 MED ORDER — OXYCODONE HCL 5 MG PO TABS: 5.0000 mg | ORAL_TABLET | Freq: Once | ORAL | Status: DC | PRN

## 2023-07-13 MED ORDER — TRAMADOL HCL 50 MG PO TABS: 50.0000 mg | ORAL_TABLET | Freq: Four times a day (QID) | ORAL | 0 refills | Status: DC | PRN

## 2023-07-13 MED ORDER — ROCURONIUM BROMIDE 100 MG/10ML IV SOLN
INTRAVENOUS | Status: DC | PRN
  Administered 2023-07-13: 50 mg via INTRAVENOUS

## 2023-07-13 MED ORDER — FENTANYL CITRATE (PF) 100 MCG/2ML IJ SOLN
INTRAMUSCULAR | Status: DC | PRN
  Administered 2023-07-13 (×2): 50 ug via INTRAVENOUS

## 2023-07-13 MED ORDER — OXYCODONE HCL 5 MG/5ML PO SOLN: 5.0000 mg | Freq: Once | ORAL | Status: DC | PRN

## 2023-07-13 MED ORDER — FAMOTIDINE 20 MG PO TABS
20.0000 mg | ORAL_TABLET | Freq: Once | ORAL | Status: AC
  Administered 2023-07-13: 20 mg via ORAL

## 2023-07-13 MED ORDER — LIDOCAINE HCL (CARDIAC) PF 100 MG/5ML IV SOSY
PREFILLED_SYRINGE | INTRAVENOUS | Status: DC | PRN
  Administered 2023-07-13: 80 mg via INTRAVENOUS

## 2023-07-13 MED ORDER — PROPOFOL 10 MG/ML IV BOLUS
INTRAVENOUS | Status: DC | PRN
Start: 2023-07-13 — End: 2023-07-13
  Administered 2023-07-13: 150 mg via INTRAVENOUS
  Administered 2023-07-13: 20 ug/kg/min via INTRAVENOUS

## 2023-07-13 MED ORDER — PROPOFOL 10 MG/ML IV BOLUS
INTRAVENOUS | Status: AC
  Filled 2023-07-13: qty 20

## 2023-07-13 MED ORDER — FENTANYL CITRATE (PF) 100 MCG/2ML IJ SOLN
INTRAMUSCULAR | Status: AC
  Filled 2023-07-13: qty 2

## 2023-07-13 MED ORDER — DEXAMETHASONE SODIUM PHOSPHATE 10 MG/ML IJ SOLN
INTRAMUSCULAR | Status: DC | PRN
  Administered 2023-07-13: 10 mg via INTRAVENOUS

## 2023-07-13 MED ORDER — STERILE WATER FOR IRRIGATION IR SOLN
Status: DC | PRN
  Administered 2023-07-13: 500 mL

## 2023-07-13 MED ORDER — TAMSULOSIN HCL 0.4 MG PO CAPS: 0.4000 mg | ORAL_CAPSULE | Freq: Every day | ORAL | 0 refills | Status: DC

## 2023-07-13 MED ORDER — ONDANSETRON HCL 4 MG/2ML IJ SOLN
INTRAMUSCULAR | Status: DC | PRN
  Administered 2023-07-13: 4 mg via INTRAVENOUS

## 2023-07-13 MED ORDER — MIDAZOLAM HCL 2 MG/2ML IJ SOLN
1.0000 mg | INTRAMUSCULAR | Status: DC | PRN
  Administered 2023-07-13: 1 mg via INTRAVENOUS

## 2023-07-13 MED ORDER — IOHEXOL 180 MG/ML  SOLN
INTRAMUSCULAR | Status: DC | PRN
  Administered 2023-07-13: 5 mL

## 2023-07-13 SURGICAL SUPPLY — 29 items
ADH LQ OCL WTPRF AMP STRL LF (MISCELLANEOUS)
ADHESIVE MASTISOL STRL (MISCELLANEOUS) IMPLANT
BAG DRAIN SIEMENS DORNER NS (MISCELLANEOUS) ×2 IMPLANT
BAG DRN NS LF (MISCELLANEOUS) ×2
BASKET ZERO TIP 1.9FR (BASKET) IMPLANT
BRUSH SCRUB EZ 1% IODOPHOR (MISCELLANEOUS) ×2 IMPLANT
BSKT STON RTRVL ZERO TP 1.9FR (BASKET)
CATH URET FLEX-TIP 2 LUMEN 10F (CATHETERS) IMPLANT
CATH URETL OPEN 5X70 (CATHETERS) ×2 IMPLANT
CNTNR URN SCR LID CUP LEK RST (MISCELLANEOUS) IMPLANT
CONT SPEC 4OZ STRL OR WHT (MISCELLANEOUS)
DRAPE UTILITY 15X26 TOWEL STRL (DRAPES) ×2 IMPLANT
DRSG TEGADERM 2-3/8X2-3/4 SM (GAUZE/BANDAGES/DRESSINGS) IMPLANT
FIBER LASER MOSES 200 DFL (Laser) IMPLANT
GLOVE BIO SURGEON STRL SZ 6.5 (GLOVE) ×2 IMPLANT
GOWN STRL REUS W/ TWL LRG LVL3 (GOWN DISPOSABLE) ×4 IMPLANT
GOWN STRL REUS W/TWL LRG LVL3 (GOWN DISPOSABLE) ×4
GUIDEWIRE GREEN .038 145CM (MISCELLANEOUS) IMPLANT
GUIDEWIRE STR DUAL SENSOR (WIRE) ×2 IMPLANT
IV NS IRRIG 3000ML ARTHROMATIC (IV SOLUTION) ×2 IMPLANT
KIT TURNOVER CYSTO (KITS) ×2 IMPLANT
PACK CYSTO AR (MISCELLANEOUS) ×2 IMPLANT
SET CYSTO W/LG BORE CLAMP LF (SET/KITS/TRAYS/PACK) ×2 IMPLANT
SHEATH NAVIGATOR HD 12/14X36 (SHEATH) IMPLANT
STENT URET 6FRX24 CONTOUR (STENTS) IMPLANT
STENT URET 6FRX26 CONTOUR (STENTS) IMPLANT
SURGILUBE 2OZ TUBE FLIPTOP (MISCELLANEOUS) ×2 IMPLANT
TUBING CONNECTING 10 (TUBING) IMPLANT
WATER STERILE IRR 500ML POUR (IV SOLUTION) ×2 IMPLANT

## 2023-07-13 NOTE — H&P (View-Only) (Signed)
07/13/23  RRR CTAB  She has been getting IV abx per ID x 4 days prior to procedure  [facility] [birth year] [id number]   Referring provider: No referring provider defined for this encounter.      Chief Complaint  Patient presents with   New Patient (Initial Visit)      HPI: 72 year-old female who presents today with her daughter and granddaughter for further evaluation of recurrent UTI's and a kidney stone.    She was recently admitted twice, both times with a urinary tract infection and possible pyelonephritis. On her most recent admission, she had a CT scan on 01/03/2023 that showed a 12x6x11 mm right renal pelvic stone along with mild right hydronephrosis and inflammation while enhancement of the proximal ureter and renal pelvis with surrounding inflammation. There is also scarring throughout the right renal cortex. There is also evidence of possible left sided pyelonephritis.  She was seen and evaluated by Urology during her first admission for complicated ESBL bacteremia and UTI at which time she was not interested in treatment. At the time, she reported that she has known about this stone and preferred to manage it conservatively.    The stone was first identified in December 2019 at [facility] at which time she was septic and underwent emergent ureteral stenting.     Due to her high-risk status, including Smoker's Heart Disease and COPD, both [facility] and [facility] have been hesitant to remove the stone surgically. Documentation however suggests that she was stented when septic in 2019 and elected to have stent removed in 2020 with uncertainty from the COVID-19 pandemic.     She has a history of poor healing and major blockages in her heart, which are being managed medically. She is currently on aspirin and Plavix for circulation issues in her legs.            Results for orders placed or performed in visit on 05/22/23  BLADDER SCAN AMB NON-IMAGING  Result Value Ref Range    Scan  Result 0 ml    Results for orders placed or performed during the hospital encounter of 05/22/23  Urinalysis, Complete w Microscopic -  Result Value Ref Range    Color, Urine YELLOW YELLOW    APPearance TURBID (A) CLEAR    Specific Gravity, Urine 1.025 1.005 - 1.030    pH 5.5 5.0 - 8.0    Glucose, UA NEGATIVE NEGATIVE mg/dL    Hgb urine dipstick MODERATE (A) NEGATIVE    Bilirubin Urine NEGATIVE NEGATIVE    Ketones, ur NEGATIVE NEGATIVE mg/dL    Protein, ur 865 (A) NEGATIVE mg/dL    Nitrite POSITIVE (A) NEGATIVE    Leukocytes,Ua SMALL (A) NEGATIVE    Squamous Epithelial / HPF 0-5 0 - 5 /HPF    WBC, UA >50 0 - 5 WBC/hpf    RBC / HPF >50 0 - 5 RBC/hpf    Bacteria, UA MANY (A) NONE SEEN            PMH:     Past Medical History:  Diagnosis Date   Asthma     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   CAD (coronary artery disease)     COPD (chronic obstructive pulmonary disease) (HCC)     Frequent UTI     Kidney stones            Surgical History:      Past Surgical History:  Procedure Laterality  Date   ANKLE FUSION Right 2017   fibular graft  Bilateral      bilater fibular missing and replaced in hips   HIP FRACTURE SURGERY Bilateral     LOWER EXTREMITY ANGIOGRAPHY Right 01/09/2023    Procedure: Lower Extremity Angiography;  Surgeon: Renford Dills, MD;  Location: [facility] INVASIVE CV LAB;  Service: Cardiovascular;  Laterality: Right;   MASTECTOMY, PARTIAL Left 2003   ROTATOR CUFF REPAIR Left      X 2   TONSILLECTOMY              Home Medications:  Allergies as of 05/22/2023         Reactions    Penicillins Rash    Oxycodone      Vicodin [hydrocodone-acetaminophen]              Medication List           Accurate as of May 22, 2023  3:45 PM. If you have any questions, ask your nurse or doctor.              STOP taking these medications     benzonatate 100 MG capsule Commonly known as: TESSALON Stopped by: [name], MD     loperamide 2 MG capsule Commonly known as: IMODIUM Stopped by: [name], MD    QUEtiapine 25 MG tablet Commonly known as: SEROQUEL Stopped by: [name], MD           TAKE these medications     acetaminophen 500 MG tablet Commonly known as: TYLENOL Take 1,000 mg by mouth at bedtime.    albuterol 108 (90 Base) MCG/ACT inhaler Commonly known as: VENTOLIN HFA Inhale 2 puffs into the lungs every 6 (six) hours as needed for wheezing or shortness of breath.    amLODipine 10 MG tablet Commonly known as: NORVASC Take 10 mg by mouth at bedtime.    aspirin EC 81 MG tablet Take 81 mg by mouth daily. Swallow whole.    carvedilol 12.5 MG tablet Commonly known as: COREG Take 12.5 mg by mouth 2 (two) times daily with a meal.    cetirizine 10 MG chewable tablet Commonly known as: ZYRTEC Chew 10 mg by mouth daily.    clopidogrel 75 MG tablet Commonly known as: PLAVIX Take 1 tablet (75 mg total) by mouth daily with breakfast.    guaiFENesin 600 MG 12 hr tablet Commonly known as: MUCINEX Take 2 tablets (1,200 mg total) by mouth 2 (two) times daily as needed for to loosen phlegm.    Melatonin 10 MG Tabs Take by mouth at bedtime.    mometasone-formoterol 200-5 MCG/ACT Aero Commonly known as: DULERA Inhale 2 puffs into the lungs 2 (two) times daily.    multivitamin with minerals Tabs tablet Take 1 tablet by mouth daily.             Allergies:  Allergies      Allergies  Allergen Reactions   Penicillins Rash   Oxycodone     Vicodin [Hydrocodone-Acetaminophen]          Family History: Family History  Adopted: Yes          Social History:  reports that she has quit smoking. Her smoking use included cigarettes. She smoked an average of 1 pack per day. She has never used smokeless tobacco. She reports that she does not drink alcohol and does not use drugs.     Physical Exam: BP 125/77   Ht 5\' 3"  (1.6  m)   Wt 150 lb 12.8 oz (68.4 kg)   BMI 26.71  kg/m   Constitutional:  Alert and oriented, No acute distress.  Accompanied by daughter and granddaughter.   HEENT: [place] AT, moist mucus membranes.  Trachea midline, no masses. Neurologic: Grossly intact, no focal deficits, moving all 4 extremities. Psychiatric: Normal mood and affect.   Pertinent Imaging:   US RENAL   Narrative CLINICAL DATA:  99105 Renal stone [id number]   EXAM: RENAL / URINARY TRACT ULTRASOUND COMPLETE   COMPARISON:  None Available.   FINDINGS: Right Kidney:   Renal measurements: 11.0 x 5.5 x 5.6 cm = volume: 176 mL. Echogenicity within normal limits. There is an echogenic focus with posterior acoustic shadowing measuring approximately 2.2 cm within the renal pelvis most consistent with a nephrolithiasis. There is mild adjacent hydronephrosis.   Left Kidney:   Renal measurements: 12.8 x 6.1 x 5.3 cm = volume: Changes 15 mL. Echogenicity within normal limits. No mass or definitive hydronephrosis visualized.   Bladder:   Completely decompressed.   Other:   None.   IMPRESSION: There is a 2.2 cm nephrolithiasis within the right renal pelvis with mild upstream hydronephrosis.     Electronically Signed By: Meda Klinefelter M.D. On: 04/25/2023 16:30   This has been personally reviewed and I agree with the radiologic interpretation.   EXAM: CT ABDOMEN AND PELVIS WITH CONTRAST   TECHNIQUE: Multidetector CT imaging of the abdomen and pelvis was performed using the standard protocol following bolus administration of intravenous contrast.   RADIATION DOSE REDUCTION: This exam was performed according to the departmental dose-optimization program which includes automated exposure control, adjustment of the mA and/or kV according to patient size and/or use of iterative reconstruction technique.   CONTRAST:  75mL OMNIPAQUE IOHEXOL 350 MG/ML SOLN   COMPARISON:  Renal ultrasound 04/25/2023.  CT stone 12/18/2018.   FINDINGS: Lower chest: There is a  trace right pleural effusion with right basilar atelectasis.   Hepatobiliary: No focal liver abnormality is seen. No gallstones, gallbladder wall thickening, or biliary dilatation.   Pancreas: Unremarkable. No pancreatic ductal dilatation or surrounding inflammatory changes.   Spleen: Normal in size without focal abnormality.   Adrenals/Urinary Tract: There are patchy ill-defined areas of hypodensity within the left kidney suspicious for pyelonephritis. There is no hydronephrosis. There are renal cortical cysts in the left kidney measuring up 2 10 mm.   There is a 12 by 6 by 11 mm calculus in the right renal pelvis. There is mild right-sided hydronephrosis. There is some wall enhancement of the right renal pelvis, collecting system and proximal ureter with mild surrounding inflammation. There is scarring throughout the right renal cortex. There are additional nonobstructing calculi in the right kidney. The right kidney enhances normally.   The adrenal glands and bladder are within normal limits.   Stomach/Bowel: Stomach is within normal limits. Appendix appears normal. No evidence of bowel wall thickening, distention, or inflammatory changes. There is sigmoid colon diverticulosis.   Vascular/Lymphatic: Aortic atherosclerosis. No enlarged abdominal or pelvic lymph nodes.   Reproductive: Uterus and bilateral adnexa are unremarkable.   Other: There is a small fat containing umbilical hernia. No abdominopelvic ascites.   Musculoskeletal: Degenerative changes affect the spine. There are tracts from old bilateral hip screws present.   IMPRESSION: 1. Findings compatible with left-sided pyelonephritis. 2. There is a 12 x 6 x 11 mm calculus in the right renal pelvis with mild right-sided hydronephrosis. There is wall enhancement of the right renal  pelvis, collecting system and proximal ureter with mild surrounding inflammation concerning for pyelitis. 3. Additional  nonobstructing right renal calculi. 4. Trace right pleural effusion with right basilar atelectasis.   Aortic Atherosclerosis (ICD10-I70.0).     Electronically Signed   By: Darliss Cheney M.D.   On: 05/04/2023 20:50   This has been personally reviewed and I agree with the radiologic interpretation.  ~ 1500 HFU.   Assessment & Plan:     1. Recurrent UTI's and Kidney stone Strongly recommend treatment for this stone in light of obstruction and recurrent infection.   We discussed various treatment options for urolithiasis including observation with or without medical expulsive therapy, shockwave lithotripsy (SWL), ureteroscopy and laser lithotripsy with stent placement, and percutaneous nephrolithotomy.   We discussed that management is based on stone size, location, density, patient co-morbidities, and patient preference.    Stones <64mm in size have a >80% spontaneous passage rate. Data surrounding the use of tamsulosin for medical expulsive therapy is controversial, but meta analyses suggests it is most efficacious for distal stones between 5-17mm in size. Possible side effects include dizziness/lightheadedness, and retrograde ejaculation.   SWL has a lower stone free rate in a single procedure, but also a lower complication rate compared to ureteroscopy and avoids a stent and associated stent related symptoms. Possible complications include renal hematoma, steinstrasse, and need for additional treatment. We discussed the role of his increased skin to stone distance can lead to decreased efficacy with shockwave lithotripsy.   Ureteroscopy with laser lithotripsy and stent placement has a higher stone free rate than SWL in a single procedure, however increased complication rate including possible infection, ureteral injury, bleeding, and stent related morbidity. Common stent related symptoms include dysuria, urgency/frequency, and flank pain.   After an extensive discussion of the risks and  benefits of the above treatment options, the patient would like to proceed with ureteroscopy with laser lithotripsy.    - Most interested in ureteroscopy with laser lithotripsy for stone removal.  Understands may need staged procedure.   - Obtain cardiac and vascular clearance for holding Plavix (ok to continue ASA)    - Pre-op urine culture to ensure no active infection- high risk for sepsis   - If infection present, treat with appropriate antibiotics before the procedure.   Return for ureteroscopy with laser lithotripsy once cardiac and vascular clearance is obtained.   I have reviewed the above documentation for accuracy and completeness, and I agree with the above.    [name], MD   [facility] [address] [place], [place] [address] [phone]

## 2023-07-13 NOTE — Anesthesia Procedure Notes (Signed)
Procedure Name: Intubation Date/Time: 07/13/2023 1:15 PM  Performed by: Elisabeth Pigeon, CRNAPre-anesthesia Checklist: Patient identified, Emergency Drugs available, Suction available and Patient being monitored Patient Re-evaluated:Patient Re-evaluated prior to induction Oxygen Delivery Method: Circle system utilized Preoxygenation: Pre-oxygenation with 100% oxygen Induction Type: IV induction Ventilation: Mask ventilation without difficulty Laryngoscope Size: McGraph and 3 Grade View: Grade I Tube type: Oral Tube size: 7.0 mm Number of attempts: 1 Airway Equipment and Method: Stylet and Oral airway Placement Confirmation: ETT inserted through vocal cords under direct vision, positive ETCO2 and breath sounds checked- equal and bilateral Secured at: 21 cm Tube secured with: Tape Dental Injury: Teeth and Oropharynx as per pre-operative assessment

## 2023-07-13 NOTE — Discharge Instructions (Signed)
You have a ureteral stent in place.  This is a tube that extends from your kidney to your bladder.  This may cause urinary bleeding, burning with urination, and urinary frequency.  Please call our office or present to the ED if you develop fevers >101 or pain which is not able to be controlled with oral pain medications.  You may be given either Flomax and/ or ditropan to help with bladder spasms and stent pain in addition to pain medications.    [facility] [address], [address] [place], [place] [address] [phone] 

## 2023-07-13 NOTE — H&P (Signed)
07/13/23  RRR CTAB  She has been getting IV abx per ID x 4 days prior to procedure  [facility] [birth year] [phone]   Referring provider: No referring provider defined for this encounter.      Chief Complaint  Patient presents with   New Patient (Initial Visit)      HPI: 72 year-old female who presents today with her daughter and granddaughter for further evaluation of recurrent UTI's and a kidney stone.    She was recently admitted twice, both times with a urinary tract infection and possible pyelonephritis. On her most recent admission, she had a CT scan on 01/03/2023 that showed a 12x6x11 mm right renal pelvic stone along with mild right hydronephrosis and inflammation while enhancement of the proximal ureter and renal pelvis with surrounding inflammation. There is also scarring throughout the right renal cortex. There is also evidence of possible left sided pyelonephritis.  She was seen and evaluated by Urology during her first admission for complicated ESBL bacteremia and UTI at which time she was not interested in treatment. At the time, she reported that she has known about this stone and preferred to manage it conservatively.    The stone was first identified in December 2019 at [facility] at which time she was septic and underwent emergent ureteral stenting.     Due to her high-risk status, including Smoker's Heart Disease and COPD, both [facility] and [facility] have been hesitant to remove the stone surgically. Documentation however suggests that she was stented when septic in 2019 and elected to have stent removed in 2020 with uncertainty from the COVID-19 pandemic.     She has a history of poor healing and major blockages in her heart, which are being managed medically. She is currently on aspirin and Plavix for circulation issues in her legs.            Results for orders placed or performed in visit on 05/22/23  BLADDER SCAN AMB NON-IMAGING  Result Value Ref Range    Scan  Result 0 ml    Results for orders placed or performed during the hospital encounter of 05/22/23  Urinalysis, Complete w Microscopic -  Result Value Ref Range    Color, Urine YELLOW YELLOW    APPearance TURBID (A) CLEAR    Specific Gravity, Urine 1.025 1.005 - 1.030    pH 5.5 5.0 - 8.0    Glucose, UA NEGATIVE NEGATIVE mg/dL    Hgb urine dipstick MODERATE (A) NEGATIVE    Bilirubin Urine NEGATIVE NEGATIVE    Ketones, ur NEGATIVE NEGATIVE mg/dL    Protein, ur 865 (A) NEGATIVE mg/dL    Nitrite POSITIVE (A) NEGATIVE    Leukocytes,Ua SMALL (A) NEGATIVE    Squamous Epithelial / HPF 0-5 0 - 5 /HPF    WBC, UA >50 0 - 5 WBC/hpf    RBC / HPF >50 0 - 5 RBC/hpf    Bacteria, UA MANY (A) NONE SEEN            PMH:     Past Medical History:  Diagnosis Date   Asthma     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   CAD (coronary artery disease)     COPD (chronic obstructive pulmonary disease) (HCC)     Frequent UTI     Kidney stones            Surgical History:      Past Surgical History:  Procedure Laterality  Date   ANKLE FUSION Right 2017   fibular graft  Bilateral      bilater fibular missing and replaced in hips   HIP FRACTURE SURGERY Bilateral     LOWER EXTREMITY ANGIOGRAPHY Right 01/09/2023    Procedure: Lower Extremity Angiography;  Surgeon: Renford Dills, MD;  Location: [facility] INVASIVE CV LAB;  Service: Cardiovascular;  Laterality: Right;   MASTECTOMY, PARTIAL Left 2003   ROTATOR CUFF REPAIR Left      X 2   TONSILLECTOMY              Home Medications:  Allergies as of 05/22/2023         Reactions    Penicillins Rash    Oxycodone      Vicodin [hydrocodone-acetaminophen]              Medication List           Accurate as of May 22, 2023  3:45 PM. If you have any questions, ask your nurse or doctor.              STOP taking these medications     benzonatate 100 MG capsule Commonly known as: TESSALON Stopped by: [name], MD     loperamide 2 MG capsule Commonly known as: IMODIUM Stopped by: [name], MD    QUEtiapine 25 MG tablet Commonly known as: SEROQUEL Stopped by: [name], MD           TAKE these medications     acetaminophen 500 MG tablet Commonly known as: TYLENOL Take 1,000 mg by mouth at bedtime.    albuterol 108 (90 Base) MCG/ACT inhaler Commonly known as: VENTOLIN HFA Inhale 2 puffs into the lungs every 6 (six) hours as needed for wheezing or shortness of breath.    amLODipine 10 MG tablet Commonly known as: NORVASC Take 10 mg by mouth at bedtime.    aspirin EC 81 MG tablet Take 81 mg by mouth daily. Swallow whole.    carvedilol 12.5 MG tablet Commonly known as: COREG Take 12.5 mg by mouth 2 (two) times daily with a meal.    cetirizine 10 MG chewable tablet Commonly known as: ZYRTEC Chew 10 mg by mouth daily.    clopidogrel 75 MG tablet Commonly known as: PLAVIX Take 1 tablet (75 mg total) by mouth daily with breakfast.    guaiFENesin 600 MG 12 hr tablet Commonly known as: MUCINEX Take 2 tablets (1,200 mg total) by mouth 2 (two) times daily as needed for to loosen phlegm.    Melatonin 10 MG Tabs Take by mouth at bedtime.    mometasone-formoterol 200-5 MCG/ACT Aero Commonly known as: DULERA Inhale 2 puffs into the lungs 2 (two) times daily.    multivitamin with minerals Tabs tablet Take 1 tablet by mouth daily.             Allergies:  Allergies      Allergies  Allergen Reactions   Penicillins Rash   Oxycodone     Vicodin [Hydrocodone-Acetaminophen]          Family History: Family History  Adopted: Yes          Social History:  reports that she has quit smoking. Her smoking use included cigarettes. She smoked an average of 1 pack per day. She has never used smokeless tobacco. She reports that she does not drink alcohol and does not use drugs.     Physical Exam: BP 125/77   Ht 5\' 3"  (1.6  m)   Wt 150 lb 12.8 oz (68.4 kg)   BMI 26.71  kg/m   Constitutional:  Alert and oriented, No acute distress.  Accompanied by daughter and granddaughter.   HEENT: [place] AT, moist mucus membranes.  Trachea midline, no masses. Neurologic: Grossly intact, no focal deficits, moving all 4 extremities. Psychiatric: Normal mood and affect.   Pertinent Imaging:   US RENAL   Narrative CLINICAL DATA:  99105 Renal stone [id number]   EXAM: RENAL / URINARY TRACT ULTRASOUND COMPLETE   COMPARISON:  None Available.   FINDINGS: Right Kidney:   Renal measurements: 11.0 x 5.5 x 5.6 cm = volume: 176 mL. Echogenicity within normal limits. There is an echogenic focus with posterior acoustic shadowing measuring approximately 2.2 cm within the renal pelvis most consistent with a nephrolithiasis. There is mild adjacent hydronephrosis.   Left Kidney:   Renal measurements: 12.8 x 6.1 x 5.3 cm = volume: Changes 15 mL. Echogenicity within normal limits. No mass or definitive hydronephrosis visualized.   Bladder:   Completely decompressed.   Other:   None.   IMPRESSION: There is a 2.2 cm nephrolithiasis within the right renal pelvis with mild upstream hydronephrosis.     Electronically Signed By: Meda Klinefelter M.D. On: 04/25/2023 16:30   This has been personally reviewed and I agree with the radiologic interpretation.   EXAM: CT ABDOMEN AND PELVIS WITH CONTRAST   TECHNIQUE: Multidetector CT imaging of the abdomen and pelvis was performed using the standard protocol following bolus administration of intravenous contrast.   RADIATION DOSE REDUCTION: This exam was performed according to the departmental dose-optimization program which includes automated exposure control, adjustment of the mA and/or kV according to patient size and/or use of iterative reconstruction technique.   CONTRAST:  75mL OMNIPAQUE IOHEXOL 350 MG/ML SOLN   COMPARISON:  Renal ultrasound 04/25/2023.  CT stone 12/18/2018.   FINDINGS: Lower chest: There is a  trace right pleural effusion with right basilar atelectasis.   Hepatobiliary: No focal liver abnormality is seen. No gallstones, gallbladder wall thickening, or biliary dilatation.   Pancreas: Unremarkable. No pancreatic ductal dilatation or surrounding inflammatory changes.   Spleen: Normal in size without focal abnormality.   Adrenals/Urinary Tract: There are patchy ill-defined areas of hypodensity within the left kidney suspicious for pyelonephritis. There is no hydronephrosis. There are renal cortical cysts in the left kidney measuring up 2 10 mm.   There is a 12 by 6 by 11 mm calculus in the right renal pelvis. There is mild right-sided hydronephrosis. There is some wall enhancement of the right renal pelvis, collecting system and proximal ureter with mild surrounding inflammation. There is scarring throughout the right renal cortex. There are additional nonobstructing calculi in the right kidney. The right kidney enhances normally.   The adrenal glands and bladder are within normal limits.   Stomach/Bowel: Stomach is within normal limits. Appendix appears normal. No evidence of bowel wall thickening, distention, or inflammatory changes. There is sigmoid colon diverticulosis.   Vascular/Lymphatic: Aortic atherosclerosis. No enlarged abdominal or pelvic lymph nodes.   Reproductive: Uterus and bilateral adnexa are unremarkable.   Other: There is a small fat containing umbilical hernia. No abdominopelvic ascites.   Musculoskeletal: Degenerative changes affect the spine. There are tracts from old bilateral hip screws present.   IMPRESSION: 1. Findings compatible with left-sided pyelonephritis. 2. There is a 12 x 6 x 11 mm calculus in the right renal pelvis with mild right-sided hydronephrosis. There is wall enhancement of the right renal  pelvis, collecting system and proximal ureter with mild surrounding inflammation concerning for pyelitis. 3. Additional  nonobstructing right renal calculi. 4. Trace right pleural effusion with right basilar atelectasis.   Aortic Atherosclerosis (ICD10-I70.0).     Electronically Signed   By: Darliss Cheney M.D.   On: 05/04/2023 20:50   This has been personally reviewed and I agree with the radiologic interpretation.  ~ 1500 HFU.   Assessment & Plan:     1. Recurrent UTI's and Kidney stone Strongly recommend treatment for this stone in light of obstruction and recurrent infection.   We discussed various treatment options for urolithiasis including observation with or without medical expulsive therapy, shockwave lithotripsy (SWL), ureteroscopy and laser lithotripsy with stent placement, and percutaneous nephrolithotomy.   We discussed that management is based on stone size, location, density, patient co-morbidities, and patient preference.    Stones <64mm in size have a >80% spontaneous passage rate. Data surrounding the use of tamsulosin for medical expulsive therapy is controversial, but meta analyses suggests it is most efficacious for distal stones between 5-17mm in size. Possible side effects include dizziness/lightheadedness, and retrograde ejaculation.   SWL has a lower stone free rate in a single procedure, but also a lower complication rate compared to ureteroscopy and avoids a stent and associated stent related symptoms. Possible complications include renal hematoma, steinstrasse, and need for additional treatment. We discussed the role of his increased skin to stone distance can lead to decreased efficacy with shockwave lithotripsy.   Ureteroscopy with laser lithotripsy and stent placement has a higher stone free rate than SWL in a single procedure, however increased complication rate including possible infection, ureteral injury, bleeding, and stent related morbidity. Common stent related symptoms include dysuria, urgency/frequency, and flank pain.   After an extensive discussion of the risks and  benefits of the above treatment options, the patient would like to proceed with ureteroscopy with laser lithotripsy.    - Most interested in ureteroscopy with laser lithotripsy for stone removal.  Understands may need staged procedure.   - Obtain cardiac and vascular clearance for holding Plavix (ok to continue ASA)    - Pre-op urine culture to ensure no active infection- high risk for sepsis   - If infection present, treat with appropriate antibiotics before the procedure.   Return for ureteroscopy with laser lithotripsy once cardiac and vascular clearance is obtained.   I have reviewed the above documentation for accuracy and completeness, and I agree with the above.    [name], MD   [facility] [address] [place], [place] [address] [phone]

## 2023-07-13 NOTE — Transfer of Care (Signed)
Immediate Anesthesia Transfer of Care Note  Patient: [name]  Procedure(s) Performed: CYSTOSCOPY/URETEROSCOPY/STENT PLACEMENT (Right: Urethra) CYSTOSCOPY WITH RETROGRADE PYELOGRAM  Patient Location: PACU  Anesthesia Type:General  Level of Consciousness: awake  Airway & Oxygen Therapy: Patient Spontanous Breathing and Patient connected to face mask oxygen  Post-op Assessment: Report given to RN and Post -op Vital signs reviewed and stable  Post vital signs: Reviewed and stable  Last Vitals:  Vitals Value Taken Time  BP 134/84 07/13/23 1416  Temp 36.6 C 07/13/23 1416  Pulse 68 07/13/23 1418  Resp 24 07/13/23 1418  SpO2 98 % 07/13/23 1418  Vitals shown include unfiled device data.  Last Pain:  Vitals:   07/13/23 1008  TempSrc: Temporal         Complications: No notable events documented.

## 2023-07-13 NOTE — Op Note (Signed)
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  Urine persistently cloudy.  Able to access stone however laser malfunction.  Opted for staged procedure.  EBL: Minimal  Specimens: Urine culture  Drains: 6 x 20 French double-J ureteral stent on right  Indication: [name] is a 72 y.o. patient with chronic 12 mm right UPJ stone along with chronic bacterial colonization with ESBL organism.  She has been receiving IV antibiotics since Friday.  After reviewing the management options for treatment, she elected to proceed with the above surgical procedure(s). We have discussed the potential benefits and risks of the procedure, side effects of the proposed treatment, the likelihood of the patient achieving the goals of the procedure, and any potential problems that might occur during the procedure or recuperation. Informed consent has been obtained.  Description of procedure:  The patient was taken to the operating room and general anesthesia was induced.  The patient was placed in the dorsal lithotomy position, prepped and draped in the usual sterile fashion, and preoperative antibiotics were administered. A preoperative time-out was performed.   A 21 French cystoscope was advanced per urethra into the bladder.  Notably, there is descent of the trigone consistent with pelvic floor laxity.  The urine was diffusely cloudy with patchy erythema, consistent with cystitis.  Another culture was sent.  On scout imaging, the 12 mm stone could be seen at the UPJ along with a lower pole stone.  I had some difficulty accessing the right  UO initially and ultimately was successful using a 5 [place] open-ended ureteral catheter and a wire.  Retrograde pyelogram showed no ureteral abnormality.  The sensor wires placed all the way up to the level of the upper pole and a dual-lumen access sheath was used to introduce a second Super Stiff wire as a working wire.  The safety wire was snapped in place.  A 36 cm ureteral access sheath was advanced to the proximal ureter which actually went quite easily.  A digital ureteroscope was then advanced to the level of the stone and a 200 m laser fiber was then introduced and the scope.  Notably, there was some adherent debris and blood clots on the stone itself.  Unfortunately, at this point in time our laser malfunctioned.  We tried to shop troubleshoot this as best as possible and called in for assistance.  Unfortunately, we were never able to get the laser to turn on.  That being said, in light of her cloudy urine, history of chronic ESBL colonization and debris on the stone, clinically, it also seemed appropriate to go ahead and just stent the patient return for staged procedure after urinary decompression.  The access sheath and scope were removed.  A 6 x 24 French double-J ureteral stent was then placed over the safety wire.  A full coil was noted within the renal pelvis as well as within the bladder.  The bladder was then drained.  The patient was then cleaned and dried, repositioned in supine position, reversed of anesthesia, and taken to the PACU in stable condition.  Plan: Will reschedule her for staged procedure in a few days.  Will reach out to Dr. Joylene Draft from infectious disease for plan for  her antibiotics in the interim.  Follow-up urine cultures.  I did discuss intraoperative details with her daughter at length today and explained both are technical and clinical concerns today necessitating of staged procedure.  She understands.  [name], M.D.

## 2023-07-13 NOTE — Anesthesia Preprocedure Evaluation (Signed)
Anesthesia Evaluation  Patient identified by MRN, date of birth, ID band Patient awake    Reviewed: Allergy & Precautions, NPO status , Patient's Chart, lab work & pertinent test results  History of Anesthesia Complications (+) PONV and history of anesthetic complications  Airway Mallampati: III  TM Distance: >3 FB Neck ROM: full    Dental  (+) Chipped, Dental Advidsory Given   Pulmonary neg shortness of breath, COPD,  COPD inhaler, former smoker   Pulmonary exam normal        Cardiovascular hypertension, (-) angina + CAD and + Past MI  Normal cardiovascular exam     Neuro/Psych  PSYCHIATRIC DISORDERS Anxiety     negative neurological ROS     GI/Hepatic negative GI ROS, Neg liver ROS,,,  Endo/Other  negative endocrine ROS    Renal/GU Renal disease     Musculoskeletal   Abdominal   Peds  Hematology negative hematology ROS (+)   Anesthesia Other Findings Past Medical History: No date: Acute encephalopathy No date: Anemia No date: Asthma No date: AVN (avascular necrosis of bone) (HCC)     Comment:  bilateral hips (femoral heads) 2003: Breast cancer (HCC)     Comment:  left No date: Brown's sheath syndrome of right eye No date: CAD (coronary artery disease) No date: Carpal tunnel syndrome No date: Cataract cortical, senile, bilateral No date: Cellulitis of great toe of right foot No date: Chronic bronchitis (HCC) No date: Complication of anesthesia     Comment:  awareness under anesthesia No date: COPD (chronic obstructive pulmonary disease) (HCC) No date: Edema 04/25/2023: ESBL (extended spectrum beta-lactamase) producing  bacteria infection     Comment:  blood culture No date: Essential hypertension No date: Foot ulcer (HCC)     Comment:  right foot-being seen at wound clinic No date: Frequent UTI No date: Hallux valgus with bunions No date: Hammertoe of left foot No date: Hyperlipidemia No date:  Kidney stones No date: Lung nodule 2019: NSTEMI (non-ST elevated myocardial infarction) (HCC) No date: Osteoarthritis of ankle and foot, right No date: Osteopenia No date: Peripheral neuropathy No date: PONV (postoperative nausea and vomiting) No date: Positive PPD, treated No date: Pre-diabetes No date: PVC's (premature ventricular contractions) No date: PVD (peripheral vascular disease) (HCC) 11/2018: Sepsis secondary to UTI (HCC) No date: Tobacco abuse  Past Surgical History: 12/27/2015: ANKLE FUSION; Right     Comment:  ttc WITH FEMORAL HEAD ALLOGRAFT 2002: BONE EXOSTOSIS EXCISION; Right     Comment:  HIP 2003: BREAST LUMPECTOMY WITH SENTINEL LYMPH NODE BIOPSY; Left 02/28/2019: CARDIAC CATHETERIZATION; Left 01/23/2016: CATARACT EXTRACTION W/ INTRAOCULAR LENS IMPLANT; Right 02/06/2016: CATARACT EXTRACTION W/ INTRAOCULAR LENS IMPLANT; Left 08/16/2013: COLONOSCOPY 03/23/2017: COLONOSCOPY 12/19/2018: CYSTOURETHROSCOPY; Right     Comment:  WITH STENT PLACEMENT No date: DIAGNOSTIC LAPAROSCOPY 05/11/2013: GANGLION CYST EXCISION; Left     Comment:  FOOT 01/09/2023: LOWER EXTREMITY ANGIOGRAPHY; Right     Comment:  Procedure: Lower Extremity Angiography;  Surgeon:               Renford Dills, MD;  Location: [facility] INVASIVE CV LAB;               Service: Cardiovascular;  Laterality: Right; 2006: ROTATOR CUFF REPAIR; Left 2010: ROTATOR CUFF REPAIR; Left 02/28/2015: TOE AMPUTATION; Left     Comment:  3RD TOE PARTIAL AMPUTATION 1956: TONSILLECTOMY 1995: VASCULARIZED FIBULAR GRAFT; Bilateral     Comment:  BONE FREE GRAFT     Reproductive/Obstetrics negative OB ROS  Anesthesia Physical Anesthesia Plan  ASA: 3  Anesthesia Plan: General ETT   Post-op Pain Management:    Induction: Intravenous  PONV Risk Score and Plan: 4 or greater and Ondansetron, Dexamethasone, Midazolam and Treatment may vary due to age or medical  condition  Airway Management Planned: Oral ETT  Additional Equipment:   Intra-op Plan:   Post-operative Plan: Extubation in OR  Informed Consent: I have reviewed the patients History and Physical, chart, labs and discussed the procedure including the risks, benefits and alternatives for the proposed anesthesia with the patient or authorized representative who has indicated his/her understanding and acceptance.     Dental Advisory Given  Plan Discussed with: Anesthesiologist, CRNA and Surgeon  Anesthesia Plan Comments: (Patient consented for risks of anesthesia including but not limited to:  - adverse reactions to medications - damage to eyes, teeth, lips or other oral mucosa - nerve damage due to positioning  - sore throat or hoarseness - Damage to heart, brain, nerves, lungs, other parts of body or loss of life  Patient voiced understanding.)        Anesthesia Quick Evaluation

## 2023-07-13 NOTE — Anesthesia Postprocedure Evaluation (Signed)
Anesthesia Post Note  Patient: Psychologist, prison and probation services  Procedure(s) Performed: CYSTOSCOPY/URETEROSCOPY/STENT PLACEMENT (Right: Urethra) CYSTOSCOPY WITH RETROGRADE PYELOGRAM  Patient location during evaluation: PACU Anesthesia Type: General Level of consciousness: awake and alert Pain management: pain level controlled Vital Signs Assessment: post-procedure vital signs reviewed and stable Respiratory status: spontaneous breathing, nonlabored ventilation, respiratory function stable and patient connected to nasal cannula oxygen Cardiovascular status: blood pressure returned to baseline and stable Postop Assessment: no apparent nausea or vomiting Anesthetic complications: no  No notable events documented.   Last Vitals:  Vitals:   07/13/23 1445 07/13/23 1455  BP: 137/74 129/80  Pulse: 64 65  Resp: 18 18  Temp:  (!) 36.2 C  SpO2: 92% 94%    Last Pain:  Vitals:   07/13/23 1455  TempSrc:   PainSc: 0-No pain                 Stephanie Coup

## 2023-07-14 ENCOUNTER — Ambulatory Visit
Admission: RE | Admit: 2023-07-14 | Discharge: 2023-07-14 | Disposition: A | Discharge status: Home / Self Care | Payer: Medicare Other | Source: Ambulatory Visit | Attending: Infectious Diseases | Admitting: Infectious Diseases

## 2023-07-14 ENCOUNTER — Encounter: Payer: Self-pay | Admitting: Urology

## 2023-07-14 ENCOUNTER — Other Ambulatory Visit: Payer: Self-pay

## 2023-07-14 DIAGNOSIS — N390 Urinary tract infection, site not specified: Secondary | ICD-10-CM | POA: Insufficient documentation

## 2023-07-14 LAB — URINE CULTURE: Culture: NO GROWTH

## 2023-07-14 MED ORDER — ERTAPENEM SODIUM 1 G IJ SOLR
1.0000 g | INTRAMUSCULAR | Status: DC
  Filled 2023-07-14 (×2): qty 1

## 2023-07-14 MED ORDER — SODIUM CHLORIDE 0.9 % IV SOLN
1.0000 g | Freq: Once | INTRAVENOUS | Status: AC
  Administered 2023-07-14: 1000 mg via INTRAVENOUS
  Filled 2023-07-14: qty 1

## 2023-07-14 NOTE — Progress Notes (Signed)
 [facility] Surgical Posting Form   Surgery Date: Date: 07/15/2023   Surgeon: Dr. [name], MD   Inpt ( No  )   Outpt (Yes)   Obs ( No  )    Diagnosis: N20.0 Right Nephrolithiasis   -CPT: [phone]   Surgery: Right Ureteroscopy with Laser Lithotripsy and Stent Placement   Stop Anticoagulations: Yes will need to hold Plavix, may continue ASA   Clearance request sent on: Date: 06/01/23   *Orders entered into EPIC  Date: 05/27/2023     *Case booked in EPIC  Date: 05/27/2023   *Notified pt of Surgery: Date: 05/27/2023   PRE-OP UA & CX: no   *Placed into Prior Authorization Work Angela Nevin Date: 06/01/23   Assistant/laser/rep:No   Patient will need at least 7 days of IV abxs prior to surgery. Will coordinate with ID. Seeing ID in clinic on 06/02/2023.

## 2023-07-15 ENCOUNTER — Ambulatory Visit: Payer: Medicare Other | Admitting: Registered Nurse

## 2023-07-15 ENCOUNTER — Ambulatory Visit: Payer: Medicare Other

## 2023-07-15 ENCOUNTER — Ambulatory Visit
Admission: RE | Admit: 2023-07-15 | Discharge: 2023-07-15 | Disposition: A | Discharge status: Home / Self Care | Payer: Medicare Other | Attending: Urology | Admitting: Urology

## 2023-07-15 ENCOUNTER — Encounter: Payer: Self-pay | Admitting: Urology

## 2023-07-15 ENCOUNTER — Encounter
Admission: RE | Disposition: A | Discharge status: Home / Self Care | Payer: Self-pay | Source: Home / Self Care | Attending: Urology

## 2023-07-15 DIAGNOSIS — I10 Essential (primary) hypertension: Secondary | ICD-10-CM | POA: Diagnosis not present

## 2023-07-15 DIAGNOSIS — N200 Calculus of kidney: Secondary | ICD-10-CM | POA: Insufficient documentation

## 2023-07-15 DIAGNOSIS — I252 Old myocardial infarction: Secondary | ICD-10-CM | POA: Diagnosis not present

## 2023-07-15 DIAGNOSIS — Z7902 Long term (current) use of antithrombotics/antiplatelets: Secondary | ICD-10-CM | POA: Insufficient documentation

## 2023-07-15 DIAGNOSIS — I739 Peripheral vascular disease, unspecified: Secondary | ICD-10-CM | POA: Diagnosis not present

## 2023-07-15 DIAGNOSIS — I2510 Atherosclerotic heart disease of native coronary artery without angina pectoris: Secondary | ICD-10-CM | POA: Insufficient documentation

## 2023-07-15 DIAGNOSIS — Z7982 Long term (current) use of aspirin: Secondary | ICD-10-CM | POA: Diagnosis not present

## 2023-07-15 DIAGNOSIS — Z87440 Personal history of urinary (tract) infections: Secondary | ICD-10-CM | POA: Diagnosis not present

## 2023-07-15 DIAGNOSIS — N202 Calculus of kidney with calculus of ureter: Secondary | ICD-10-CM | POA: Diagnosis not present

## 2023-07-15 DIAGNOSIS — J4489 Other specified chronic obstructive pulmonary disease: Secondary | ICD-10-CM | POA: Insufficient documentation

## 2023-07-15 DIAGNOSIS — Z87891 Personal history of nicotine dependence: Secondary | ICD-10-CM | POA: Diagnosis not present

## 2023-07-15 HISTORY — PX: CYSTOSCOPY/URETEROSCOPY/HOLMIUM LASER/STENT PLACEMENT: SHX6546

## 2023-07-15 SURGERY — CYSTOSCOPY/URETEROSCOPY/HOLMIUM LASER/STENT PLACEMENT
Anesthesia: General | Site: Ureter | Laterality: Right

## 2023-07-15 MED ORDER — ONDANSETRON HCL 4 MG/2ML IJ SOLN
INTRAMUSCULAR | Status: AC
  Filled 2023-07-15: qty 2

## 2023-07-15 MED ORDER — FAMOTIDINE 20 MG PO TABS
20.0000 mg | ORAL_TABLET | Freq: Once | ORAL | Status: AC
  Administered 2023-07-15: 20 mg via ORAL

## 2023-07-15 MED ORDER — SODIUM CHLORIDE 0.9 % IR SOLN
Status: DC | PRN
  Administered 2023-07-15: 3000 mL

## 2023-07-15 MED ORDER — FENTANYL CITRATE (PF) 100 MCG/2ML IJ SOLN: 25.0000 ug | INTRAMUSCULAR | Status: DC | PRN

## 2023-07-15 MED ORDER — ROCURONIUM BROMIDE 100 MG/10ML IV SOLN
INTRAVENOUS | Status: DC | PRN
  Administered 2023-07-15: 10 mg via INTRAVENOUS
  Administered 2023-07-15: 30 mg via INTRAVENOUS

## 2023-07-15 MED ORDER — ONDANSETRON HCL 4 MG/2ML IJ SOLN: 4.0000 mg | Freq: Once | INTRAMUSCULAR | Status: DC | PRN

## 2023-07-15 MED ORDER — ONDANSETRON HCL 4 MG/2ML IJ SOLN
INTRAMUSCULAR | Status: DC | PRN
Start: 2023-07-15 — End: 2023-07-15
  Administered 2023-07-15 (×2): 4 mg via INTRAVENOUS

## 2023-07-15 MED ORDER — CHLORHEXIDINE GLUCONATE 0.12 % MT SOLN
15.0000 mL | Freq: Once | OROMUCOSAL | Status: AC
  Administered 2023-07-15: 15 mL via OROMUCOSAL

## 2023-07-15 MED ORDER — GLYCOPYRROLATE 0.2 MG/ML IJ SOLN
INTRAMUSCULAR | Status: DC | PRN
  Administered 2023-07-15: .2 mg via INTRAVENOUS

## 2023-07-15 MED ORDER — PROPOFOL 10 MG/ML IV BOLUS
INTRAVENOUS | Status: AC
  Filled 2023-07-15: qty 40

## 2023-07-15 MED ORDER — FENTANYL CITRATE (PF) 100 MCG/2ML IJ SOLN
INTRAMUSCULAR | Status: AC
  Filled 2023-07-15: qty 2

## 2023-07-15 MED ORDER — LACTATED RINGERS IV SOLN: INTRAVENOUS | Status: DC

## 2023-07-15 MED ORDER — MIDAZOLAM HCL 2 MG/2ML IJ SOLN
INTRAMUSCULAR | Status: AC
  Filled 2023-07-15: qty 2

## 2023-07-15 MED ORDER — PROPOFOL 10 MG/ML IV BOLUS
INTRAVENOUS | Status: DC | PRN
  Administered 2023-07-15: 150 ug/kg/min via INTRAVENOUS

## 2023-07-15 MED ORDER — IOHEXOL 180 MG/ML  SOLN
INTRAMUSCULAR | Status: DC | PRN
  Administered 2023-07-15 (×2): 10 mL

## 2023-07-15 MED ORDER — FAMOTIDINE 20 MG PO TABS
ORAL_TABLET | ORAL | Status: AC
  Filled 2023-07-15: qty 1

## 2023-07-15 MED ORDER — DEXAMETHASONE SODIUM PHOSPHATE 10 MG/ML IJ SOLN
INTRAMUSCULAR | Status: AC
  Filled 2023-07-15: qty 1

## 2023-07-15 MED ORDER — SUGAMMADEX SODIUM 200 MG/2ML IV SOLN
INTRAVENOUS | Status: DC | PRN
  Administered 2023-07-15: 200 mg via INTRAVENOUS

## 2023-07-15 MED ORDER — ORAL CARE MOUTH RINSE: 15.0000 mL | Freq: Once | OROMUCOSAL | Status: AC

## 2023-07-15 MED ORDER — SODIUM CHLORIDE 0.9 % IV SOLN
1.0000 g | Freq: Once | INTRAVENOUS | Status: AC
  Administered 2023-07-15: 1000 mg via INTRAVENOUS
  Filled 2023-07-15: qty 1

## 2023-07-15 MED ORDER — FENTANYL CITRATE (PF) 100 MCG/2ML IJ SOLN
INTRAMUSCULAR | Status: DC | PRN
  Administered 2023-07-15 (×2): 50 ug via INTRAVENOUS

## 2023-07-15 MED ORDER — CHLORHEXIDINE GLUCONATE 0.12 % MT SOLN
OROMUCOSAL | Status: AC
  Filled 2023-07-15: qty 15

## 2023-07-15 MED ORDER — EPHEDRINE SULFATE (PRESSORS) 50 MG/ML IJ SOLN
INTRAMUSCULAR | Status: DC | PRN
  Administered 2023-07-15: 10 mg via INTRAVENOUS

## 2023-07-15 MED ORDER — ROCURONIUM BROMIDE 10 MG/ML (PF) SYRINGE
PREFILLED_SYRINGE | INTRAVENOUS | Status: AC
  Filled 2023-07-15: qty 10

## 2023-07-15 MED ORDER — DEXAMETHASONE SODIUM PHOSPHATE 10 MG/ML IJ SOLN
INTRAMUSCULAR | Status: DC | PRN
  Administered 2023-07-15: 10 mg via INTRAVENOUS

## 2023-07-15 MED ORDER — LIDOCAINE HCL (PF) 2 % IJ SOLN
INTRAMUSCULAR | Status: AC
  Filled 2023-07-15: qty 5

## 2023-07-15 MED ORDER — PROPOFOL 1000 MG/100ML IV EMUL
INTRAVENOUS | Status: AC
  Filled 2023-07-15: qty 100

## 2023-07-15 SURGICAL SUPPLY — 29 items
ADH LQ OCL WTPRF AMP STRL LF (MISCELLANEOUS)
ADHESIVE MASTISOL STRL (MISCELLANEOUS) IMPLANT
BAG DRAIN SIEMENS DORNER NS (MISCELLANEOUS) ×1 IMPLANT
BAG DRN NS LF (MISCELLANEOUS) ×1
BASKET ZERO TIP 1.9FR (BASKET) IMPLANT
BRUSH SCRUB EZ 1% IODOPHOR (MISCELLANEOUS) ×1 IMPLANT
BSKT STON RTRVL ZERO TP 1.9FR (BASKET) ×1
CATH URET FLEX-TIP 2 LUMEN 10F (CATHETERS) IMPLANT
CATH URETL OPEN 5X70 (CATHETERS) ×1 IMPLANT
CNTNR URN SCR LID CUP LEK RST (MISCELLANEOUS) IMPLANT
CONT SPEC 4OZ STRL OR WHT (MISCELLANEOUS)
DRAPE UTILITY 15X26 TOWEL STRL (DRAPES) ×1 IMPLANT
DRSG TEGADERM 2-3/8X2-3/4 SM (GAUZE/BANDAGES/DRESSINGS) IMPLANT
FIBER LASER MOSES 200 DFL (Laser) IMPLANT
GLOVE BIO SURGEON STRL SZ 6.5 (GLOVE) ×1 IMPLANT
GOWN STRL REUS W/ TWL LRG LVL3 (GOWN DISPOSABLE) ×2 IMPLANT
GOWN STRL REUS W/TWL LRG LVL3 (GOWN DISPOSABLE) ×2
GUIDEWIRE GREEN .038 145CM (MISCELLANEOUS) IMPLANT
GUIDEWIRE STR DUAL SENSOR (WIRE) ×1 IMPLANT
IV NS IRRIG 3000ML ARTHROMATIC (IV SOLUTION) ×1 IMPLANT
KIT TURNOVER CYSTO (KITS) ×1 IMPLANT
PACK CYSTO AR (MISCELLANEOUS) ×1 IMPLANT
SET CYSTO W/LG BORE CLAMP LF (SET/KITS/TRAYS/PACK) ×1 IMPLANT
SHEATH NAVIGATOR HD 12/14X36 (SHEATH) IMPLANT
STENT URET 6FRX24 CONTOUR (STENTS) IMPLANT
STENT URET 6FRX26 CONTOUR (STENTS) IMPLANT
SURGILUBE 2OZ TUBE FLIPTOP (MISCELLANEOUS) ×1 IMPLANT
TRACTIP FLEXIVA PULSE ID 200 (Laser) IMPLANT
WATER STERILE IRR 500ML POUR (IV SOLUTION) ×1 IMPLANT

## 2023-07-15 NOTE — Discharge Instructions (Addendum)
You have a ureteral stent in place.  This is a tube that extends from your kidney to your bladder.  This may cause urinary bleeding, burning with urination, and urinary frequency.  Please call our office or present to the ED if you develop fevers >101 or pain which is not able to be controlled with oral pain medications.  You may be given either Flomax and/ or ditropan to help with bladder spasms and stent pain in addition to pain medications.    [facility] [address] [place], [place] [address] [phone]    AMBULATORY SURGERY  DISCHARGE INSTRUCTIONS   The drugs that you were given will stay in your system until tomorrow so for the next 24 hours you should not:  Drive an automobile Make any legal decisions Drink any alcoholic beverage   You may resume regular meals tomorrow.  Today it is better to start with liquids and gradually work up to solid foods.  You may eat anything you prefer, but it is better to start with liquids, then soup and crackers, and gradually work up to solid foods.   Please notify your doctor immediately if you have any unusual bleeding, trouble breathing, redness and pain at the surgery site, drainage, fever, or pain not relieved by medication.    Please contact your physician with any problems or Same Day Surgery at [phone], Monday through Friday 6 am to 4 pm, or [place] at [facility] number at [phone].

## 2023-07-15 NOTE — Op Note (Signed)
Date of procedure: 07/15/23  Preoperative diagnosis:  Right renal pelvic stone Right lower pole stone   Postoperative diagnosis:  Same as above   Procedure: Right ureteroscopy with laser lithotripsy Right ureteral stent exchange Right retrograde pyelogram Interpretation of fluoroscopy less than 30 min  Surgeon: [name], MD  Anesthesia: General  Complications: None  Intraoperative findings: Renal pelvic stone and lower pole stone obliterated.   EBL: minimal  Specimens: stone fragement  Drains: 6 x 24 Fr JJ stent  Indication: [name] is a 72 y.o. 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  She been on IV antibiotics.  She returns today for staged procedure..  After reviewing the management options for treatment, she elected to proceed with the above surgical procedure(s). We have discussed the potential benefits and risks of the procedure, side effects of the proposed treatment, the likelihood of the patient achieving the goals of the procedure, and any potential problems that might occur during the procedure or recuperation. Informed consent has been obtained.  Description of procedure:  The patient was taken to the operating room and general anesthesia was induced.  The patient was placed in the dorsal lithotomy position, prepped and draped in the usual sterile fashion, and preoperative antibiotics were administered. A preoperative time-out was performed.   A 21 French scope was advanced per urethra into the bladder.  Attention was turned to the right ureteral orifice which ureteral stent was seen emanating.  The distal coil of the stent was grasped brought to the level of the urethral meatus but it was then cannulated using a sensor wire up to the level of the kidney.  The stent was then removed leaving the wire in place.  A dual-lumen access sheath was used to introduce a second Super Stiff wire up to the level  of the kidney.  This is used a working wire.  The other wire was snapped in place as a safety wire.  A 12/14 French ureteral access sheath was then advanced to the proximal ureter under fluoroscopic guidance.  It went easily.  The inner lumen and the Super Stiff wire were removed.  A digital flexible ureteroscope was then brought in and advanced to the level of the stone.  A 242 m laser fiber was then brought in and using dusting settings of 0.3 J and 50 Hz, the stone was fragmented.  The stone was quite hard and created a lot of dust.  Fragmentation was somewhat painstaking.  I then attempted to navigate to the lower pole which is very difficult.  Ultimately was successful and I was able to go laser out.  I then fragmented this stone.  Next, I used a basket to extract several of the larger pieces which were sent off as stone fragment specimens.  I tried to dust the remainder of the residual stone.  At the end of the procedure, I felt that there was adequate fragmentation.  Retrograde pyelogram through the scope revealed a slightly dilated collecting system but without contrast extravasation and this created a roadmap to ensure that each every calyx was directly visualized and no significant residual stone burden was remaining.  The scope was then backed down the length of the ureter removing the access sheath along the way.  There were no ureteral injuries or any significant residual fragments.  I placed a 6 x 24 French double-J ureteral stent over the wire up to the level of the kidney.  Upon wire withdrawal, there was a full coil noted within the renal pelvis as well as within the bladder.  The bladder was then drained.  The patient was then cleaned and dried, repositioned in supine position, reversed anesthesia, and taken the PACU in stable condition.  Notably, she did receive IV Invanz earlier today as she has been receiving this daily since last Friday.  She tolerated the procedure well.  Plan: Will have  her return next week for stent removal.  Final dose of antibiotics tomorrow.  Will also give her a periprocedural dose of Invanz at the time of stent removal.  [name], M.D.

## 2023-07-15 NOTE — Interval H&P Note (Signed)
History and Physical Interval Note:  07/15/2023 11:29 AM  [name]  has presented today for surgery, with the diagnosis of Right Nephrolithiasis.  The various methods of treatment have been discussed with the patient and family. After consideration of risks, benefits and other options for treatment, the patient has consented to  Procedure(s): CYSTOSCOPY/URETEROSCOPY/HOLMIUM LASER/STENT EXCHANGE (Right) as a surgical intervention.  The patient's history has been reviewed, patient examined, no change in status, stable for surgery.  I have reviewed the patient's chart and labs.  Questions were answered to the patient's satisfaction.    Returns today for staged procedure  RRR CTAB   [name]

## 2023-07-15 NOTE — Anesthesia Procedure Notes (Signed)
Procedure Name: Intubation Date/Time: 07/15/2023 4:09 PM  Performed by: Maryla Morrow., CRNAPre-anesthesia Checklist: Patient identified, Patient being monitored, Timeout performed, Emergency Drugs available and Suction available Patient Re-evaluated:Patient Re-evaluated prior to induction Oxygen Delivery Method: Circle system utilized Preoxygenation: Pre-oxygenation with 100% oxygen Induction Type: IV induction Ventilation: Mask ventilation without difficulty Laryngoscope Size: 3 and McGraph Grade View: Grade I Tube type: Oral Tube size: 7.0 mm Number of attempts: 1 Airway Equipment and Method: Stylet Placement Confirmation: ETT inserted through vocal cords under direct vision, positive ETCO2 and breath sounds checked- equal and bilateral Secured at: 21 cm Tube secured with: Tape Dental Injury: Teeth and Oropharynx as per pre-operative assessment

## 2023-07-15 NOTE — Anesthesia Preprocedure Evaluation (Signed)
Anesthesia Evaluation  Patient identified by MRN, date of birth, ID band Patient awake    Reviewed: Allergy & Precautions, NPO status , Patient's Chart, lab work & pertinent test results  History of Anesthesia Complications (+) PONV and history of anesthetic complications  Airway Mallampati: III  TM Distance: <3 FB Neck ROM: full    Dental  (+) Missing   Pulmonary asthma , COPD, former smoker   Pulmonary exam normal        Cardiovascular Exercise Tolerance: Good hypertension, (-) angina + CAD, + Past MI and + Peripheral Vascular Disease  Normal cardiovascular exam     Neuro/Psych  Neuromuscular disease  negative psych ROS   GI/Hepatic negative GI ROS, Neg liver ROS,,,  Endo/Other  negative endocrine ROS    Renal/GU Renal disease     Musculoskeletal   Abdominal   Peds  Hematology negative hematology ROS (+)   Anesthesia Other Findings Past Medical History: No date: Acute encephalopathy No date: Anemia No date: Asthma No date: AVN (avascular necrosis of bone) (HCC)     Comment:  bilateral hips (femoral heads) 2003: Breast cancer (HCC)     Comment:  left No date: Brown's sheath syndrome of right eye No date: CAD (coronary artery disease) No date: Carpal tunnel syndrome No date: Cataract cortical, senile, bilateral No date: Cellulitis of great toe of right foot No date: Chronic bronchitis (HCC) No date: Complication of anesthesia     Comment:  awareness under anesthesia No date: COPD (chronic obstructive pulmonary disease) (HCC) No date: Edema 04/25/2023: ESBL (extended spectrum beta-lactamase) producing  bacteria infection     Comment:  blood culture No date: Essential hypertension No date: Foot ulcer (HCC)     Comment:  right foot-being seen at wound clinic No date: Frequent UTI No date: Hallux valgus with bunions No date: Hammertoe of left foot No date: Hyperlipidemia No date: Kidney stones No  date: Lung nodule 2019: NSTEMI (non-ST elevated myocardial infarction) (HCC) No date: Osteoarthritis of ankle and foot, right No date: Osteopenia No date: Peripheral neuropathy No date: PONV (postoperative nausea and vomiting) No date: Positive PPD, treated No date: Pre-diabetes No date: PVC's (premature ventricular contractions) No date: PVD (peripheral vascular disease) (HCC) 11/2018: Sepsis secondary to UTI (HCC) No date: Tobacco abuse  Past Surgical History: 12/27/2015: ANKLE FUSION; Right     Comment:  ttc WITH FEMORAL HEAD ALLOGRAFT 2002: BONE EXOSTOSIS EXCISION; Right     Comment:  HIP 2003: BREAST LUMPECTOMY WITH SENTINEL LYMPH NODE BIOPSY; Left 02/28/2019: CARDIAC CATHETERIZATION; Left 01/23/2016: CATARACT EXTRACTION W/ INTRAOCULAR LENS IMPLANT; Right 02/06/2016: CATARACT EXTRACTION W/ INTRAOCULAR LENS IMPLANT; Left 08/16/2013: COLONOSCOPY 03/23/2017: COLONOSCOPY 07/13/2023: CYSTOSCOPY W/ RETROGRADES; N/A     Comment:  Procedure: CYSTOSCOPY WITH RETROGRADE PYELOGRAM;                Surgeon: [name], MD;  Location: [facility];                Service: Urology;  Laterality: N/A; 07/13/2023: CYSTOSCOPY/URETEROSCOPY/HOLMIUM LASER/STENT PLACEMENT;  Right     Comment:  Procedure: CYSTOSCOPY/URETEROSCOPY/STENT PLACEMENT;                Surgeon: [name], MD;  Location: [facility];                Service: Urology;  Laterality: Right; 12/19/2018: CYSTOURETHROSCOPY; Right     Comment:  WITH STENT PLACEMENT No date: DIAGNOSTIC LAPAROSCOPY 05/11/2013: GANGLION CYST EXCISION; Left     Comment:  FOOT  01/09/2023: LOWER EXTREMITY ANGIOGRAPHY; Right     Comment:  Procedure: Lower Extremity Angiography;  Surgeon:               Renford Dills, MD;  Location: [facility];               Service: Cardiovascular;  Laterality: Right; 2006: ROTATOR CUFF REPAIR; Left 2010: ROTATOR CUFF REPAIR; Left 02/28/2015: TOE AMPUTATION; Left     Comment:  3RD TOE PARTIAL  AMPUTATION 1956: TONSILLECTOMY 1995: VASCULARIZED FIBULAR GRAFT; Bilateral     Comment:  BONE FREE GRAFT  BMI    Body Mass Index: 29.21 kg/m      Reproductive/Obstetrics negative OB ROS                             Anesthesia Physical Anesthesia Plan  ASA: 3  Anesthesia Plan: General ETT   Post-op Pain Management:    Induction: Intravenous  PONV Risk Score and Plan: Ondansetron, Dexamethasone, Midazolam and Treatment may vary due to age or medical condition  Airway Management Planned: Oral ETT  Additional Equipment:   Intra-op Plan:   Post-operative Plan: Extubation in OR  Informed Consent: I have reviewed the patients History and Physical, chart, labs and discussed the procedure including the risks, benefits and alternatives for the proposed anesthesia with the patient or authorized representative who has indicated his/her understanding and acceptance.     Dental Advisory Given  Plan Discussed with: Anesthesiologist, CRNA and Surgeon  Anesthesia Plan Comments: (Patient consented for risks of anesthesia including but not limited to:  - adverse reactions to medications - damage to eyes, teeth, lips or other oral mucosa - nerve damage due to positioning  - sore throat or hoarseness - Damage to heart, brain, nerves, lungs, other parts of body or loss of life  Patient voiced understanding.)       Anesthesia Quick Evaluation

## 2023-07-15 NOTE — Anesthesia Postprocedure Evaluation (Signed)
Anesthesia Post Note  Patient: Psychologist, prison and probation services  Procedure(s) Performed: CYSTOSCOPY/URETEROSCOPY/HOLMIUM LASER/STENT EXCHANGE (Right: Ureter)  Patient location during evaluation: PACU Anesthesia Type: General Level of consciousness: awake and alert Pain management: pain level controlled Vital Signs Assessment: post-procedure vital signs reviewed and stable Respiratory status: spontaneous breathing, nonlabored ventilation, respiratory function stable and patient connected to nasal cannula oxygen Cardiovascular status: blood pressure returned to baseline and stable Postop Assessment: no apparent nausea or vomiting Anesthetic complications: no   No notable events documented.   Last Vitals:  Vitals:   07/15/23 1815 07/15/23 1834  BP: (!) 144/78 111/68  Pulse: (!) 51 (!) 52  Resp: 19 16  Temp:  36.7 C  SpO2: 94% 95%    Last Pain:  Vitals:   07/15/23 1834  TempSrc: Temporal  PainSc: 0-No pain                 Lenard Simmer

## 2023-07-15 NOTE — Transfer of Care (Signed)
Immediate Anesthesia Transfer of Care Note  Patient: [name]  Procedure(s) Performed: CYSTOSCOPY/URETEROSCOPY/HOLMIUM LASER/STENT EXCHANGE (Right: Ureter)  Patient Location: PACU  Anesthesia Type:General  Level of Consciousness: drowsy  Airway & Oxygen Therapy: Patient Spontanous Breathing and Patient connected to face mask oxygen  Post-op Assessment: Report given to RN and Post -op Vital signs reviewed and stable  Post vital signs: Reviewed  Last Vitals:  Vitals Value Taken Time  BP 131/70 07/15/23 1748  Temp 41F   Pulse 50 07/15/23 1754  Resp 14 07/15/23 1754  SpO2 100 % 07/15/23 1754  Vitals shown include unfiled device data.  Last Pain:  Vitals:   07/15/23 1112  TempSrc: Temporal  PainSc: 4          Complications: No notable events documented.

## 2023-07-16 ENCOUNTER — Encounter: Payer: Self-pay | Admitting: Urology

## 2023-07-16 ENCOUNTER — Ambulatory Visit
Admit: 2023-07-16 | Discharge: 2023-07-16 | Disposition: A | Discharge status: Home / Self Care | Payer: Medicare Other | Attending: Infectious Diseases | Admitting: Infectious Diseases

## 2023-07-16 ENCOUNTER — Other Ambulatory Visit: Payer: Self-pay

## 2023-07-16 DIAGNOSIS — N200 Calculus of kidney: Secondary | ICD-10-CM | POA: Insufficient documentation

## 2023-07-16 MED ORDER — SODIUM CHLORIDE 0.9 % IV SOLN
1.0000 g | Freq: Once | INTRAVENOUS | Status: AC
  Administered 2023-07-16: 1000 mg via INTRAVENOUS
  Filled 2023-07-16: qty 1

## 2023-07-16 MED ORDER — SODIUM CHLORIDE 0.9 % IV SOLN: 1.0000 g | Freq: Once | INTRAVENOUS | 0 refills | Status: AC

## 2023-07-21 ENCOUNTER — Encounter: Payer: Self-pay | Admitting: Urology

## 2023-07-21 ENCOUNTER — Ambulatory Visit (INDEPENDENT_AMBULATORY_CARE_PROVIDER_SITE_OTHER): Payer: Medicare Other | Admitting: Urology

## 2023-07-21 VITALS — BP 103/69 | HR 63

## 2023-07-21 DIAGNOSIS — Z2989 Encounter for other specified prophylactic measures: Secondary | ICD-10-CM | POA: Diagnosis not present

## 2023-07-21 DIAGNOSIS — Z87442 Personal history of urinary calculi: Secondary | ICD-10-CM | POA: Diagnosis not present

## 2023-07-21 DIAGNOSIS — Z466 Encounter for fitting and adjustment of urinary device: Secondary | ICD-10-CM | POA: Diagnosis not present

## 2023-07-21 DIAGNOSIS — Z1624 Resistance to multiple antibiotics: Secondary | ICD-10-CM | POA: Diagnosis not present

## 2023-07-21 DIAGNOSIS — N200 Calculus of kidney: Secondary | ICD-10-CM

## 2023-07-21 MED ORDER — ERTAPENEM SODIUM 1 G IJ SOLR
1.0000 g | INTRAMUSCULAR | Status: DC
Start: 2023-07-21 — End: 2023-10-08
  Administered 2023-07-21: 1000 mg via INTRAMUSCULAR

## 2023-07-21 NOTE — Addendum Note (Signed)
Addended by: Consuella Lose on: 07/21/2023 01:33 PM   Modules accepted: Orders

## 2023-07-21 NOTE — Progress Notes (Signed)
   07/21/23  CC:  Chief Complaint  Patient presents with   Cysto Stent Removal    HPI: 72 year old female with chronic ESBL and chronic UPJ and right lower pole stone who recently underwent staged right-sided ureteroscopy with pretreatment with Invanz coordinated by infectious disease she returns today for cystoscopy, stent removal.  Per our plan, she did receive 1 g of IM Invanz today periprocedurally.  Most recent intraoperative urine culture was negative after receiving IV antibiotics.  Blood pressure 103/69, pulse 63. NED. A&Ox3.   No respiratory distress   Abd soft, NT, ND Normal external genitalia with patent urethral meatus  Cystoscopy/ Stent removal procedure  Patient identification was confirmed, informed consent was obtained, and patient was prepped using Betadine solution.  Lidocaine jelly was administered per urethral meatus.    Preoperative abx where received prior to procedure.    Procedure: - Flexible cystoscope introduced, without any difficulty.   - Thorough search of the bladder revealed:    normal urethral meatus  Stent seen emanating from right ureteral orifice, grasped with stent graspers, and removed in entirety.     Moderate debris in bladder limiting visualization.  Post-Procedure: - Patient tolerated the procedure well   Assessment/ Plan:  1. Kidney stone Status post stent removal today was uncomplicated  Periprocedural antibiotics as above  We discussed warning symptoms including fevers 101 or higher, severe poorly controlled flank pain amongst others.  Plan for follow-up renal ultrasound in 6 weeks.  I did encourage hydration and continuation of Flomax to help her pass all small fragments/debris. - Ultrasound renal complete; Future - ertapenem (INVANZ) injection 1,000 mg  2. 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

## 2023-09-07 ENCOUNTER — Telehealth: Payer: Self-pay | Admitting: Urology

## 2023-09-07 NOTE — Telephone Encounter (Signed)
Reviewed office note. Appointment for follow up was not set up, not sure the reason and scheduler has not reached out to schedule [place]. Spoke with [name], she is having a surgery in the next 2 weeks and needed to get patient in before that as she is the one to drive patient to her appointments and she is not sure of the length of her recovery at this time. Patient was added to the schedule on 09/16/23. Transferred [name] to scheduler to set up [place] prior to her appointment.

## 2023-09-07 NOTE — Telephone Encounter (Signed)
Patient's daughter called to follow up on when patient's next appointment is. It looks like she is supposed to have RUS and follow up in 6 weeks (this was on visit note from 07/21/23). Order shows expected date for RUS was 08/25/23, but it was never scheduled. Can patient call to schedule? Is it authorized? Please advise patient's daughter [name]) [phone]

## 2023-09-10 ENCOUNTER — Ambulatory Visit
Admission: RE | Admit: 2023-09-10 | Discharge: 2023-09-10 | Disposition: A | Discharge status: Home / Self Care | Payer: Medicare Other | Source: Ambulatory Visit | Attending: Urology | Admitting: Urology

## 2023-09-10 DIAGNOSIS — N200 Calculus of kidney: Secondary | ICD-10-CM | POA: Diagnosis present

## 2023-09-16 ENCOUNTER — Encounter: Payer: Self-pay | Admitting: Urology

## 2023-09-16 ENCOUNTER — Ambulatory Visit (INDEPENDENT_AMBULATORY_CARE_PROVIDER_SITE_OTHER): Payer: Medicare Other | Admitting: Urology

## 2023-09-16 VITALS — BP 110/73 | HR 68 | Ht 63.0 in | Wt 152.0 lb

## 2023-09-16 DIAGNOSIS — N390 Urinary tract infection, site not specified: Secondary | ICD-10-CM

## 2023-09-16 DIAGNOSIS — Z87440 Personal history of urinary (tract) infections: Secondary | ICD-10-CM | POA: Diagnosis not present

## 2023-09-16 DIAGNOSIS — Z87442 Personal history of urinary calculi: Secondary | ICD-10-CM | POA: Diagnosis not present

## 2023-09-16 DIAGNOSIS — N200 Calculus of kidney: Secondary | ICD-10-CM

## 2023-09-16 DIAGNOSIS — Z09 Encounter for follow-up examination after completed treatment for conditions other than malignant neoplasm: Secondary | ICD-10-CM

## 2023-09-16 NOTE — Progress Notes (Signed)
[name],acting as a scribe for [name], MD.,have documented all relevant documentation on the behalf of [name], MD,as directed by  [name], MD while in the presence of [name], MD.  09/16/2023 4:53 PM   [name] 02/27/1951 [id number]  Referring provider: No referring provider defined for this encounter.  Chief Complaint  Patient presents with   Nephrolithiasis    HPI: 72 year-old female who returns today following right ureterescopy with laser lithotripsy and a chronic ESBL infection for which she had to have pre-procedural IV antibiotics. We treated a large kidney chronic kidney stone. The stent was subsequently removed. Ultrasound showed some atrophy on that side, but no residual stone burden or hydronephrosis was visualized.  I personally reviewed that study.   She has a history of hemorrhagic cystitis and was previously on prophylactic macrodantin, which may have contributed to the development of a super-resistant infection.  Today, no symptoms such as burning, urgency, or frequency were reported.    PMH: Past Medical History:  Diagnosis Date   Acute encephalopathy    Anemia    Asthma    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 (femoral heads)   Breast cancer (HCC) 2003   left   Brown's sheath syndrome of right eye    CAD (coronary artery disease)    Carpal tunnel syndrome    Cataract cortical, senile, bilateral    Cellulitis of great toe of right foot    Chronic bronchitis (HCC)    Complication of anesthesia    awareness under anesthesia   COPD (chronic obstructive pulmonary disease) (HCC)    Edema    ESBL (extended spectrum beta-lactamase) producing bacteria infection 04/25/2023   blood culture   Essential hypertension    Foot ulcer (HCC)    right foot-being seen at wound clinic   Frequent UTI    Hallux valgus with bunions    Hammertoe of left foot    Hyperlipidemia    Kidney stones    Lung nodule     NSTEMI (non-ST elevated myocardial infarction) (HCC) 2019   Osteoarthritis of ankle and foot, right    Osteopenia    Peripheral neuropathy    PONV (postoperative nausea and vomiting)    Positive PPD, treated    Pre-diabetes    PVC's (premature ventricular contractions)    PVD (peripheral vascular disease) (HCC)    Sepsis secondary to UTI (HCC) 11/2018   Tobacco abuse     Surgical History: Past Surgical History:  Procedure Laterality Date   ANKLE FUSION Right 12/27/2015   ttc WITH FEMORAL HEAD ALLOGRAFT   BONE EXOSTOSIS EXCISION Right 2002   HIP   BREAST LUMPECTOMY WITH SENTINEL LYMPH NODE BIOPSY Left 2003   CARDIAC CATHETERIZATION Left 02/28/2019   CATARACT EXTRACTION W/ INTRAOCULAR LENS IMPLANT Right 01/23/2016   CATARACT EXTRACTION W/ INTRAOCULAR LENS IMPLANT Left 02/06/2016   COLONOSCOPY  08/16/2013   COLONOSCOPY  03/23/2017   CYSTOSCOPY W/ RETROGRADES N/A 07/13/2023   Procedure: CYSTOSCOPY WITH RETROGRADE PYELOGRAM;  Surgeon: [name], MD;  Location: [facility];  Service: Urology;  Laterality: N/A;   CYSTOSCOPY/URETEROSCOPY/HOLMIUM LASER/STENT PLACEMENT Right 07/13/2023   Procedure: CYSTOSCOPY/URETEROSCOPY/STENT PLACEMENT;  Surgeon: [name], MD;  Location: [facility];  Service: Urology;  Laterality: Right;   CYSTOSCOPY/URETEROSCOPY/HOLMIUM LASER/STENT PLACEMENT Right 07/15/2023   Procedure: CYSTOSCOPY/URETEROSCOPY/HOLMIUM LASER/STENT EXCHANGE;  Surgeon: [name], MD;  Location: [facility];  Service: Urology;  Laterality: Right;   CYSTOURETHROSCOPY Right 12/19/2018   WITH  STENT PLACEMENT   DIAGNOSTIC LAPAROSCOPY     GANGLION CYST EXCISION Left 05/11/2013   FOOT   LOWER EXTREMITY ANGIOGRAPHY Right 01/09/2023   Procedure: Lower Extremity Angiography;  Surgeon: Renford Dills, MD;  Location: [facility];  Service: Cardiovascular;  Laterality: Right;   ROTATOR CUFF REPAIR Left 2006   ROTATOR CUFF REPAIR Left 2010   TOE AMPUTATION Left 02/28/2015    3RD TOE PARTIAL AMPUTATION   TONSILLECTOMY  1956   VASCULARIZED FIBULAR GRAFT Bilateral 1995   BONE FREE GRAFT    Home Medications:  Allergies as of 09/16/2023       Reactions   Penicillins Rash   Oxycodone Itching   Vicodin [hydrocodone-acetaminophen] Itching        Medication List        Accurate as of September 16, 2023  4:53 PM. If you have any questions, ask your nurse or doctor.          STOP taking these medications    oxybutynin 5 MG tablet Commonly known as: DITROPAN Stopped by: [name]   tamsulosin 0.4 MG Caps capsule Commonly known as: Flomax Stopped by: [name]   traMADol 50 MG tablet Commonly known as: Ultram Stopped by: [name]       TAKE these medications    acetaminophen 500 MG tablet Commonly known as: TYLENOL Take 1,000 mg by mouth at bedtime.   albuterol 108 (90 Base) MCG/ACT inhaler Commonly known as: VENTOLIN HFA Inhale 2 puffs into the lungs every 6 (six) hours as needed for wheezing or shortness of breath.   amLODipine 10 MG tablet Commonly known as: NORVASC Take 10 mg by mouth at bedtime.   aspirin EC 81 MG tablet Take 81 mg by mouth daily. Swallow whole.   carvedilol 12.5 MG tablet Commonly known as: COREG Take 12.5 mg by mouth 2 (two) times daily with a meal.   cetirizine 10 MG chewable tablet Commonly known as: ZYRTEC Chew 10 mg by mouth at bedtime.   clopidogrel 75 MG tablet Commonly known as: PLAVIX Take 1 tablet (75 mg total) by mouth daily with breakfast.   Melatonin 10 MG Tabs Take by mouth at bedtime.   mometasone-formoterol 200-5 MCG/ACT Aero Commonly known as: DULERA Inhale 2 puffs into the lungs 2 (two) times daily.   multivitamin with minerals Tabs tablet Take 1 tablet by mouth daily.        Allergies:  Allergies  Allergen Reactions   Penicillins Rash   Oxycodone Itching   Vicodin [Hydrocodone-Acetaminophen] Itching    Family History: Family History  Adopted: Yes     Social History:  reports that she has quit smoking. Her smoking use included cigarettes. She has never used smokeless tobacco. She reports that she does not currently use drugs. She reports that she does not drink alcohol.   Physical Exam: BP 110/73   Pulse 68   Ht 5\' 3"  (1.6 m)   Wt 152 lb (68.9 kg)   BMI 26.93 kg/m   Constitutional:  Alert and oriented, No acute distress. HEENT: [place] AT, moist mucus membranes.  Trachea midline, no masses. Neurologic: Grossly intact, no focal deficits, moving all 4 extremities. Psychiatric: Normal mood and affect.   Pertinent Imaging: EXAM: RENAL / URINARY TRACT ULTRASOUND COMPLETE  COMPARISON:  CT abdomen and pelvis 05/04/2023  FINDINGS: Right Kidney:  Renal measurements: 10.3 x 4.6 x 5.0 cm = volume: 124 mL. Renal cortical thinning and mild scarring. No definite mass or hydronephrosis.  Left Kidney:  Renal measurements: 10.4 x 5.6 x 5.5 cm = volume: 167 mL. Normal renal cortical thickness and echogenicity. There is a 1.6 cm cyst. No imaging follow-up needed. No hydronephrosis.  Bladder:  Appears normal for degree of bladder distention.  Other:  None.  IMPRESSION: 1. No hydronephrosis. 2. Mild right renal cortical thinning and scarring.   Electronically Signed By: Annia Belt M.D. On: 09/10/2023 13:00  This was personally reviewed and I agree with the radiologic interpretation.   Assessment & Plan:    1. Kidney stones - Plan to obtain an X-ray of the kidney in a year to check for new stone formation. - Monitor for symptoms of recurrent stone or infection -We discussed general stone prevention techniques including drinking plenty water with goal of producing 2.5 L urine daily, increased citric acid intake, avoidance of high oxalate containing foods, and decreased salt intake.  Information about dietary recommendations given today.    2.Chronic ESBL infection - Avoid checking for infection in the absence of symptoms  to prevent unnecessary antibiotic treatment.   Return in about 1 year (around 09/15/2024) for KUB.   [facility] [address], [address] [place], [place] [address] [phone]

## 2023-09-23 ENCOUNTER — Ambulatory Visit: Payer: Medicare Other | Admitting: Urology

## 2023-10-08 ENCOUNTER — Ambulatory Visit: Payer: Medicare Other | Attending: Internal Medicine | Admitting: Internal Medicine

## 2023-10-08 ENCOUNTER — Encounter: Payer: Self-pay | Admitting: Internal Medicine

## 2023-10-08 VITALS — BP 110/60 | HR 63 | Ht 63.0 in | Wt 158.0 lb

## 2023-10-08 DIAGNOSIS — I739 Peripheral vascular disease, unspecified: Secondary | ICD-10-CM | POA: Diagnosis not present

## 2023-10-08 DIAGNOSIS — R001 Bradycardia, unspecified: Secondary | ICD-10-CM

## 2023-10-08 DIAGNOSIS — I2510 Atherosclerotic heart disease of native coronary artery without angina pectoris: Secondary | ICD-10-CM | POA: Diagnosis not present

## 2023-10-08 DIAGNOSIS — I959 Hypotension, unspecified: Secondary | ICD-10-CM | POA: Diagnosis present

## 2023-10-08 MED ORDER — CARVEDILOL 12.5 MG PO TABS: 6.2500 mg | ORAL_TABLET | Freq: Two times a day (BID) | ORAL | 3 refills | Status: DC

## 2023-10-08 NOTE — Progress Notes (Signed)
Cardiology Office Note:  .   Date:  10/09/2023  ID:  [name], DOB 1951-06-30, MRN [id number] PCP: Patient, No Pcp Per  [facility] Providers Cardiologist:  Yvonne Kendall, MD     History of Present Illness: .   [name] is a 72 y.o. female with history of coronary artery disease and NSTEMI managed medically in 2019 (CABG was recommended but the patient refused surgery), PAD s/p right SFA/popliteal intervention, hypertension, hyperlipidemia, type 2 diabetes mellitus, asthma/COPD, and kidney stone.  Patient presents today to establish cardiology care.  She was previously followed at [facility] by Dr. Jarold Motto wishes to transition her cardiology care to [facility].  She had a non-STEMI in the setting of hematuria and kidney infection in 2019.  Subsequent cardiac catheterization at [facility] revealed moderate LMCA and LAD disease as well as severe diagonal, LCx, and RCA disease.  Cardiac surgery consultation after resolution of hematuria was recommended, though it sounds like the patient did not wish to undergo CABG.  She has been medically managed since then.  Most recent MPI in July of this year was normal without ischemia or scar.  Ms. Hook complains of intermittently low blood pressure and heart rates without any symptoms.  She was particularly concerned during her hospitalization this past May at [facility].  She notes that several doctors and nurses told her that her medications may need to be adjusted because of her low blood pressure and heart rate.  However this could only be done by a cardiologist.  The patient notes a few "twinges" of chest pain around Christmas almost 2 years ago but has otherwise been without chest pain.  She has stable exertional dyspnea but notes increasing cough with white mucus production.  She has not had any palpitations or lightheadedness.  She had a mechanical fall 2 days ago but notes that since was the first fall in quite some time.  She bruises easily.  She  is currently on aspirin and clopidogrel with recommendations for DAPT for 12 months from the time of her right lower extremity intervention.  ROS: See HPI  Studies Reviewed: Marland Kitchen   EKG Interpretation Date/Time:  Thursday October 08 2023 14:19:40 EDT Ventricular Rate:  63 PR Interval:  182 QRS Duration:  98 QT Interval:  420 QTC Calculation: 429 R Axis:   -22  Text Interpretation: Normal sinus rhythm Normal ECG When compared with ECG of 25-Apr-2023 09:31, No significant change was found Confirmed by Ashland Wiseman [phone]) on 10/09/2023 9:16:54 PM    Pharmacologic MPI (07/06/2023, [facility]): Normal study without evidence of ischemia or scar.  LVEF 59%.  TTE (10/07/2022, [facility]): Normal LV size with mild LVH.  LVEF greater than 55%.  Normal RV size and function.  Trivial TR.  LHC (02/28/2019, [facility]): 40% LMCA stenosis.  Moderate LAD disease.  Severe diagonal, LCx, and RCA disease.  Risk Assessment/Calculations:             Physical Exam:   VS:  BP 110/60 (BP Location: Right Arm, Patient Position: Sitting, Cuff Size: Normal)   Pulse 63   Ht 5\' 3"  (1.6 m)   Wt 158 lb (71.7 kg)   SpO2 97%   BMI 27.99 kg/m    Wt Readings from Last 3 Encounters:  10/08/23 158 lb (71.7 kg)  09/16/23 152 lb (68.9 kg)  07/16/23 164 lb 14.5 oz (74.8 kg)    General:  NAD. Neck: No JVD or HJR. Lungs: Mildly diminished breath sounds throughout without wheezes or crackles. Heart:  Regular rate and rhythm without murmurs, rubs, or gallops. Abdomen: Soft, nontender, nondistended. Extremities: No lower extremity edema.  ASSESSMENT AND PLAN: .    Hypotension and bradycardia: Initial blood pressure today was actually elevated, though the patient was quite stressed at the time.  Repeat blood pressure was lower normal at 110/60.  Review of her vital signs over the last 6 months she has mostly normal blood pressures with only a single hypotensive reading in May.  Her heart rates are also typically in the 60s, no some  bradycardia with heart rates down to 40 bpm are documented on 04/16/2023 around the time of lithotripsy.  It is possible she was having some vagal stimulation at that time.  Given concern for continued bradycardia and hypotension at home, I have recommended reducing carvedilol to 6.25 mg twice daily.  Continue amlodipine 10 mg daily for baseline hypertension.  We will forego additional testing for now.  Coronary artery disease: Catheterization in 2020 at [facility] showed moderate to severe multivessel CAD for which CABG was recommended the patient.  She does not have any angina.  Chronic exertional dyspnea is likely driven primarily by her underlying lung disease though a component of anginal equivalent is possible.  She again states that she would not be interested in undergoing CABG.  Given stable symptoms.  We will continue current medications with the exception of de-escalation of carvedilol as outlined above.  Readdress history of statin intolerance at next visit.  Could consider trial of a PCSK9 inhibitor if the patient is amenable.  PAD: Patient is status post right SFA and popliteal intervention by vascular surgery in 12/2022.  Complete 12 months of DAPT as previously recommended.  Patient is not on a statin as she has declined them in the past due to reported intolerance.  Readdress this at follow-up.    Dispo: Return to clinic in 4-6 weeks.  Signed, Yvonne Kendall, MD

## 2023-10-08 NOTE — Patient Instructions (Addendum)
Medication Instructions:  Your physician recommends the following medication changes.  DECREASE: Carvedilol to 6.25 mg by mouth twice a day  *If you need a refill on your cardiac medications before your next appointment, please call your pharmacy*   Lab Work: No labs ordered today    Testing/Procedures: No test ordered today    Follow-Up: At [facility], you and your health needs are our priority.  As part of our continuing mission to provide you with exceptional heart care, we have created designated Provider Care Teams.  These Care Teams include your primary Cardiologist (physician) and Advanced Practice Providers (APPs -  Physician Assistants and Nurse Practitioners) who all work together to provide you with the care you need, when you need it.  We recommend signing up for the patient portal called "MyChart".  Sign up information is provided on this After Visit Summary.  MyChart is used to connect with patients for Virtual Visits (Telemedicine).  Patients are able to view lab/test results, encounter notes, upcoming appointments, etc.  Non-urgent messages can be sent to your provider as well.   To learn more about what you can do with MyChart, go to [email or web address].    Your next appointment:   4-6 week(s)  Provider:   You may see Yvonne Kendall, MD or one of the following Advanced Practice Providers on your designated Care Team:   Nicolasa Ducking, NP Eula Listen, PA-C Cadence Fransico Michael, PA-C Charlsie Quest, NP

## 2023-10-09 ENCOUNTER — Encounter: Payer: Self-pay | Admitting: Internal Medicine

## 2023-10-09 DIAGNOSIS — I959 Hypotension, unspecified: Secondary | ICD-10-CM | POA: Insufficient documentation

## 2023-10-09 DIAGNOSIS — R001 Bradycardia, unspecified: Secondary | ICD-10-CM | POA: Insufficient documentation

## 2023-10-15 ENCOUNTER — Other Ambulatory Visit: Payer: Self-pay

## 2023-10-15 ENCOUNTER — Telehealth: Payer: Self-pay | Admitting: Internal Medicine

## 2023-10-15 MED ORDER — AMLODIPINE BESYLATE 10 MG PO TABS: 10.0000 mg | ORAL_TABLET | Freq: Every day | ORAL | 3 refills | Status: DC

## 2023-10-15 NOTE — Telephone Encounter (Signed)
Medication refilled as requested to pharmacy of choice as follows:  Sent to pharmacy as: amLODipine (NORVASC) 10 MG tablet  E-Prescribing Status: Receipt confirmed by pharmacy (10/15/2023 10:15 AM EDT)

## 2023-10-15 NOTE — Telephone Encounter (Signed)
*  STAT* If patient is at the pharmacy, call can be transferred to refill team.   1. Which medications need to be refilled? (please list name of each medication and dose if known)   amLODipine (NORVASC) 10 MG tablet    2. Which pharmacy/location (including street and city if local pharmacy) is medication to be sent to?  [facility] [address] - [place], [place] - [address]      3. Do they need a 30 day or 90 day supply? 90 day    Pt is out of medication

## 2024-01-13 ENCOUNTER — Ambulatory Visit: Payer: Medicare Other | Admitting: Internal Medicine

## 2024-02-02 ENCOUNTER — Institutional Professional Consult (permissible substitution): Payer: Medicare Other | Admitting: Pulmonary Disease

## 2024-02-11 ENCOUNTER — Other Ambulatory Visit (INDEPENDENT_AMBULATORY_CARE_PROVIDER_SITE_OTHER): Payer: Self-pay | Admitting: Nurse Practitioner

## 2024-02-11 MED ORDER — CLOPIDOGREL BISULFATE 75 MG PO TABS: 75.0000 mg | ORAL_TABLET | Freq: Every day | ORAL | 11 refills | Status: AC

## 2024-02-24 ENCOUNTER — Ambulatory Visit: Payer: Medicare Other | Admitting: Internal Medicine

## 2024-02-29 ENCOUNTER — Other Ambulatory Visit: Payer: Self-pay | Admitting: Internal Medicine

## 2024-03-04 ENCOUNTER — Telehealth: Payer: Self-pay

## 2024-03-04 ENCOUNTER — Other Ambulatory Visit (HOSPITAL_COMMUNITY): Payer: Self-pay

## 2024-03-04 ENCOUNTER — Encounter: Payer: Self-pay | Admitting: Pulmonary Disease

## 2024-03-04 ENCOUNTER — Ambulatory Visit: Payer: Medicare Other | Admitting: Pulmonary Disease

## 2024-03-04 VITALS — BP 118/66 | HR 80 | Temp 96.9°F | Ht 63.0 in | Wt 172.4 lb

## 2024-03-04 DIAGNOSIS — R0602 Shortness of breath: Secondary | ICD-10-CM | POA: Diagnosis not present

## 2024-03-04 DIAGNOSIS — J4489 Other specified chronic obstructive pulmonary disease: Secondary | ICD-10-CM

## 2024-03-04 LAB — NITRIC OXIDE: Nitric Oxide: 22

## 2024-03-04 MED ORDER — IPRATROPIUM-ALBUTEROL 0.5-2.5 (3) MG/3ML IN SOLN: 3.0000 mL | RESPIRATORY_TRACT | 1 refills | Status: AC | PRN

## 2024-03-04 MED ORDER — BUDESONIDE 0.25 MG/2ML IN SUSP: 0.2500 mg | Freq: Two times a day (BID) | RESPIRATORY_TRACT | 11 refills | Status: DC

## 2024-03-04 MED ORDER — IPRATROPIUM-ALBUTEROL 0.5-2.5 (3) MG/3ML IN SOLN: 3.0000 mL | RESPIRATORY_TRACT | 1 refills | Status: DC | PRN

## 2024-03-04 NOTE — Telephone Encounter (Signed)
*  Pulm  Pharmacy Patient Advocate Encounter  Received notification from [employer] that Prior Authorization for Budesonide 0.25MG /2ML suspension  has been CANCELLED due to medication needs to be processed under Medicare Part B. Please resend script with ICD 10 code and to bill under part B   PA #/Case ID/Reference #: [id number]

## 2024-03-04 NOTE — Telephone Encounter (Signed)
 I tried to contact the pharmacy and they are closed until 3:00pm. I have sent in another prescription and add to it that they file it under Medicare Part B.  Nothing further needed.

## 2024-03-04 NOTE — Progress Notes (Signed)
 Subjective:    Patient ID: [name], female    DOB: 08-May-1951, 73 y.o.   MRN: [id number]  Patient Care Team: Patient, No Pcp Per as PCP - General (General Practice) End, Cristal Deer, MD as PCP - Cardiology (Cardiology)  Chief Complaint  Patient presents with   Consult    Diagnosed with Asthma and COPD. DOE. Wheezing. Cough, dry.    BACKGROUND: Patient is a 73 year old former smoker who presents to establish care with regards to COPD/asthma overlap. Previously followed at [facility] pulmonary clinic diagnosed with COPD in 2015, no significant follow-up after that.   HPI Discussed the use of AI scribe software for clinical note transcription with the patient, who gave verbal consent to proceed.  History of Present Illness   [name] is a 73 year old female with COPD who presents for pulmonary consultation and medication management. She is accompanied by her daughter who is her primary caretaker.  She has been managing her COPD with various inhalers, including Atrovent, Serevent, and Advair HFA. Due to insurance limitations, she currently uses Dulera 3 times a week. Albuterol is used as a rescue inhaler, which she finds moderately effective. Her last pulmonary function test in 2015 confirmed COPD, this was at [facility].  She recently moved from [facility] to [place], seeking a new pulmonary specialist due to difficulties accessing care at [facility]. Financial constraints from being on social security affect her medication options.  She experiences a chronic cough, using Robitussin DM at night to manage it.  She has been admitted to the hospital fairly frequently however, this has been for recurrent urinary tract infections and not for COPD exacerbation.  She experiences shortness of breath with walking, causing a 'whooping sound' when out of breath. No wheezing is reported today. She previously used Singulair for 20 years but stopped due to mood changes, a known psychiatric side effect. She  currently takes Zyrtec for allergies. Her son also cannot take Singulair due to similar side effects.   She depends on a walker or wheelchair for ambulation.  She is currently living with her daughter who is her primary caretaker.    DATA 01/11/2014 PFTs [facility]): FEV1 1.74 L or 75% predicted, FVC 2.82 L or 96% predicted, FEV1/FVC 62%.no bronchodilator response.  Spirometry with flow volume loops demonstrates moderate airway obstruction. Formal lung volume measurements are not available but a normal spirometric vital capacity argues against significant restriction.   Review of Systems A 10 point review of systems was performed and it is as noted above otherwise negative.   Past Medical History:  Diagnosis Date   Acute encephalopathy    Anemia    Asthma    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 (femoral heads)   Breast cancer (HCC) 2003   left   Brown's sheath syndrome of right eye    CAD (coronary artery disease)    Carpal tunnel syndrome    Cataract cortical, senile, bilateral    Cellulitis of great toe of right foot    Chronic bronchitis (HCC)    Complication of anesthesia    awareness under anesthesia   COPD (chronic obstructive pulmonary disease) (HCC)    Edema    ESBL (extended spectrum beta-lactamase) producing bacteria infection 04/25/2023   blood culture   Essential hypertension    Foot ulcer (HCC)    right foot-being seen at wound clinic   Frequent UTI    Hallux valgus with bunions    Hammertoe of left foot  Hyperlipidemia    Kidney stones    Lung nodule    NSTEMI (non-ST elevated myocardial infarction) (HCC) 2019   Osteoarthritis of ankle and foot, right    Osteopenia    Peripheral neuropathy    PONV (postoperative nausea and vomiting)    Positive PPD, treated    Pre-diabetes    PVC's (premature ventricular contractions)    PVD (peripheral vascular disease) (HCC)    Sepsis secondary to UTI (HCC) 11/2018   Tobacco abuse     Past Surgical  History:  Procedure Laterality Date   ANKLE FUSION Right 12/27/2015   ttc WITH FEMORAL HEAD ALLOGRAFT   BONE EXOSTOSIS EXCISION Right 2002   HIP   BREAST LUMPECTOMY WITH SENTINEL LYMPH NODE BIOPSY Left 2003   CARDIAC CATHETERIZATION Left 02/28/2019   CATARACT EXTRACTION W/ INTRAOCULAR LENS IMPLANT Right 01/23/2016   CATARACT EXTRACTION W/ INTRAOCULAR LENS IMPLANT Left 02/06/2016   COLONOSCOPY  08/16/2013   COLONOSCOPY  03/23/2017   CYSTOSCOPY W/ RETROGRADES N/A 07/13/2023   Procedure: CYSTOSCOPY WITH RETROGRADE PYELOGRAM;  Surgeon: [name], MD;  Location: [facility] ORS;  Service: Urology;  Laterality: N/A;   CYSTOSCOPY/URETEROSCOPY/HOLMIUM LASER/STENT PLACEMENT Right 07/13/2023   Procedure: CYSTOSCOPY/URETEROSCOPY/STENT PLACEMENT;  Surgeon: [name], MD;  Location: [facility] ORS;  Service: Urology;  Laterality: Right;   CYSTOSCOPY/URETEROSCOPY/HOLMIUM LASER/STENT PLACEMENT Right 07/15/2023   Procedure: CYSTOSCOPY/URETEROSCOPY/HOLMIUM LASER/STENT EXCHANGE;  Surgeon: [name], MD;  Location: [facility] ORS;  Service: Urology;  Laterality: Right;   CYSTOURETHROSCOPY Right 12/19/2018   WITH STENT PLACEMENT   DIAGNOSTIC LAPAROSCOPY     GANGLION CYST EXCISION Left 05/11/2013   FOOT   LOWER EXTREMITY ANGIOGRAPHY Right 01/09/2023   Procedure: Lower Extremity Angiography;  Surgeon: Renford Dills, MD;  Location: [facility] INVASIVE CV LAB;  Service: Cardiovascular;  Laterality: Right;   ROTATOR CUFF REPAIR Left 2006   ROTATOR CUFF REPAIR Left 2010   TOE AMPUTATION Left 02/28/2015   3RD TOE PARTIAL AMPUTATION   TONSILLECTOMY  1956   VASCULARIZED FIBULAR GRAFT Bilateral 1995   BONE FREE GRAFT    Patient Active Problem List   Diagnosis Date Noted   Hypotension 10/09/2023   Bradycardia 10/09/2023   Chronic bronchitis (HCC) 05/06/2023   Coronary artery disease involving native coronary artery of native heart 05/06/2023   ESBL (extended spectrum beta-lactamase) producing bacteria infection  04/29/2023   Nephrolithiasis 04/29/2023   E coli bacteremia 04/26/2023   UTI (urinary tract infection) 04/25/2023   Acute encephalopathy 04/25/2023   PVD (peripheral vascular disease) (HCC) 01/07/2023   Peripheral neuropathy 01/07/2023   Cellulitis of great toe of right foot 01/06/2023    Family History  Adopted: Yes    Social History   Tobacco Use   Smoking status: Former    Types: Cigarettes    Start date: 2020    Quit date: 10/07/1968    Years since quitting: 55.4   Smokeless tobacco: Never   Tobacco comments:    Started smoking at 73 years old.    Smoked 1 PPD at her heaviest.     Quit smoking in 2020  Substance Use Topics   Alcohol use: No    Allergies  Allergen Reactions   Penicillins Rash   Oxycodone Itching   Vicodin [Hydrocodone-Acetaminophen] Itching    Current Meds  Medication Sig   acetaminophen (TYLENOL) 500 MG tablet Take 1,000 mg by mouth at bedtime.   albuterol (PROVENTIL HFA;VENTOLIN HFA) 108 (90 Base) MCG/ACT inhaler Inhale 2 puffs into the lungs every 6 (six) hours as needed  for wheezing or shortness of breath.   amLODipine (NORVASC) 10 MG tablet Take 1 tablet (10 mg total) by mouth at bedtime.   aspirin EC 81 MG tablet Take 81 mg by mouth daily. Swallow whole.   carvedilol (COREG) 12.5 MG tablet TAKE ONE-HALF TABLET BY MOUTH TWICE A DAY WITH A MEAL   cetirizine (ZYRTEC) 10 MG chewable tablet Chew 10 mg by mouth at bedtime.   clopidogrel (PLAVIX) 75 MG tablet Take 1 tablet (75 mg total) by mouth daily with breakfast.   Melatonin 10 MG TABS Take by mouth at bedtime.   mometasone-formoterol (DULERA) 200-5 MCG/ACT AERO Inhale 2 puffs into the lungs 2 (two) times daily. (Patient taking differently: Inhale 2 puffs into the lungs 2 (two) times daily. 3 times a week.)   Multiple Vitamin (MULTIVITAMIN WITH MINERALS) TABS tablet Take 1 tablet by mouth daily.    Immunization History  Administered Date(s) Administered   Influenza, High Dose Seasonal PF  12/24/2017, 10/15/2018   Influenza-Unspecified 09/07/2014, 11/09/2015, 12/24/2017, 09/21/2018   Pneumococcal Conjugate-13 05/07/2016   Pneumococcal Polysaccharide-23 12/22/1997, 12/31/2017   Zoster, Live 05/25/2012        Objective:     BP 118/66 (BP Location: Right Arm, Cuff Size: Normal)   Pulse 80   Temp (!) 96.9 F (36.1 C)   Ht 5\' 3"  (1.6 m)   Wt 172 lb 6.4 oz (78.2 kg)   SpO2 96%   BMI 30.54 kg/m   SpO2: 96 % O2 Device: None (Room air)  GENERAL: Overweight woman, no acute distress, presents in wheelchair.  No conversational dyspnea.  Somewhat disheveled. HEAD: Normocephalic, atraumatic.  EYES: Pupils equal, round, reactive to light.  No scleral icterus.  MOUTH: Jiles, oral mucosa moist.  No thrush. NECK: Supple. No thyromegaly. Trachea midline. No JVD.  No adenopathy. PULMONARY: Good air entry bilaterally.  Coarse, otherwise, no adventitious sounds. CARDIOVASCULAR: S1 and S2. Regular rate and rhythm.  No rubs, murmurs or gallops heard. ABDOMEN: Obese otherwise benign. MUSCULOSKELETAL: No joint deformity, no clubbing, no edema.  NEUROLOGIC: No overt neurodeficit. SKIN: Intact,warm,dry. PSYCH: Talkative, cooperative.   Ambulatory oxymetry was performed today: Limited ambulation due to need for walker.  At rest on room air oxygen saturation was 98%, the patient ambulated at a slow pace, completed on laps, O2 nadir 98%, mild shortness of breath.  Resting heart rate was 26 bpm at maximum for this exercise 101 bpm.  Lab Results  Component Value Date   NITRICOXIDE 22 03/04/2024  *No evidence of type II inflammation   Assessment & Plan:     ICD-10-CM   1. COPD with chronic bronchitis and emphysema (HCC)  J44.89 AMB REFERRAL FOR DME   J43.9 Pulmonary Function Test [facility] Only    2. SOB (shortness of breath)  R06.02 Nitric oxide    Pulmonary Function Test [facility] Only     Orders Placed This Encounter  Procedures   AMB REFERRAL FOR DME    Referral Priority:   Routine     Referral Type:   Durable Medical Equipment Purchase    Number of Visits Requested:   1   Nitric oxide   Pulmonary Function Test [facility] Only    Standing Status:   Future    Expected Date:   04/04/2024    Expiration Date:   03/04/2025    Full PFT: includes the following: basic spirometry, spirometry pre & post bronchodilator, diffusion capacity (DLCO), lung volumes:   Full PFT    This test can only be  performed at:   [facility] ordered this encounter  Medications   ipratropium-albuterol (DUONEB) 0.5-2.5 (3) MG/3ML SOLN    Sig: Take 3 mLs by nebulization every 4 (four) hours as needed.    Dispense:  360 mL    Refill:  1   budesonide (PULMICORT) 0.25 MG/2ML nebulizer solution    Sig: Take 2 mLs (0.25 mg total) by nebulization 2 (two) times daily.    Dispense:  60 mL    Refill:  11   Discussion:    Chronic Obstructive Pulmonary Disease (COPD) She has COPD with previous pulmonary function tests confirming the diagnosis. She uses Dulera twice daily, and Albuterol as a rescue inhaler, which she reports is effective. She experiences chronic cough and dyspnea, especially post-exertion. Insurance limitations necessitate cost-effective treatments, no good coverage of inhalers noted.  Will need to switch to nebulized medications for cost effectiveness. - Order pulmonary function tests (PFTs) to reassess COPD status - Order nebulizer and supplies - DuoNeb 2-4 times a day - Budesonide 0.25 mg - Order nebulizer with Duoneb solution for use three to four times daily - Prescribe Pulmicort (budesonide) for use twice daily after Duoneb treatments - Send prescription for Albuterol (ProAir) as needed  Medication Management Issues She faces challenges with medication costs due to insurance coverage limitations, leading to reliance on generics. Nebulizer solutions are covered, providing a viable alternative. Singulair was advised against due to potential psychiatric side effects and  ineffectiveness in COPD. - Order nebulizer and supplies through [employer] - Advise reassessment of insurance plan during the next enrollment period to better cover medications - Provide information on community pharmacy offering affordable nebulizers  Follow-up Ongoing monitoring of COPD and medication management is required. - Schedule follow-up appointment in three months - Schedule pulmonary function tests (PFTs)     Advised if symptoms do not improve or worsen, to please contact office for sooner follow up or seek emergency care.    I spent 60 minutes of dedicated to the care of this patient on the date of this encounter to include pre-visit review of records, face-to-face time with the patient discussing conditions above, post visit ordering of testing, clinical documentation with the electronic health record, making appropriate referrals as documented, and communicating necessary findings to members of the patients care team.   C. Danice Goltz, MD Advanced Bronchoscopy PCCM [facility]    *This note was dictated using voice recognition software/Dragon.  Despite best efforts to proofread, errors can occur which can change the meaning. Any transcriptional errors that result from this process are unintentional and may not be fully corrected at the time of dictation.

## 2024-03-04 NOTE — Addendum Note (Signed)
 Addended by: Bonney Leitz on: 03/04/2024 02:21 PM   Modules accepted: Orders

## 2024-03-04 NOTE — Addendum Note (Signed)
 Addended by: Bonney Leitz on: 03/04/2024 02:22 PM   Modules accepted: Orders

## 2024-03-04 NOTE — Telephone Encounter (Signed)
*  Pulm  Pharmacy Patient Advocate Encounter  Received notification from [employer] that Prior Authorization for Ipratropium-Albuterol 0.5-2.5 (3)MG/3ML solution  has been CANCELLED due to medication needs to be processed under Medicare Part B. Please resend script with ICD 10 and to run under Part B   PA #/Case ID/Reference #: [id number]

## 2024-03-04 NOTE — Patient Instructions (Signed)
 VISIT SUMMARY:  Today, we discussed your COPD management and medication options. We reviewed your current inhaler use and addressed your chronic cough and shortness of breath. We also talked about the financial constraints affecting your medication choices and planned for further tests and follow-up appointments.  YOUR PLAN:  -CHRONIC OBSTRUCTIVE PULMONARY DISEASE (COPD): COPD is a chronic lung condition that makes it hard to breathe. We will reassess your condition with new pulmonary function tests. You should continue using the Advair HFA inhaler twice daily and the Atrovent inhaler up to four times daily, using Advair before Atrovent for better results. We also prescribed a nebulizer with Duoneb solution to use three to four times daily and Pulmicort twice daily after Duoneb treatments. Continue using Albuterol as needed for sudden symptoms.  -MEDICATION MANAGEMENT ISSUES: We understand that medication costs are a concern due to insurance limitations. We have ordered a nebulizer and supplies through [employer], which are covered by your insurance. We recommend reassessing your insurance plan during the next enrollment period to better cover your medications. We also provided information on a community pharmacy that offers affordable nebulizers.  INSTRUCTIONS:  Please schedule a follow-up appointment in three months and arrange for pulmonary function tests (PFTs) to reassess your COPD status.

## 2024-03-21 DIAGNOSIS — N390 Urinary tract infection, site not specified: Secondary | ICD-10-CM | POA: Insufficient documentation

## 2024-03-21 DIAGNOSIS — I252 Old myocardial infarction: Secondary | ICD-10-CM | POA: Insufficient documentation

## 2024-03-21 DIAGNOSIS — R7309 Other abnormal glucose: Secondary | ICD-10-CM | POA: Insufficient documentation

## 2024-03-21 DIAGNOSIS — R768 Other specified abnormal immunological findings in serum: Secondary | ICD-10-CM | POA: Insufficient documentation

## 2024-03-31 ENCOUNTER — Ambulatory Visit: Payer: Self-pay | Admitting: Nurse Practitioner

## 2024-04-04 ENCOUNTER — Ambulatory Visit: Payer: Medicare Other | Admitting: Nurse Practitioner

## 2024-04-07 ENCOUNTER — Ambulatory Visit: Payer: Medicare Other | Attending: Internal Medicine | Admitting: Internal Medicine

## 2024-04-07 ENCOUNTER — Encounter: Payer: Self-pay | Admitting: Internal Medicine

## 2024-04-07 VITALS — BP 130/82 | HR 78 | Ht 63.0 in | Wt 170.0 lb

## 2024-04-07 DIAGNOSIS — Z79899 Other long term (current) drug therapy: Secondary | ICD-10-CM | POA: Insufficient documentation

## 2024-04-07 DIAGNOSIS — E785 Hyperlipidemia, unspecified: Secondary | ICD-10-CM | POA: Diagnosis present

## 2024-04-07 DIAGNOSIS — I959 Hypotension, unspecified: Secondary | ICD-10-CM | POA: Insufficient documentation

## 2024-04-07 DIAGNOSIS — I739 Peripheral vascular disease, unspecified: Secondary | ICD-10-CM | POA: Diagnosis present

## 2024-04-07 DIAGNOSIS — I2510 Atherosclerotic heart disease of native coronary artery without angina pectoris: Secondary | ICD-10-CM | POA: Insufficient documentation

## 2024-04-07 NOTE — Patient Instructions (Signed)
 Medication Instructions:   Your Physician recommend you continue on your current medication as directed.     *If you need a refill on your cardiac medications before your next appointment, please call your pharmacy*  Lab Work:  Your provider would like for you to have following labs drawn today CMP, Lipid panel.    If you have labs (blood work) drawn today and your tests are completely normal, you will receive your results only by: MyChart Message (if you have MyChart) OR A paper copy in the mail If you have any lab test that is abnormal or we need to change your treatment, we will call you to review the results.  Testing/Procedures:  Your physician has requested that you have a lower extremity arterial duplex. During this test, ultrasound is used to evaluate arterial blood flow in the legs. Allow one hour for this exam. There are no restrictions or special instructions. This will take place at [address] [facility], [place] [address]  Please note: We ask at that you not bring children with you during ultrasound (echo/ vascular) testing. Due to room size and safety concerns, children are not allowed in the ultrasound rooms during exams. Our front office staff cannot provide observation of children in our lobby area while testing is being conducted. An adult accompanying a patient to their appointment will only be allowed in the ultrasound room at the discretion of the ultrasound technician under special circumstances. We apologize for any inconvenience.    Your physician has requested that you have an ankle brachial index (ABI). During this test an ultrasound and blood pressure cuff are used to evaluate the arteries that supply the arms and legs with blood.  Allow thirty minutes for this exam.  There are no restrictions or special instructions.  This will take place at [address] [address] [facility], [place] [address]   Follow-Up: At [facility], you and your health needs are our priority.  As part of our continuing mission to provide you with exceptional heart care, our providers are all part of one team.  This team includes your primary Cardiologist (physician) and Advanced Practice Providers or APPs (Physician Assistants and Nurse Practitioners) who all work together to provide you with the care you need, when you need it.  Your next appointment:   6 month(s)  Provider:   You may see Sammy Crisp, MD or one of the following Advanced Practice Providers on your designated Care Team:   Laneta Pintos, NP Gildardo Labrador, PA-C Varney Gentleman, PA-C Cadence [place], PA-C Ronald Cockayne, NP Morey Ar, NP    We recommend signing up for the patient portal called "MyChart".  Sign up information is provided on this After Visit Summary.  MyChart is used to connect with patients for Virtual Visits (Telemedicine).  Patients are able to view lab/test results, encounter notes, upcoming appointments, etc.  Non-urgent messages can be sent to your provider as well.   To learn more about what you can do with MyChart, go to [email or web address].

## 2024-04-07 NOTE — Progress Notes (Signed)
 Cardiology Office Note:  .   Date:  04/07/2024  ID:  [name], DOB 1951-12-02, MRN [id number] PCP: Patient, No Pcp Per  [facility] Providers Cardiologist:  Yvonne Kendall, MD     History of Present Illness: .   [name] is a 73 y.o. female with history of coronary artery disease and NSTEMI managed medically in 2019 (CABG was recommended but the patient refused surgery), PAD s/p right SFA/popliteal intervention, hypertension, hyperlipidemia, type 2 diabetes mellitus, asthma/COPD, and kidney stone, who presents for follow-up of coronary artery disease and intermittent hypotension.  I met her in 09/2023, at which time she wished to transition her care from [facility] to our practice.  Her main complaint was of intermittent low blood pressure and heart rates, albeit asymptomatic.  We agreed to reduce carvedilol to 6.25 mg twice daily.  We did not pursue any further testing.  Today, [name] reports that she is feeling a little bit better since our last visit.  She was recently started on some new nebulizer treatments and feels like this is helping her shortness of breath.  She still has exertional dyspnea even when walking short distances.  She is supposed to ambulate with a walker at home but she does not do this consistently.  She presents in a wheelchair today.  She has not had any chest pain.  Chronic right lower extremity edema is stable.  She has some intermittent pain in the right leg, chronic.  She has not followed up with vascular surgery in about a year.  She denies palpitations, lightheadedness, and bleeding.  ROS: See HPI  Studies Reviewed: Marland Kitchen   EKG Interpretation Date/Time:  Thursday April 07 2024 09:42:41 EDT Ventricular Rate:  65 PR Interval:  202 QRS Duration:  98 QT Interval:  450 QTC Calculation: 468 R Axis:   -18  Text Interpretation: Normal sinus rhythm Inferior infarct , age undetermined Abnormal ECG When compared with ECG of 08-Oct-2023 14:19, No significant  change was found Confirmed by Marelyn Rouser, Cristal Deer [phone]) on 04/07/2024 5:08:06 PM    Pharmacologic MPI (07/06/2023, [facility]): Normal study without evidence of ischemia or scar.  LVEF 59%.   TTE (10/07/2022, [facility]): Normal LV size with mild LVH.  LVEF greater than 55%.  Normal RV size and function.  Trivial TR.   LHC (02/28/2019, [facility]): 40% LMCA stenosis.  Moderate LAD disease.  Severe diagonal, LCx, and RCA disease.  Risk Assessment/Calculations:            Physical Exam:   VS:  BP 130/82 (BP Location: Left Arm, Patient Position: Sitting, Cuff Size: Normal)   Pulse 78   Ht 5\' 3"  (1.6 m)   Wt 170 lb (77.1 kg)   SpO2 95%   BMI 30.11 kg/m    Wt Readings from Last 3 Encounters:  04/07/24 170 lb (77.1 kg)  03/04/24 172 lb 6.4 oz (78.2 kg)  10/08/23 158 lb (71.7 kg)    General:  NAD.  Accompanied by her daughter and granddaughter. Neck: No JVD or HJR. Lungs: Mildly diminished breath sounds throughout without wheezes or crackles. Heart: Regular rate and rhythm without murmurs, rubs, or gallops. Abdomen: Soft, nontender, nondistended. Extremities: 1+ right calf edema.  ASSESSMENT AND PLAN: .    Coronary artery disease: [name] denies chest pain.  Her chronic exertional dyspnea has improved a little bit with new nebulizer treatments.  She was previously recommended to undergo CABG due to multivessel CAD but declined.  We will continue with medical therapy.  She is not currently on a statin, having had some intolerances in the past and refusing to take one.  I will check a lipid panel today to help us  gauge if ezetimibe alone is reasonable or if we need to consider Is that or a PCSK9 inhibitor (this may be complicated by financial constraints).  PAD: Intermittent right calf pain and frequent swelling reported.  We will obtain ABIs and lower extremity arterial Dopplers for reevaluation of [name]'s PAD.  If further vascular evaluation is necessary, she prefers not to [place] returned to  [facility].  Continue aspirin and clopidogrel.  Hypotension: Blood pressure normal today following reduction in carvedilol at our last visit.  No medication changes at this time.  Hyperlipidemia: Ms. Crowl reports intolerance to statins in the past though she does not provide specifics.  She would not wish to try 1 again.  I will check a lipid panel and CMP today with plans to at least initiate ezetimibe if not Nexlizet or a PCSK9 inhibitor (if not cost prohibitive).    Dispo: Return to clinic in 6 months  Signed, Sammy Crisp, MD

## 2024-04-08 LAB — COMPREHENSIVE METABOLIC PANEL WITH GFR
ALT: 15 IU/L (ref 0–32)
AST: 17 IU/L (ref 0–40)
Albumin: 4.2 g/dL (ref 3.8–4.8)
Alkaline Phosphatase: 103 IU/L (ref 44–121)
BUN/Creatinine Ratio: 14 (ref 12–28)
BUN: 16 mg/dL (ref 8–27)
Bilirubin Total: 0.2 mg/dL (ref 0.0–1.2)
CO2: 22 mmol/L (ref 20–29)
Calcium: 9.4 mg/dL (ref 8.7–10.3)
Chloride: 102 mmol/L (ref 96–106)
Creatinine, Ser: 1.13 mg/dL — ABNORMAL HIGH (ref 0.57–1.00)
Globulin, Total: 2.9 g/dL (ref 1.5–4.5)
Glucose: 104 mg/dL — ABNORMAL HIGH (ref 70–99)
Potassium: 4.1 mmol/L (ref 3.5–5.2)
Sodium: 139 mmol/L (ref 134–144)
Total Protein: 7.1 g/dL (ref 6.0–8.5)
eGFR: 51 mL/min/{1.73_m2} — ABNORMAL LOW (ref >59)

## 2024-04-08 LAB — LIPID PANEL
Chol/HDL Ratio: 6 ratio — ABNORMAL HIGH (ref 0.0–4.4)
Cholesterol, Total: 209 mg/dL — ABNORMAL HIGH (ref 100–199)
HDL: 35 mg/dL — ABNORMAL LOW (ref >39)
LDL Chol Calc (NIH): 122 mg/dL — ABNORMAL HIGH (ref 0–99)
Triglycerides: 294 mg/dL — ABNORMAL HIGH (ref 0–149)
VLDL Cholesterol Cal: 52 mg/dL — ABNORMAL HIGH (ref 5–40)

## 2024-05-04 ENCOUNTER — Ambulatory Visit

## 2024-05-18 ENCOUNTER — Ambulatory Visit

## 2024-06-07 ENCOUNTER — Ambulatory Visit: Admitting: Pulmonary Disease

## 2024-06-07 ENCOUNTER — Encounter

## 2024-07-06 ENCOUNTER — Encounter

## 2024-07-07 ENCOUNTER — Encounter: Payer: Self-pay | Admitting: Pulmonary Disease

## 2024-07-07 ENCOUNTER — Ambulatory Visit (INDEPENDENT_AMBULATORY_CARE_PROVIDER_SITE_OTHER): Admitting: Pulmonary Disease

## 2024-07-07 ENCOUNTER — Other Ambulatory Visit: Payer: Self-pay

## 2024-07-07 ENCOUNTER — Ambulatory Visit: Admitting: Pulmonary Disease

## 2024-07-07 VITALS — BP 112/60 | HR 68 | Temp 98.8°F | Ht 63.0 in | Wt 178.0 lb

## 2024-07-07 DIAGNOSIS — R0602 Shortness of breath: Secondary | ICD-10-CM

## 2024-07-07 DIAGNOSIS — J439 Emphysema, unspecified: Secondary | ICD-10-CM

## 2024-07-07 DIAGNOSIS — J4489 Other specified chronic obstructive pulmonary disease: Secondary | ICD-10-CM

## 2024-07-07 DIAGNOSIS — Z87891 Personal history of nicotine dependence: Secondary | ICD-10-CM | POA: Diagnosis not present

## 2024-07-07 DIAGNOSIS — J449 Chronic obstructive pulmonary disease, unspecified: Secondary | ICD-10-CM | POA: Diagnosis not present

## 2024-07-07 LAB — PULMONARY FUNCTION TEST
DL/VA % pred: 86 %
DL/VA: 3.61 ml/min/mmHg/L
DLCO unc % pred: 78 %
DLCO unc: 14.35 ml/min/mmHg
FEF 25-75 Post: 0.78 L/s
FEF 25-75 Pre: 0.56 L/s
FEF2575-%Change-Post: 40 %
FEF2575-%Pred-Post: 45 %
FEF2575-%Pred-Pre: 32 %
FEV1-%Change-Post: 11 %
FEV1-%Pred-Post: 58 %
FEV1-%Pred-Pre: 52 %
FEV1-Post: 1.22 L
FEV1-Pre: 1.1 L
FEV1FVC-%Change-Post: 0 %
FEV1FVC-%Pred-Pre: 75 %
FEV6-%Change-Post: 10 %
FEV6-%Pred-Post: 79 %
FEV6-%Pred-Pre: 71 %
FEV6-Post: 2.1 L
FEV6-Pre: 1.9 L
FEV6FVC-%Change-Post: 0 %
FEV6FVC-%Pred-Post: 103 %
FEV6FVC-%Pred-Pre: 103 %
FVC-%Change-Post: 11 %
FVC-%Pred-Post: 77 %
FVC-%Pred-Pre: 69 %
FVC-Post: 2.14 L
FVC-Pre: 1.92 L
Post FEV1/FVC ratio: 57 %
Post FEV6/FVC ratio: 98 %
Pre FEV1/FVC ratio: 57 %
Pre FEV6/FVC Ratio: 99 %
RV % pred: 144 %
RV: 3.17 L
TLC % pred: 107 %
TLC: 5.25 L

## 2024-07-07 NOTE — Patient Instructions (Signed)
 VISIT SUMMARY:  Today, we discussed your COPD management and reviewed your current treatment plan. You mentioned that you have quit smoking and are using a portable nebulizer with DuoNeb to manage your symptoms. We also talked about the challenges you are facing with insurance coverage for your medications.  YOUR PLAN:  -CHRONIC OBSTRUCTIVE PULMONARY DISEASE (COPD): COPD is a chronic lung condition that makes it hard to breathe due to airflow blockage. You have experienced a decline in lung function since 2015, likely due to your smoking history. Continue using your portable nebulizer with DuoNeb (albuterol  and ipratropium) as it helps manage your symptoms.  Continue budesonide  as able.  The DuoNeb is the most important one for now.  Check with the pharmacy to see if your nebulized medications can be covered under Part B Medicare. We will reassess your lung function annually.  INSTRUCTIONS:  Please schedule a follow-up appointment in three months.

## 2024-07-07 NOTE — Patient Instructions (Signed)
 Full PFT completed today ? ?

## 2024-07-07 NOTE — Progress Notes (Unsigned)
 Subjective:    Patient ID: [name], female    DOB: April 17, 1951, 73 y.o.   MRN: [id number]  Patient Care Team: Patient, No Pcp Per as PCP - General (General Practice) End, Lonni, MD as PCP - Cardiology (Cardiology)  Chief Complaint  Patient presents with   Follow-up    COPD, review PFT  Duoneb hasn't been quite as effective as hoped for, but finds the inhalers to work better.    BACKGROUND/INTERVAL:Patient is a 73 year old former smoker who presents for follow-up with regards to COPD/asthma overlap. Previously followed at [facility] pulmonary clinic diagnosed with COPD in 2015, no significant follow-up after that.  First evaluated here on 04 March 2024.  HPI Discussed the use of AI scribe software for clinical note transcription with the patient, who gave verbal consent to proceed.  History of Present Illness   [name] is a 73 year old female with COPD who presents for follow-up.  She reports a history of smoking, including a five-year period of smoking between her last pulmonary function test in 2015 and now. She has since quit smoking and has not smoked since living with her daughter, who enforces a no-smoking rule.  She uses a portable nebulizer for COPD management, finding it effective, although albuterol  sometimes takes a while to relieve wheezing. She uses DuoNeb (albuterol  and ipratropium) but does not use budesonide  daily due to insurance coverage issues. She has experienced difficulty getting her medications covered by her insurance, specifically mentioning issues with [employer] and Medicare Part B coverage.  Overall though she feels that DuoNeb at least this helping her as before she had no bronchodilator treatment whatsoever due to noncoverage of medications.  She experiences shortness of breath when walking to the car and uses her nebulizer in the early morning to manage her symptoms. No current smoking.      DATA 01/11/2014 PFTs [facility]): FEV1 1.74 L or 75% predicted,  FVC 2.82 L or 96% predicted, FEV1/FVC 62%.no bronchodilator response.  Spirometry with flow volume loops demonstrates moderate airway obstruction. Formal lung volume measurements are not available but a normal spirometric vital capacity argues against significant restriction. 07/07/2024 PFTs: FEV1 1.10 L or 52% predicted, FVC 1.92 L or 69% predicted, FEV1/FVC 57%, no significant bronchodilator response, lung volumes show mild hyperinflation and air trapping, mild diffusion capacity impairment.  Compared to prior study performed in 2015 there has been significant decline in FEV1.  Review of Systems A 10 point review of systems was performed and it is as noted above otherwise negative.   Patient Active Problem List   Diagnosis Date Noted   Hyperlipidemia LDL goal <70 04/07/2024   Recurrent UTI (urinary tract infection) 03/21/2024   History of non-ST elevation myocardial infarction (NSTEMI) 03/21/2024   ANA positive 03/21/2024   Elevated hemoglobin A1c measurement 03/21/2024   Hypotension 10/09/2023   Bradycardia 10/09/2023   COPD with chronic bronchitis and emphysema (HCC) 05/06/2023   Coronary artery disease involving native coronary artery of native heart 05/06/2023   History of kidney stones 04/29/2023   PAD (peripheral artery disease) (HCC) 01/07/2023   Peripheral neuropathy 01/07/2023    Social History   Tobacco Use   Smoking status: Former    Types: Cigarettes    Start date: 2020    Quit date: 10/07/1968    Years since quitting: 55.7   Smokeless tobacco: Never   Tobacco comments:    Started smoking at 73 years old.    Smoked 1 PPD at her heaviest.  Quit smoking in 2020  Substance Use Topics   Alcohol use: No    Allergies  Allergen Reactions   Molds & Smuts Cough   Penicillins Rash   Pollen Extract Cough   Oxycodone  Itching   Vicodin [Hydrocodone-Acetaminophen ] Itching    Current Meds  Medication Sig   acetaminophen  (TYLENOL ) 500 MG tablet Take 1,000 mg by  mouth at bedtime.   albuterol  (PROVENTIL  HFA;VENTOLIN  HFA) 108 (90 Base) MCG/ACT inhaler Inhale 2 puffs into the lungs every 6 (six) hours as needed for wheezing or shortness of breath.   amLODipine  (NORVASC ) 10 MG tablet Take 1 tablet (10 mg total) by mouth at bedtime.   aspirin  EC 81 MG tablet Take 81 mg by mouth daily. Swallow whole.   budesonide  (PULMICORT ) 0.25 MG/2ML nebulizer solution Take 2 mLs (0.25 mg total) by nebulization 2 (two) times daily.   carvedilol  (COREG ) 12.5 MG tablet TAKE ONE-HALF TABLET BY MOUTH TWICE A DAY WITH A MEAL   cetirizine (ZYRTEC) 10 MG chewable tablet Chew 10 mg by mouth at bedtime.   clopidogrel  (PLAVIX ) 75 MG tablet Take 1 tablet (75 mg total) by mouth daily with breakfast.   ipratropium-albuterol  (DUONEB) 0.5-2.5 (3) MG/3ML SOLN Take 3 mLs by nebulization every 4 (four) hours as needed.   Melatonin 10 MG TABS Take by mouth at bedtime.   Multiple Vitamin (MULTIVITAMIN WITH MINERALS) TABS tablet Take 1 tablet by mouth daily.    Immunization History  Administered Date(s) Administered   Influenza, High Dose Seasonal PF 12/24/2017, 10/15/2018   Influenza-Unspecified 09/08/2013, 09/07/2014, 11/09/2015, 12/24/2017, 09/21/2018   Pneumococcal Conjugate-13 05/07/2016   Pneumococcal Polysaccharide-23 12/22/1997, 12/31/2017   Zoster, Live 05/25/2012        Objective:     BP 112/60 (BP Location: Right Arm, Patient Position: Sitting, Cuff Size: Large)   Pulse 68   Temp 98.8 F (37.1 C) (Oral)   Ht 5' 3 (1.6 m)   Wt 178 lb (80.7 kg)   SpO2 95%   BMI 31.53 kg/m   SpO2: 95 %  GENERAL: Overweight woman, no acute distress, presents in wheelchair.  No conversational dyspnea.  Somewhat disheveled. HEAD: Normocephalic, atraumatic.  EYES: Pupils equal, round, reactive to light.  No scleral icterus.  MOUTH: Jiles, oral mucosa moist.  No thrush. NECK: Supple. No thyromegaly. Trachea midline. No JVD.  No adenopathy. PULMONARY: Good air entry bilaterally.   Coarse, otherwise, no adventitious sounds. CARDIOVASCULAR: S1 and S2. Regular rate and rhythm.  No rubs, murmurs or gallops heard. ABDOMEN: Obese otherwise benign. MUSCULOSKELETAL: No joint deformity, no clubbing, no edema.  NEUROLOGIC: No overt neurodeficit. SKIN: Intact,warm,dry. PSYCH: Talkative, cooperative.  Discussed PFTs with the patient.  Discussed decline in function since 2015.      Assessment & Plan:     ICD-10-CM   1. Stage 3 severe COPD by GOLD classification (HCC)  J44.9     2. SOB (shortness of breath)  R06.02     3. Personal history of tobacco use, presenting hazards to health  Z87.891 Ambulatory Referral for Lung Cancer Scre      Orders Placed This Encounter  Procedures   Ambulatory Referral for Lung Cancer Scre    Referral Priority:   Routine    Referral Type:   Consultation    Referral Reason:   Specialty Services Required    Number of Visits Requested:   1   Discussion:    Chronic Obstructive Pulmonary Disease (COPD) COPD with a significant 20% decline in lung function since 2015,  likely due to smoking history. Currently using a portable nebulizer with DuoNeb (albuterol  and ipratropium) but reports delayed relief of wheezing. Insurance coverage issues affect access to medications, including budesonide  and Symbicort. Not currently smoking, which is beneficial for her condition. - Continue using the portable nebulizer with DuoNeb. - Check with the pharmacy to determine if nebulized medications can be covered under Part B Medicare. - Reassess lung function annually. - Schedule follow-up appointment in three months. - Refer to lung cancer screening program.     Advised if symptoms do not improve or worsen, to please contact office for sooner follow up or seek emergency care.    I spent 35 minutes of dedicated to the care of this patient on the date of this encounter to include pre-visit review of records, face-to-face time with the patient discussing  conditions above, post visit ordering of testing, clinical documentation with the electronic health record, making appropriate referrals as documented, and communicating necessary findings to members of the patients care team.     C. Leita Sanders, MD Advanced Bronchoscopy PCCM [facility]    *This note was generated using voice recognition software/Dragon and/or AI transcription program.  Despite best efforts to proofread, errors can occur which can change the meaning. Any transcriptional errors that result from this process are unintentional and may not be fully corrected at the time of dictation.

## 2024-07-07 NOTE — Progress Notes (Signed)
 Full PFT completed today ? ?

## 2024-07-08 ENCOUNTER — Telehealth: Payer: Self-pay | Admitting: Pulmonary Disease

## 2024-07-08 NOTE — Telephone Encounter (Signed)
 Patient needs 3 month rov.

## 2024-07-12 NOTE — Telephone Encounter (Signed)
 Patient needs 3 month rov.

## 2024-07-18 ENCOUNTER — Ambulatory Visit: Attending: Internal Medicine

## 2024-07-18 ENCOUNTER — Ambulatory Visit (INDEPENDENT_AMBULATORY_CARE_PROVIDER_SITE_OTHER)

## 2024-07-18 DIAGNOSIS — I739 Peripheral vascular disease, unspecified: Secondary | ICD-10-CM

## 2024-07-19 LAB — VAS US ABI WITH/WO TBI
Left ABI: 0.69
Right ABI: 0.6

## 2024-07-21 ENCOUNTER — Ambulatory Visit: Payer: Self-pay | Admitting: Cardiovascular Disease

## 2024-07-26 ENCOUNTER — Telehealth: Payer: Self-pay | Admitting: Acute Care

## 2024-07-26 DIAGNOSIS — Z87891 Personal history of nicotine dependence: Secondary | ICD-10-CM

## 2024-07-26 DIAGNOSIS — Z122 Encounter for screening for malignant neoplasm of respiratory organs: Secondary | ICD-10-CM

## 2024-07-26 NOTE — Telephone Encounter (Signed)
 Lung Cancer Screening Narrative/Criteria Questionnaire (Cigarette Smokers Only- No Cigars/Pipes/vapes)   [name]   SDMV:*                                            Jul 15, 1951              LDCT: *    73 y.o.   Phone: [phone]  Lung Screening Narrative (confirm age 6-77 yrs Medicare / 50-80 yrs Private pay insurance)   Insurance information:medicare A/B    Referring Provider:Gonzalez   This screening involves an initial phone call with a team member from our program. It is called a shared decision making visit. The initial meeting is required by insurance and Medicare to make sure you understand the program. This appointment takes about 15-20 minutes to complete. The CT scan will completed at a separate date/time. This scan takes about 5-10 minutes to complete and you may eat and drink before and after the scan.  Criteria questions for Lung Cancer Screening:   Are you a current or former smoker? Former Age began smoking: 16y   If you are a former smoker, what year did you quit smoking? Quit 2020   To calculate your smoking history, I need an accurate estimate of how many packs of cigarettes you smoked per day and for how many years. (Not just the number of PPD you are now smoking)   Years smoking 52 x Packs per day 1 = Pack years 52   (at least 20 pack yrs)   (Make sure they understand that we need to know how much they have smoked in the past, not just the number of PPD they are smoking now)  Do you have a personal history of cancer?  No    Do you have a family history of cancer? Adopted  Are you coughing up blood?  No  Have you had unexplained weight loss of 15 lbs or more in the last 6 months? No  It looks like you meet all criteria.     Additional information: N/A

## 2024-07-26 NOTE — Telephone Encounter (Signed)
 Correction - pt had breast cancer over 5 years ago.

## 2024-07-28 NOTE — Telephone Encounter (Signed)
 Reviewed results with patients daughter per release form. She verbalized understanding and will discuss results and recommendations with the patient and will be in touch if she wants to be seen here in our office. No further needs for now.

## 2024-07-28 NOTE — Telephone Encounter (Signed)
 Daughter is returning call to a nurse

## 2024-08-04 ENCOUNTER — Encounter: Payer: Self-pay | Admitting: Urology

## 2024-08-15 ENCOUNTER — Other Ambulatory Visit: Payer: Self-pay | Admitting: Internal Medicine

## 2024-08-25 ENCOUNTER — Encounter: Attending: Physician Assistant | Admitting: Physician Assistant

## 2024-08-25 DIAGNOSIS — L97512 Non-pressure chronic ulcer of other part of right foot with fat layer exposed: Secondary | ICD-10-CM | POA: Insufficient documentation

## 2024-08-25 DIAGNOSIS — S91311A Laceration without foreign body, right foot, initial encounter: Secondary | ICD-10-CM | POA: Insufficient documentation

## 2024-08-25 DIAGNOSIS — X58XXXA Exposure to other specified factors, initial encounter: Secondary | ICD-10-CM | POA: Insufficient documentation

## 2024-08-25 DIAGNOSIS — J449 Chronic obstructive pulmonary disease, unspecified: Secondary | ICD-10-CM | POA: Insufficient documentation

## 2024-08-25 DIAGNOSIS — G603 Idiopathic progressive neuropathy: Secondary | ICD-10-CM | POA: Diagnosis present

## 2024-08-25 DIAGNOSIS — Z7902 Long term (current) use of antithrombotics/antiplatelets: Secondary | ICD-10-CM | POA: Diagnosis not present

## 2024-08-25 DIAGNOSIS — I2510 Atherosclerotic heart disease of native coronary artery without angina pectoris: Secondary | ICD-10-CM | POA: Insufficient documentation

## 2024-09-01 ENCOUNTER — Telehealth: Payer: Self-pay

## 2024-09-01 NOTE — Telephone Encounter (Signed)
 Copied from CRM #[id number]. Topic: Clinical - Prescription Issue >> Sep 01, 2024  2:48 PM Joesph PARAS wrote: Reason for CRM: Pharmacy calling. States that generic budesonide  (PULMICORT ) inhaler is rejecting diagnosis code when trying to file under Medicare Part B. Elvie requesting a new diagnosis code. Please c/b at [phone].

## 2024-09-01 NOTE — Telephone Encounter (Signed)
 I spoke with the pharmacy and gave them the J44.9 code. The prescription went through.  Nothing further needed.

## 2024-09-02 ENCOUNTER — Encounter: Admitting: Physician Assistant

## 2024-09-02 DIAGNOSIS — G603 Idiopathic progressive neuropathy: Secondary | ICD-10-CM | POA: Diagnosis not present

## 2024-09-05 NOTE — Progress Notes (Signed)
   09/12/2024 1:51 PM   [name] 05-13-1951 [id number]  Reason for visit: Follow up nephrolithiasis   HPI: 73 year old female here for initial follow up with me, previously saw Dr. 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   Laboratory Data: N/A  Pertinent Imaging: N/A   Assessment & Plan:    History of kidney stones Assessment & Plan: Hx of nephrolithiasis and chronic ESBLW infection  S/p prior Right URS/LL for chronically infected stone (July 2024) RBUS (Sept 2024) - no hydro, mild Right atrophy and cortical thinning, no residual stones  Today we reviewed the basic tenets of kidney stone management and prevention. We reviewed five broad recommendations:   - Adequate fluid intake >=2.5 L/day (goal urine output >=2 L/day). - Maintenance of normal dietary calcium (1,000-1,200 mg/day), avoid excess calcium supplements - Reduction of sodium intake (<2 g/day) to lower urinary calcium excretion. - Moderation of oxalate-rich foods (nuts, spinach, beets, chocolate, tea)  - Limit animal protein (meat, fish, poultry); promote balanced diet with fruits/vegetables.  - KUB next available, f/u in 1 year with KUB prior -  if stone free for extended period, we can increase imaging interval vs forgo until clinically indicated    Orders: -     DG Abd 1 View; Future       Penne JONELLE Skye, MD  [facility] [address] [place], [place] [address] [phone]

## 2024-09-05 NOTE — Assessment & Plan Note (Addendum)
 Hx of nephrolithiasis and chronic ESBLW infection  S/p prior Right URS/LL for chronically infected stone (July 2024) RBUS (Sept 2024) - no hydro, mild Right atrophy and cortical thinning, no residual stones  Today we reviewed the basic tenets of kidney stone management and prevention. We reviewed five broad recommendations:   - Adequate fluid intake >=2.5 L/day (goal urine output >=2 L/day). - Maintenance of normal dietary calcium (1,000-1,200 mg/day), avoid excess calcium supplements - Reduction of sodium intake (<2 g/day) to lower urinary calcium excretion. - Moderation of oxalate-rich foods (nuts, spinach, beets, chocolate, tea)  - Limit animal protein (meat, fish, poultry); promote balanced diet with fruits/vegetables.  - KUB next available, f/u in 1 year with KUB prior - if stone free for extended period, we can increase imaging interval vs forgo until clinically indicated

## 2024-09-12 ENCOUNTER — Ambulatory Visit: Admitting: Urology

## 2024-09-12 VITALS — BP 135/80 | HR 92 | Ht 63.0 in | Wt 180.0 lb

## 2024-09-12 DIAGNOSIS — Z87442 Personal history of urinary calculi: Secondary | ICD-10-CM | POA: Diagnosis not present

## 2024-09-12 NOTE — Patient Instructions (Addendum)
 Today we reviewed the basic tenets of kidney stone management and prevention. We reviewed five broad recommendations:   - Adequate fluid intake >=2.5 L/day (goal urine output >=2 L/day). - Maintenance of normal dietary calcium (1,000-1,200 mg/day), avoid excess calcium supplements - Reduction of sodium intake (<2 g/day) to lower urinary calcium excretion. - Moderation of oxalate-rich foods (nuts, spinach, beets, chocolate, tea)  - Limit animal protein (meat, fish, poultry); promote balanced diet with fruits/vegetables.   Please make sure to have KUB done prior to your next appointment.

## 2024-09-13 ENCOUNTER — Ambulatory Visit: Payer: Self-pay | Admitting: Urology

## 2024-09-13 ENCOUNTER — Other Ambulatory Visit: Payer: Self-pay | Admitting: Internal Medicine

## 2024-09-16 ENCOUNTER — Encounter: Admitting: Physician Assistant

## 2024-09-16 DIAGNOSIS — G603 Idiopathic progressive neuropathy: Secondary | ICD-10-CM | POA: Diagnosis not present

## 2024-10-03 ENCOUNTER — Other Ambulatory Visit: Payer: Self-pay | Admitting: Internal Medicine

## 2024-10-07 ENCOUNTER — Encounter: Attending: Physician Assistant | Admitting: Physician Assistant

## 2024-10-07 DIAGNOSIS — G603 Idiopathic progressive neuropathy: Secondary | ICD-10-CM | POA: Diagnosis not present

## 2024-10-07 DIAGNOSIS — Z872 Personal history of diseases of the skin and subcutaneous tissue: Secondary | ICD-10-CM | POA: Insufficient documentation

## 2024-10-07 DIAGNOSIS — J449 Chronic obstructive pulmonary disease, unspecified: Secondary | ICD-10-CM | POA: Diagnosis not present

## 2024-10-07 DIAGNOSIS — Z7901 Long term (current) use of anticoagulants: Secondary | ICD-10-CM | POA: Insufficient documentation

## 2024-10-07 DIAGNOSIS — I2510 Atherosclerotic heart disease of native coronary artery without angina pectoris: Secondary | ICD-10-CM | POA: Insufficient documentation

## 2024-10-07 DIAGNOSIS — Z09 Encounter for follow-up examination after completed treatment for conditions other than malignant neoplasm: Secondary | ICD-10-CM | POA: Insufficient documentation

## 2024-12-01 ENCOUNTER — Ambulatory Visit: Admitting: Acute Care

## 2024-12-01 DIAGNOSIS — Z87891 Personal history of nicotine dependence: Secondary | ICD-10-CM

## 2024-12-01 DIAGNOSIS — Z122 Encounter for screening for malignant neoplasm of respiratory organs: Secondary | ICD-10-CM

## 2024-12-01 NOTE — Progress Notes (Signed)
 Virtual Visit via Telephone Note  I connected with [name] on 12/01/2024 at  4:00 PM EST by telephone and verified that I am speaking with the correct person using two identifiers.  Location: Patient: At home, in [place]  Provider: At home, in [place]    I discussed the limitations, risks, security and privacy concerns of performing an evaluation and management service by telephone and the availability of in person appointments. I also discussed with the patient that there may be a patient responsible charge related to this service. The patient expressed understanding and agreed to proceed.  Shared Decision Making Visit Lung Cancer Screening Program [phone])   Eligibility: Age 73 y.o. Pack Years Smoking History Calculation 52 pack years  (# packs/per year x # years smoked) Recent History of coughing up blood  no Unexplained weight loss? no ( >Than 15 pounds within the last 6 months ) Prior History Lung / other cancer no (Diagnosis within the last 5 years already requiring surveillance chest CT Scans). Smoking Status Former Smoker Former Smokers: Years since quit: 5 years  Quit Date: 2020  Visit Components: Discussion included one or more decision making aids. yes Discussion included risk/benefits of screening. yes Discussion included potential follow up diagnostic testing for abnormal scans. yes Discussion included meaning and risk of over diagnosis. yes Discussion included meaning and risk of False Positives. yes Discussion included meaning of total radiation exposure. yes  Counseling Included: Importance of adherence to annual lung cancer LDCT screening. yes Impact of comorbidities on ability to participate in the program. yes Ability and willingness to under diagnostic treatment. yes  Smoking Cessation Counseling: Current Smokers:  Discussed importance of smoking cessation. N/A Information about tobacco cessation classes and interventions provided to patient. N/A Patient  provided with ticket for LDCT Scan. N/A Symptomatic Patient. N/A  Counseling Diagnosis Code: Tobacco Use Z72.0 Asymptomatic Patient N/A  Counseling Former Smokers:  Discussed the importance of maintaining cigarette abstinence. yes Diagnosis Code: Personal History of Nicotine Dependence. S12.108 Information about tobacco cessation classes and interventions provided to patient. Yes Patient provided with ticket for LDCT Scan. N/A Written Order for Lung Cancer Screening with LDCT placed in Epic. Yes (CT Chest Lung Cancer Screening Low Dose W/O CM) [id number] Z12.2-Screening of respiratory organs Z87.891-Personal history of nicotine dependence   Victoria CHRISTELLA [place], FNP

## 2024-12-01 NOTE — Patient Instructions (Addendum)
 Thank you for participating in the [place] Lung Cancer Screening Program. It was our pleasure to meet you today. We will call you with the results of your scan within the next few days. Your scan will be assigned a Lung RADS category score by the physicians reading the scans.  This Lung RADS score determines follow up scanning.  See below for description of categories, and follow up screening recommendations. We will be in touch to schedule your follow up screening annually or based on recommendations of our providers. We will fax a copy of your scan results to your Primary Care Physician, or the physician who referred you to the program, to ensure they have the results. Please call the office if you have any questions or concerns regarding your scanning experience or results.  Our office number is [phone]. Please speak with Karna Curly, RN., Karna Doom RN, or [facility] RN, and Isaiah Dover RN. They are  our Lung Cancer Screening RN.'s If They are unavailable when you call, Please leave a message on the voice mail. We will return your call at our earliest convenience.This voice mail is monitored several times a day.  Remember, if your scan is normal, we will scan you annually as long as you continue to meet the criteria for the program. (Age 28-80, Current smoker or smoker who has quit within the last 15 years). If you are a former smoker, counselling psychologist. We are proud of you! Remain smoke free! Remember you can refer friends or family members through the number above.  We will screen them to make sure they meet criteria for the program. Thank you for helping us  take better care of you by participating in Lung Screening.  For Virtual Smoking Cessation Classes , The American Lung Association Provides  Freedom From Smoking Classes.  Please search their website for dates and times.    Lung RADS Categories:  Lung RADS 1: no nodules or definitely non-concerning nodules.   Recommendation is for a repeat annual scan in 12 months.  Lung RADS 2:  nodules that are non-concerning in appearance and behavior with a very low likelihood of becoming an active cancer. Recommendation is for a repeat annual scan in 12 months.  Lung RADS 3: nodules that are probably non-concerning , includes nodules with a low likelihood of becoming an active cancer.  Recommendation is for a 4-month repeat screening scan. Often noted after an upper respiratory illness. We will be in touch to make sure you have no questions, and to schedule your 25-month scan.  Lung RADS 4 A: nodules with concerning findings, recommendation is most often for a follow up scan in 3 months or additional testing based on our provider's assessment of the scan. We will be in touch to make sure you have no questions and to schedule the recommended 3 month follow up scan.  Lung RADS 4 B:  indicates findings that are concerning. We will be in touch with you to schedule additional diagnostic testing based on our provider's  assessment of the scan.  You can receive free nicotine replacement therapy ( patches, gum or mints) by calling 1-800-QUIT NOW. Please call so we can get you on the path to becoming  a non-smoker. I know it is hard, but you can do this!  Other options for assistance in smoking cessation ( As covered by your insurance benefits)  Hypnosis for smoking cessation  [employer] [phone]  Acupuncture for smoking cessation  [employer] [phone]  Your CT scan is scheduled for 12/02/2024 at 3:30 pm at [facility] in [place], [place].

## 2024-12-02 ENCOUNTER — Ambulatory Visit: Admission: RE | Admit: 2024-12-02 | Source: Ambulatory Visit

## 2024-12-02 ENCOUNTER — Ambulatory Visit
Admission: RE | Admit: 2024-12-02 | Discharge: 2024-12-02 | Disposition: A | Source: Ambulatory Visit | Attending: Acute Care | Admitting: Acute Care

## 2024-12-02 ENCOUNTER — Ambulatory Visit
Admission: RE | Admit: 2024-12-02 | Discharge: 2024-12-02 | Disposition: A | Source: Home / Self Care | Attending: Urology | Admitting: Urology

## 2024-12-02 ENCOUNTER — Ambulatory Visit
Admission: RE | Admit: 2024-12-02 | Discharge: 2024-12-02 | Disposition: A | Source: Ambulatory Visit | Attending: Urology

## 2024-12-02 DIAGNOSIS — Z122 Encounter for screening for malignant neoplasm of respiratory organs: Secondary | ICD-10-CM

## 2024-12-02 DIAGNOSIS — Z87442 Personal history of urinary calculi: Secondary | ICD-10-CM

## 2024-12-02 DIAGNOSIS — Z87891 Personal history of nicotine dependence: Secondary | ICD-10-CM

## 2024-12-08 ENCOUNTER — Other Ambulatory Visit: Payer: Self-pay

## 2024-12-08 DIAGNOSIS — Z122 Encounter for screening for malignant neoplasm of respiratory organs: Secondary | ICD-10-CM

## 2024-12-08 DIAGNOSIS — Z87891 Personal history of nicotine dependence: Secondary | ICD-10-CM

## 2024-12-13 ENCOUNTER — Other Ambulatory Visit: Payer: Self-pay | Admitting: Internal Medicine

## 2024-12-27 ENCOUNTER — Other Ambulatory Visit: Payer: Self-pay

## 2024-12-27 MED ORDER — BUDESONIDE 0.25 MG/2ML IN SUSP: 0.2500 mg | Freq: Two times a day (BID) | RESPIRATORY_TRACT | 0 refills | Status: AC

## 2025-09-14 ENCOUNTER — Ambulatory Visit: Admitting: Urology
